# Patient Record
Sex: Male | Born: 1955 | Race: White | Hispanic: No | Marital: Single | Smoking: Current some day smoker
Health system: Southern US, Community
[De-identification: ages and names within clinical notes are randomized; demographics above are authoritative.]

## PROBLEM LIST (undated history)

## (undated) DIAGNOSIS — M1990 Unspecified osteoarthritis, unspecified site: Secondary | ICD-10-CM

## (undated) DIAGNOSIS — M6530 Trigger finger, unspecified finger: Secondary | ICD-10-CM

## (undated) DIAGNOSIS — K7460 Unspecified cirrhosis of liver: Secondary | ICD-10-CM

## (undated) DIAGNOSIS — B20 Human immunodeficiency virus [HIV] disease: Secondary | ICD-10-CM

## (undated) DIAGNOSIS — D649 Anemia, unspecified: Secondary | ICD-10-CM

## (undated) DIAGNOSIS — N400 Enlarged prostate without lower urinary tract symptoms: Secondary | ICD-10-CM

## (undated) DIAGNOSIS — N35919 Unspecified urethral stricture, male, unspecified site: Secondary | ICD-10-CM

## (undated) DIAGNOSIS — D61818 Other pancytopenia: Secondary | ICD-10-CM

## (undated) DIAGNOSIS — I10 Essential (primary) hypertension: Secondary | ICD-10-CM

## (undated) DIAGNOSIS — I272 Pulmonary hypertension, unspecified: Secondary | ICD-10-CM

## (undated) DIAGNOSIS — M792 Neuralgia and neuritis, unspecified: Secondary | ICD-10-CM

## (undated) DIAGNOSIS — Z21 Asymptomatic human immunodeficiency virus [HIV] infection status: Secondary | ICD-10-CM

## (undated) DIAGNOSIS — R0902 Hypoxemia: Secondary | ICD-10-CM

## (undated) DIAGNOSIS — B182 Chronic viral hepatitis C: Secondary | ICD-10-CM

## (undated) HISTORY — DX: Benign prostatic hyperplasia without lower urinary tract symptoms: N40.0

## (undated) HISTORY — DX: Anemia, unspecified: D64.9

## (undated) HISTORY — DX: Trigger finger, unspecified finger: M65.30

## (undated) HISTORY — DX: Unspecified cirrhosis of liver: K74.60

## (undated) HISTORY — DX: Essential (primary) hypertension: I10

## (undated) HISTORY — DX: Hypoxemia: R09.02

## (undated) HISTORY — DX: Neuralgia and neuritis, unspecified: M79.2

## (undated) HISTORY — DX: Unspecified urethral stricture, male, unspecified site: N35.919

## (undated) HISTORY — DX: Pulmonary hypertension, unspecified: I27.20

## (undated) HISTORY — DX: Unspecified osteoarthritis, unspecified site: M19.90

## (undated) HISTORY — PX: FRACTURE SURGERY: SHX138

## (undated) HISTORY — DX: Other pancytopenia: D61.818

---

## 2000-11-09 ENCOUNTER — Ambulatory Visit (HOSPITAL_COMMUNITY): Admission: RE | Admit: 2000-11-09 | Discharge: 2000-11-09 | Payer: Self-pay | Admitting: Internal Medicine

## 2000-11-09 ENCOUNTER — Encounter: Admission: RE | Admit: 2000-11-09 | Discharge: 2000-11-09 | Payer: Self-pay | Admitting: Internal Medicine

## 2000-11-20 ENCOUNTER — Encounter: Admission: RE | Admit: 2000-11-20 | Discharge: 2000-11-20 | Payer: Self-pay | Admitting: Infectious Diseases

## 2000-12-04 ENCOUNTER — Encounter: Admission: RE | Admit: 2000-12-04 | Discharge: 2000-12-04 | Payer: Self-pay | Admitting: Infectious Diseases

## 2000-12-04 ENCOUNTER — Ambulatory Visit (HOSPITAL_COMMUNITY): Admission: RE | Admit: 2000-12-04 | Discharge: 2000-12-04 | Payer: Self-pay | Admitting: Internal Medicine

## 2000-12-11 ENCOUNTER — Encounter: Admission: RE | Admit: 2000-12-11 | Discharge: 2000-12-11 | Payer: Self-pay | Admitting: Infectious Diseases

## 2000-12-11 ENCOUNTER — Encounter: Payer: Self-pay | Admitting: Infectious Diseases

## 2000-12-11 ENCOUNTER — Ambulatory Visit (HOSPITAL_COMMUNITY): Admission: RE | Admit: 2000-12-11 | Discharge: 2000-12-11 | Payer: Self-pay | Admitting: Infectious Diseases

## 2000-12-12 ENCOUNTER — Ambulatory Visit (HOSPITAL_COMMUNITY): Admission: RE | Admit: 2000-12-12 | Discharge: 2000-12-12 | Payer: Self-pay | Admitting: Infectious Diseases

## 2000-12-12 ENCOUNTER — Encounter: Payer: Self-pay | Admitting: Infectious Diseases

## 2001-01-02 ENCOUNTER — Ambulatory Visit (HOSPITAL_COMMUNITY): Admission: RE | Admit: 2001-01-02 | Discharge: 2001-01-02 | Payer: Self-pay | Admitting: Infectious Diseases

## 2001-01-02 ENCOUNTER — Encounter: Admission: RE | Admit: 2001-01-02 | Discharge: 2001-01-02 | Payer: Self-pay | Admitting: Infectious Diseases

## 2001-01-30 ENCOUNTER — Encounter: Admission: RE | Admit: 2001-01-30 | Discharge: 2001-01-30 | Payer: Self-pay | Admitting: Infectious Diseases

## 2001-02-08 ENCOUNTER — Ambulatory Visit (HOSPITAL_COMMUNITY): Admission: RE | Admit: 2001-02-08 | Discharge: 2001-02-08 | Payer: Self-pay | Admitting: Infectious Diseases

## 2001-04-10 ENCOUNTER — Encounter: Admission: RE | Admit: 2001-04-10 | Discharge: 2001-04-10 | Payer: Self-pay | Admitting: Infectious Diseases

## 2001-05-02 ENCOUNTER — Encounter: Admission: RE | Admit: 2001-05-02 | Discharge: 2001-05-02 | Payer: Self-pay | Admitting: Infectious Diseases

## 2001-05-02 ENCOUNTER — Ambulatory Visit (HOSPITAL_COMMUNITY): Admission: RE | Admit: 2001-05-02 | Discharge: 2001-05-02 | Payer: Self-pay | Admitting: Infectious Diseases

## 2001-05-20 ENCOUNTER — Encounter: Payer: Self-pay | Admitting: Emergency Medicine

## 2001-05-20 ENCOUNTER — Inpatient Hospital Stay (HOSPITAL_COMMUNITY): Admission: EM | Admit: 2001-05-20 | Discharge: 2001-05-20 | Payer: Self-pay | Admitting: Emergency Medicine

## 2001-07-02 ENCOUNTER — Encounter: Admission: RE | Admit: 2001-07-02 | Discharge: 2001-07-02 | Payer: Self-pay | Admitting: Infectious Diseases

## 2001-07-02 ENCOUNTER — Ambulatory Visit (HOSPITAL_COMMUNITY): Admission: RE | Admit: 2001-07-02 | Discharge: 2001-07-02 | Payer: Self-pay | Admitting: Infectious Diseases

## 2001-07-12 ENCOUNTER — Encounter: Admission: RE | Admit: 2001-07-12 | Discharge: 2001-07-12 | Payer: Self-pay | Admitting: Internal Medicine

## 2001-09-04 ENCOUNTER — Ambulatory Visit (HOSPITAL_COMMUNITY): Admission: RE | Admit: 2001-09-04 | Discharge: 2001-09-04 | Payer: Self-pay | Admitting: Internal Medicine

## 2001-09-04 ENCOUNTER — Encounter: Admission: RE | Admit: 2001-09-04 | Discharge: 2001-09-04 | Payer: Self-pay | Admitting: Infectious Diseases

## 2001-09-09 ENCOUNTER — Encounter: Payer: Self-pay | Admitting: Infectious Diseases

## 2001-09-09 ENCOUNTER — Ambulatory Visit (HOSPITAL_COMMUNITY): Admission: RE | Admit: 2001-09-09 | Discharge: 2001-09-09 | Payer: Self-pay | Admitting: Infectious Diseases

## 2001-10-18 ENCOUNTER — Encounter: Admission: RE | Admit: 2001-10-18 | Discharge: 2001-10-18 | Payer: Self-pay | Admitting: Infectious Diseases

## 2001-11-12 ENCOUNTER — Encounter: Admission: RE | Admit: 2001-11-12 | Discharge: 2001-11-12 | Payer: Self-pay | Admitting: Infectious Diseases

## 2001-11-12 ENCOUNTER — Ambulatory Visit (HOSPITAL_COMMUNITY): Admission: RE | Admit: 2001-11-12 | Discharge: 2001-11-12 | Payer: Self-pay | Admitting: Infectious Diseases

## 2002-01-15 ENCOUNTER — Encounter: Admission: RE | Admit: 2002-01-15 | Discharge: 2002-01-15 | Payer: Self-pay | Admitting: Infectious Diseases

## 2002-01-15 ENCOUNTER — Ambulatory Visit (HOSPITAL_COMMUNITY): Admission: RE | Admit: 2002-01-15 | Discharge: 2002-01-15 | Payer: Self-pay | Admitting: Infectious Diseases

## 2002-03-19 ENCOUNTER — Ambulatory Visit (HOSPITAL_COMMUNITY): Admission: RE | Admit: 2002-03-19 | Discharge: 2002-03-19 | Payer: Self-pay | Admitting: Infectious Diseases

## 2002-03-19 ENCOUNTER — Encounter: Admission: RE | Admit: 2002-03-19 | Discharge: 2002-03-19 | Payer: Self-pay | Admitting: Infectious Diseases

## 2002-05-19 ENCOUNTER — Encounter: Admission: RE | Admit: 2002-05-19 | Discharge: 2002-05-19 | Payer: Self-pay | Admitting: Infectious Diseases

## 2002-06-30 ENCOUNTER — Encounter: Admission: RE | Admit: 2002-06-30 | Discharge: 2002-06-30 | Payer: Self-pay | Admitting: Infectious Diseases

## 2002-06-30 ENCOUNTER — Ambulatory Visit (HOSPITAL_COMMUNITY): Admission: RE | Admit: 2002-06-30 | Discharge: 2002-06-30 | Payer: Self-pay | Admitting: Infectious Diseases

## 2002-08-23 ENCOUNTER — Emergency Department (HOSPITAL_COMMUNITY): Admission: EM | Admit: 2002-08-23 | Discharge: 2002-08-23 | Payer: Self-pay | Admitting: Emergency Medicine

## 2002-09-10 ENCOUNTER — Encounter: Admission: RE | Admit: 2002-09-10 | Discharge: 2002-09-10 | Payer: Self-pay | Admitting: Infectious Diseases

## 2002-11-10 ENCOUNTER — Encounter: Admission: RE | Admit: 2002-11-10 | Discharge: 2002-11-10 | Payer: Self-pay | Admitting: Infectious Diseases

## 2002-11-26 ENCOUNTER — Encounter: Admission: RE | Admit: 2002-11-26 | Discharge: 2002-11-26 | Payer: Self-pay | Admitting: Infectious Diseases

## 2003-02-04 ENCOUNTER — Ambulatory Visit (HOSPITAL_COMMUNITY): Admission: RE | Admit: 2003-02-04 | Discharge: 2003-02-04 | Payer: Self-pay | Admitting: Infectious Diseases

## 2003-02-04 ENCOUNTER — Encounter: Admission: RE | Admit: 2003-02-04 | Discharge: 2003-02-04 | Payer: Self-pay | Admitting: Infectious Diseases

## 2003-05-06 ENCOUNTER — Encounter: Admission: RE | Admit: 2003-05-06 | Discharge: 2003-05-06 | Payer: Self-pay | Admitting: Infectious Diseases

## 2015-06-24 ENCOUNTER — Telehealth: Payer: Self-pay | Admitting: Family Medicine

## 2015-06-24 NOTE — Telephone Encounter (Signed)
The patient will have to be seen by a provider before any prescriptions can be given or refilled. Please get the patient in with a provider and then appropriate prescriptions can be written. He needs to be seen by someone who can follow him regularly

## 2015-06-24 NOTE — Telephone Encounter (Signed)
Apt made
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
## 2015-06-25 ENCOUNTER — Encounter: Payer: Self-pay | Admitting: Pediatrics

## 2015-06-25 ENCOUNTER — Ambulatory Visit (INDEPENDENT_AMBULATORY_CARE_PROVIDER_SITE_OTHER): Payer: Self-pay | Admitting: Pediatrics

## 2015-06-25 VITALS — BP 118/80 | HR 82 | Temp 98.0°F | Ht 66.0 in | Wt 160.0 lb

## 2015-06-25 DIAGNOSIS — K7460 Unspecified cirrhosis of liver: Secondary | ICD-10-CM

## 2015-06-25 DIAGNOSIS — B20 Human immunodeficiency virus [HIV] disease: Secondary | ICD-10-CM

## 2015-06-25 DIAGNOSIS — N400 Enlarged prostate without lower urinary tract symptoms: Secondary | ICD-10-CM

## 2015-06-25 DIAGNOSIS — I272 Other secondary pulmonary hypertension: Secondary | ICD-10-CM

## 2015-06-25 DIAGNOSIS — G8929 Other chronic pain: Secondary | ICD-10-CM

## 2015-06-25 DIAGNOSIS — B1920 Unspecified viral hepatitis C without hepatic coma: Secondary | ICD-10-CM

## 2015-06-25 DIAGNOSIS — F1129 Opioid dependence with unspecified opioid-induced disorder: Secondary | ICD-10-CM

## 2015-06-25 MED ORDER — GABAPENTIN 300 MG PO CAPS: 600.0000 mg | ORAL_CAPSULE | Freq: Three times a day (TID) | ORAL | Status: DC

## 2015-06-25 MED ORDER — SILDENAFIL CITRATE 20 MG PO TABS: 40.0000 mg | ORAL_TABLET | Freq: Three times a day (TID) | ORAL | Status: DC

## 2015-06-25 MED ORDER — TAMSULOSIN HCL 0.4 MG PO CAPS: 0.4000 mg | ORAL_CAPSULE | Freq: Every day | ORAL | Status: DC

## 2015-06-25 MED ORDER — ABACAVIR-DOLUTEGRAVIR-LAMIVUD 600-50-300 MG PO TABS: 1.0000 | ORAL_TABLET | Freq: Every day | ORAL | Status: DC

## 2015-06-25 MED ORDER — OXYCODONE HCL 10 MG PO TABS: 10.0000 mg | ORAL_TABLET | Freq: Three times a day (TID) | ORAL | Status: DC | PRN

## 2015-06-25 NOTE — Patient Instructions (Signed)
Go to the health department for HIV meds
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
## 2015-06-26 DIAGNOSIS — B20 Human immunodeficiency virus [HIV] disease: Secondary | ICD-10-CM | POA: Insufficient documentation

## 2015-06-26 DIAGNOSIS — N400 Enlarged prostate without lower urinary tract symptoms: Secondary | ICD-10-CM | POA: Insufficient documentation

## 2015-06-26 DIAGNOSIS — B199 Unspecified viral hepatitis without hepatic coma: Secondary | ICD-10-CM | POA: Insufficient documentation

## 2015-06-26 DIAGNOSIS — F1129 Opioid dependence with unspecified opioid-induced disorder: Secondary | ICD-10-CM | POA: Insufficient documentation

## 2015-06-26 DIAGNOSIS — K7460 Unspecified cirrhosis of liver: Secondary | ICD-10-CM | POA: Insufficient documentation

## 2015-06-26 DIAGNOSIS — I272 Pulmonary hypertension, unspecified: Secondary | ICD-10-CM | POA: Insufficient documentation

## 2015-06-26 DIAGNOSIS — G8929 Other chronic pain: Secondary | ICD-10-CM | POA: Insufficient documentation

## 2015-07-22 ENCOUNTER — Ambulatory Visit (INDEPENDENT_AMBULATORY_CARE_PROVIDER_SITE_OTHER): Payer: Self-pay | Admitting: Pediatrics

## 2015-07-22 ENCOUNTER — Encounter: Payer: Self-pay | Admitting: Pediatrics

## 2015-07-22 VITALS — BP 119/77 | HR 85 | Temp 97.1°F | Ht 66.0 in | Wt 160.2 lb

## 2015-07-22 DIAGNOSIS — I272 Other secondary pulmonary hypertension: Secondary | ICD-10-CM

## 2015-07-22 DIAGNOSIS — F1129 Opioid dependence with unspecified opioid-induced disorder: Secondary | ICD-10-CM

## 2015-07-22 MED ORDER — SILDENAFIL CITRATE 20 MG PO TABS: 20.0000 mg | ORAL_TABLET | Freq: Three times a day (TID) | ORAL | Status: DC

## 2015-07-22 MED ORDER — OXYCODONE HCL 10 MG PO TABS: 10.0000 mg | ORAL_TABLET | Freq: Three times a day (TID) | ORAL | Status: DC | PRN
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
## 2015-07-23 ENCOUNTER — Telehealth: Payer: Self-pay | Admitting: Pediatrics

## 2015-07-23 ENCOUNTER — Institutional Professional Consult (permissible substitution): Payer: Self-pay | Admitting: Internal Medicine

## 2015-07-24 NOTE — Telephone Encounter (Signed)
Tried to get through to person, phone kept ringing, no one picked up.

## 2015-08-19 ENCOUNTER — Ambulatory Visit (INDEPENDENT_AMBULATORY_CARE_PROVIDER_SITE_OTHER): Payer: Self-pay | Admitting: Pediatrics

## 2015-08-19 ENCOUNTER — Encounter: Payer: Self-pay | Admitting: Pediatrics

## 2015-08-19 VITALS — BP 128/81 | HR 86 | Temp 97.2°F | Ht 66.0 in | Wt 155.0 lb

## 2015-08-19 DIAGNOSIS — I272 Other secondary pulmonary hypertension: Secondary | ICD-10-CM

## 2015-08-19 DIAGNOSIS — F1129 Opioid dependence with unspecified opioid-induced disorder: Secondary | ICD-10-CM

## 2015-08-19 DIAGNOSIS — B20 Human immunodeficiency virus [HIV] disease: Secondary | ICD-10-CM

## 2015-08-19 MED ORDER — OXYCODONE HCL 10 MG PO TABS: 10.0000 mg | ORAL_TABLET | Freq: Three times a day (TID) | ORAL | Status: DC | PRN

## 2015-08-19 MED ORDER — SILDENAFIL CITRATE 20 MG PO TABS: 20.0000 mg | ORAL_TABLET | Freq: Three times a day (TID) | ORAL | Status: DC
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
## 2015-08-20 ENCOUNTER — Other Ambulatory Visit: Payer: Self-pay | Admitting: Pediatrics

## 2015-08-20 NOTE — Telephone Encounter (Signed)
Coupon card left up front in brown bag where samples are

## 2015-08-25 ENCOUNTER — Telehealth: Payer: Self-pay | Admitting: *Deleted

## 2015-08-25 NOTE — Telephone Encounter (Signed)
Called the patient to try and schedule a transfere appt for labs and to see the doctor had to leave a message as he was unavailable. Will schedule when he calls back.

## 2015-09-02 ENCOUNTER — Telehealth: Payer: Self-pay | Admitting: Pediatrics

## 2015-09-02 NOTE — Telephone Encounter (Signed)
Spoke with patient assistance program for meds of Casey Pennington, gave them a medicine list to see what assistance is available to pt. Saw phone message from ID clinic, called pt, gave him ID clinic phone number, says he never got a message from them. Best contact [phone].

## 2015-09-03 NOTE — Telephone Encounter (Signed)
Several attempts have been made to contact patient.

## 2015-09-13 ENCOUNTER — Encounter: Payer: Self-pay | Admitting: Pediatrics

## 2015-09-13 ENCOUNTER — Ambulatory Visit (INDEPENDENT_AMBULATORY_CARE_PROVIDER_SITE_OTHER): Payer: Medicaid Other | Admitting: Pediatrics

## 2015-09-13 VITALS — BP 128/88 | HR 92 | Temp 97.3°F | Ht 66.0 in | Wt 150.4 lb

## 2015-09-13 DIAGNOSIS — F1129 Opioid dependence with unspecified opioid-induced disorder: Secondary | ICD-10-CM | POA: Diagnosis not present

## 2015-09-13 DIAGNOSIS — I272 Other secondary pulmonary hypertension: Secondary | ICD-10-CM

## 2015-09-13 DIAGNOSIS — G894 Chronic pain syndrome: Secondary | ICD-10-CM | POA: Diagnosis not present

## 2015-09-13 DIAGNOSIS — N400 Enlarged prostate without lower urinary tract symptoms: Secondary | ICD-10-CM

## 2015-09-13 DIAGNOSIS — B20 Human immunodeficiency virus [HIV] disease: Secondary | ICD-10-CM

## 2015-09-13 LAB — BAYER DCA HB A1C WAIVED: HB A1C (BAYER DCA - WAIVED): 5.4 % (ref <7.0)

## 2015-09-13 MED ORDER — GABAPENTIN 600 MG PO TABS: 600.0000 mg | ORAL_TABLET | Freq: Three times a day (TID) | ORAL | Status: DC

## 2015-09-13 MED ORDER — OXYCODONE HCL 10 MG PO TABS: 10.0000 mg | ORAL_TABLET | Freq: Three times a day (TID) | ORAL | Status: DC | PRN

## 2015-09-13 MED ORDER — BUPROPION HCL ER (XL) 150 MG PO TB24: 150.0000 mg | ORAL_TABLET | Freq: Every day | ORAL | Status: AC

## 2015-09-13 MED ORDER — SILDENAFIL CITRATE 20 MG PO TABS: 20.0000 mg | ORAL_TABLET | Freq: Three times a day (TID) | ORAL | Status: DC

## 2015-09-13 MED ORDER — ABACAVIR-DOLUTEGRAVIR-LAMIVUD 600-50-300 MG PO TABS: 1.0000 | ORAL_TABLET | Freq: Every day | ORAL | Status: AC

## 2015-09-13 MED ORDER — TAMSULOSIN HCL 0.4 MG PO CAPS: 0.4000 mg | ORAL_CAPSULE | Freq: Every day | ORAL | Status: AC
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
## 2015-09-14 ENCOUNTER — Telehealth: Payer: Self-pay

## 2015-09-14 DIAGNOSIS — G8929 Other chronic pain: Secondary | ICD-10-CM

## 2015-09-14 NOTE — Telephone Encounter (Signed)
Medicaid non preferred Gabapentin   Preferred are Clonazepam , Diastar, Gabapentin capsule Gabitril, lamotrigine chewable levetiracetam tablet, Topiragen tablet topiramate asule ot tablet and zonisamide capsule

## 2015-09-15 ENCOUNTER — Telehealth: Payer: Self-pay | Admitting: Pediatrics

## 2015-09-15 DIAGNOSIS — I272 Pulmonary hypertension, unspecified: Secondary | ICD-10-CM

## 2015-09-15 DIAGNOSIS — B20 Human immunodeficiency virus [HIV] disease: Secondary | ICD-10-CM

## 2015-09-15 DIAGNOSIS — K7460 Unspecified cirrhosis of liver: Secondary | ICD-10-CM

## 2015-09-15 LAB — CMP14+EGFR
ALT: 11 IU/L (ref 0–44)
AST: 23 IU/L (ref 0–40)
Albumin/Globulin Ratio: 0.9 — ABNORMAL LOW (ref 1.2–2.2)
Albumin: 4.7 g/dL (ref 3.5–5.5)
Alkaline Phosphatase: 102 IU/L (ref 39–117)
BILIRUBIN TOTAL: 0.6 mg/dL (ref 0.0–1.2)
BUN/Creatinine Ratio: 15 (ref 9–20)
BUN: 16 mg/dL (ref 6–24)
CALCIUM: 9.3 mg/dL (ref 8.7–10.2)
CO2: 16 mmol/L — ABNORMAL LOW (ref 18–29)
Chloride: 107 mmol/L — ABNORMAL HIGH (ref 96–106)
Creatinine, Ser: 1.09 mg/dL (ref 0.76–1.27)
GFR, EST AFRICAN AMERICAN: 86 mL/min/{1.73_m2} (ref >59)
GFR, EST NON AFRICAN AMERICAN: 74 mL/min/{1.73_m2} (ref >59)
GLUCOSE: 100 mg/dL — AB (ref 65–99)
Globulin, Total: 5.3 g/dL — ABNORMAL HIGH (ref 1.5–4.5)
POTASSIUM: 4.9 mmol/L (ref 3.5–5.2)
Sodium: 144 mmol/L (ref 134–144)
TOTAL PROTEIN: 10 g/dL — AB (ref 6.0–8.5)

## 2015-09-15 LAB — CBC
HEMATOCRIT: 42.9 % (ref 37.5–51.0)
HEMOGLOBIN: 14.5 g/dL (ref 12.6–17.7)
MCH: 30.1 pg (ref 26.6–33.0)
MCHC: 33.8 g/dL (ref 31.5–35.7)
MCV: 89 fL (ref 79–97)
PLATELETS: 90 10*3/uL — AB (ref 150–379)
RBC: 4.82 x10E6/uL (ref 4.14–5.80)
RDW: 16.1 % — ABNORMAL HIGH (ref 12.3–15.4)
WBC: 4.4 10*3/uL (ref 3.4–10.8)

## 2015-09-15 LAB — RNA, REAL TIME PCR (GRAPH)
HIV 1 RNA VIRAL LOAD LOG: 5.283 {Log_copies}/mL
HIV-1 RNA Viral Load: 192000 copies/mL

## 2015-09-15 LAB — TSH: TSH: 1.41 u[IU]/mL (ref 0.450–4.500)

## 2015-09-15 MED ORDER — GABAPENTIN 300 MG PO CAPS: 600.0000 mg | ORAL_CAPSULE | Freq: Three times a day (TID) | ORAL | Status: DC

## 2015-09-15 NOTE — Telephone Encounter (Signed)
Pt coming tomorrow for repeat attempt at CD4 count, cancelled at last visit. Records without recent ECHO results for pulm HTN, ordered. Records discuss ESLD 2/2 hep C, h/o varices. Will need repeat walking O2 tomorrow while in clinic.

## 2015-09-15 NOTE — Telephone Encounter (Signed)
D/c'ed tablet gabapentin, put in capsule gabapentin per medicaid preferences.

## 2015-09-16 ENCOUNTER — Ambulatory Visit: Payer: Self-pay | Admitting: Pediatrics

## 2015-09-16 ENCOUNTER — Other Ambulatory Visit: Payer: Self-pay | Admitting: *Deleted

## 2015-09-16 ENCOUNTER — Other Ambulatory Visit: Payer: Self-pay | Admitting: Pediatrics

## 2015-09-16 ENCOUNTER — Other Ambulatory Visit: Payer: Medicaid Other

## 2015-09-16 DIAGNOSIS — B20 Human immunodeficiency virus [HIV] disease: Secondary | ICD-10-CM

## 2015-09-16 DIAGNOSIS — K7460 Unspecified cirrhosis of liver: Secondary | ICD-10-CM

## 2015-09-16 NOTE — Progress Notes (Unsigned)
Walking O2 sats stayed 95-97%. Now on sildenafil 98m TID.

## 2015-09-17 LAB — T-HELPER CELLS (CD4) COUNT (NOT AT ARMC)
% CD 4 POS. LYMPH.: 10.8 % — AB (ref 30.8–58.5)
ABSOLUTE CD 4 HELPER: 248 /uL — AB (ref 359–1519)
BASOS ABS: 0 10*3/uL (ref 0.0–0.2)
BASOS: 0 %
EOS (ABSOLUTE): 0.1 10*3/uL (ref 0.0–0.4)
Eos: 1 %
Hematocrit: 42.6 % (ref 37.5–51.0)
Hemoglobin: 14.4 g/dL (ref 12.6–17.7)
IMMATURE GRANS (ABS): 0 10*3/uL (ref 0.0–0.1)
IMMATURE GRANULOCYTES: 0 %
LYMPHS: 38 %
Lymphocytes Absolute: 2.3 10*3/uL (ref 0.7–3.1)
MCH: 30.6 pg (ref 26.6–33.0)
MCHC: 33.8 g/dL (ref 31.5–35.7)
MCV: 90 fL (ref 79–97)
MONOS ABS: 0.5 10*3/uL (ref 0.1–0.9)
Monocytes: 8 %
NEUTROS PCT: 53 %
Neutrophils Absolute: 3.2 10*3/uL (ref 1.4–7.0)
PLATELETS: 87 10*3/uL — AB (ref 150–379)
RBC: 4.71 x10E6/uL (ref 4.14–5.80)
RDW: 16.3 % — AB (ref 12.3–15.4)
WBC: 6 10*3/uL (ref 3.4–10.8)

## 2015-09-17 LAB — PROTIME-INR
INR: 1.2 (ref 0.8–1.2)
PROTHROMBIN TIME: 12.3 s — AB (ref 9.1–12.0)

## 2015-10-14 ENCOUNTER — Encounter: Payer: Self-pay | Admitting: Family Medicine

## 2015-10-14 ENCOUNTER — Ambulatory Visit: Payer: Medicaid Other | Admitting: Family Medicine

## 2015-10-14 ENCOUNTER — Ambulatory Visit (INDEPENDENT_AMBULATORY_CARE_PROVIDER_SITE_OTHER): Payer: Medicaid Other | Admitting: Family Medicine

## 2015-10-14 ENCOUNTER — Encounter: Payer: Self-pay | Admitting: Pediatrics

## 2015-10-14 VITALS — BP 132/79 | HR 93 | Temp 97.7°F | Ht 67.5 in | Wt 152.2 lb

## 2015-10-14 DIAGNOSIS — I272 Other secondary pulmonary hypertension: Secondary | ICD-10-CM | POA: Diagnosis not present

## 2015-10-14 DIAGNOSIS — B20 Human immunodeficiency virus [HIV] disease: Secondary | ICD-10-CM | POA: Diagnosis not present

## 2015-10-14 DIAGNOSIS — G8929 Other chronic pain: Secondary | ICD-10-CM | POA: Diagnosis not present

## 2015-10-14 DIAGNOSIS — F1129 Opioid dependence with unspecified opioid-induced disorder: Secondary | ICD-10-CM | POA: Diagnosis not present

## 2015-10-14 MED ORDER — OXYCODONE HCL 10 MG PO TABS: 10.0000 mg | ORAL_TABLET | Freq: Three times a day (TID) | ORAL | Status: AC | PRN

## 2015-10-14 MED ORDER — SILDENAFIL CITRATE 20 MG PO TABS: 40.0000 mg | ORAL_TABLET | Freq: Three times a day (TID) | ORAL | Status: AC
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
## 2015-10-14 NOTE — Patient Instructions (Signed)
Great to meet you!  We will cal with labs in less than 1 week  We will work on the pulmonology arrangements in [place] back in 1 month

## 2015-10-15 LAB — CMP14+EGFR
ALK PHOS: 122 IU/L — AB (ref 39–117)
ALT: 19 IU/L (ref 0–44)
AST: 33 IU/L (ref 0–40)
Albumin/Globulin Ratio: 0.8 — ABNORMAL LOW (ref 1.2–2.2)
Albumin: 4.1 g/dL (ref 3.5–5.5)
BILIRUBIN TOTAL: 0.7 mg/dL (ref 0.0–1.2)
BUN / CREAT RATIO: 9 (ref 9–20)
BUN: 10 mg/dL (ref 6–24)
CHLORIDE: 104 mmol/L (ref 96–106)
CO2: 16 mmol/L — AB (ref 18–29)
CREATININE: 1.08 mg/dL (ref 0.76–1.27)
Calcium: 9 mg/dL (ref 8.7–10.2)
GFR calc Af Amer: 87 mL/min/{1.73_m2} (ref >59)
GFR calc non Af Amer: 75 mL/min/{1.73_m2} (ref >59)
GLOBULIN, TOTAL: 4.9 g/dL — AB (ref 1.5–4.5)
GLUCOSE: 111 mg/dL — AB (ref 65–99)
Potassium: 4.2 mmol/L (ref 3.5–5.2)
SODIUM: 141 mmol/L (ref 134–144)
Total Protein: 9 g/dL — ABNORMAL HIGH (ref 6.0–8.5)

## 2015-10-15 LAB — CBC WITH DIFFERENTIAL/PLATELET
BASOS ABS: 0 10*3/uL (ref 0.0–0.2)
Basos: 0 %
EOS (ABSOLUTE): 0.1 10*3/uL (ref 0.0–0.4)
Eos: 2 %
HEMATOCRIT: 38.5 % (ref 37.5–51.0)
Hemoglobin: 13.1 g/dL (ref 12.6–17.7)
Immature Grans (Abs): 0 10*3/uL (ref 0.0–0.1)
Immature Granulocytes: 0 %
LYMPHS ABS: 1.1 10*3/uL (ref 0.7–3.1)
LYMPHS: 24 %
MCH: 31 pg (ref 26.6–33.0)
MCHC: 34 g/dL (ref 31.5–35.7)
MCV: 91 fL (ref 79–97)
MONOS ABS: 0.4 10*3/uL (ref 0.1–0.9)
Monocytes: 8 %
NEUTROS ABS: 2.9 10*3/uL (ref 1.4–7.0)
Neutrophils: 66 %
PLATELETS: 84 10*3/uL — AB (ref 150–379)
RBC: 4.23 x10E6/uL (ref 4.14–5.80)
RDW: 17.8 % — AB (ref 12.3–15.4)
WBC: 4.5 10*3/uL (ref 3.4–10.8)

## 2015-10-21 LAB — TOXASSURE SELECT 13 (MW), URINE: PDF: 0

## 2015-10-26 ENCOUNTER — Telehealth: Payer: Self-pay | Admitting: Family Medicine

## 2015-10-26 NOTE — Telephone Encounter (Signed)
Called and discussed with patient.  I explained that because he has THC, Xanax, and no oxycodone in his urine that we will not be what to prescribe opiates to him any longer.  He was just called by pain management today and offered an appointment 3 days from now. He was not going to go because it's in [place], I have encouraged him to try to find a ride because there are no additional pain clinics in the surrounding towns, he has to go to [place], [place], or [place].  We did previously have one in [place] which is closing.  He understands.  He states he used one of his mother's Xanax is whenever his father had a psychotic episode and he was trying to deal with the stress. He explains that he was exposed to marijuana, does not admit to using, at his nephew's birthday party  Laroy Apple, MD [facility] 10/26/2015, 4:01 PM

## 2015-10-26 NOTE — Telephone Encounter (Signed)
Called to discuss drug screen, however his father explains that he is not out of bed yet.  He recommends calling back after 2:00.  I explained his father is his turn to call his son, did not explain the topic or wire was calling.  Will attempt again.  Laroy Apple, MD [facility] 10/26/2015, 12:07 PM

## 2015-11-01 ENCOUNTER — Ambulatory Visit: Payer: Self-pay | Admitting: Infectious Disease

## 2015-11-08 ENCOUNTER — Other Ambulatory Visit: Payer: Self-pay | Admitting: Pediatrics

## 2015-11-09 ENCOUNTER — Ambulatory Visit: Payer: Medicaid Other | Admitting: Family Medicine

## 2015-11-10 ENCOUNTER — Emergency Department (HOSPITAL_COMMUNITY): Payer: Medicaid Other

## 2015-11-10 ENCOUNTER — Encounter (HOSPITAL_COMMUNITY): Payer: Self-pay | Admitting: Emergency Medicine

## 2015-11-10 ENCOUNTER — Inpatient Hospital Stay (HOSPITAL_COMMUNITY)
Admission: EM | Admit: 2015-11-10 | Discharge: 2015-11-25 | DRG: 969 | Disposition: E | Payer: Medicaid Other | Attending: Internal Medicine | Admitting: Internal Medicine

## 2015-11-10 ENCOUNTER — Encounter: Payer: Self-pay | Admitting: Pediatrics

## 2015-11-10 DIAGNOSIS — Z79899 Other long term (current) drug therapy: Secondary | ICD-10-CM

## 2015-11-10 DIAGNOSIS — B9620 Unspecified Escherichia coli [E. coli] as the cause of diseases classified elsewhere: Secondary | ICD-10-CM | POA: Diagnosis present

## 2015-11-10 DIAGNOSIS — Z8619 Personal history of other infectious and parasitic diseases: Secondary | ICD-10-CM | POA: Diagnosis not present

## 2015-11-10 DIAGNOSIS — R162 Hepatomegaly with splenomegaly, not elsewhere classified: Secondary | ICD-10-CM | POA: Diagnosis present

## 2015-11-10 DIAGNOSIS — D696 Thrombocytopenia, unspecified: Secondary | ICD-10-CM | POA: Diagnosis present

## 2015-11-10 DIAGNOSIS — R64 Cachexia: Secondary | ICD-10-CM | POA: Diagnosis present

## 2015-11-10 DIAGNOSIS — Z21 Asymptomatic human immunodeficiency virus [HIV] infection status: Secondary | ICD-10-CM | POA: Diagnosis not present

## 2015-11-10 DIAGNOSIS — B182 Chronic viral hepatitis C: Secondary | ICD-10-CM | POA: Diagnosis present

## 2015-11-10 DIAGNOSIS — Z23 Encounter for immunization: Secondary | ICD-10-CM | POA: Diagnosis not present

## 2015-11-10 DIAGNOSIS — R6521 Severe sepsis with septic shock: Secondary | ICD-10-CM | POA: Diagnosis present

## 2015-11-10 DIAGNOSIS — K6389 Other specified diseases of intestine: Secondary | ICD-10-CM | POA: Diagnosis present

## 2015-11-10 DIAGNOSIS — F1110 Opioid abuse, uncomplicated: Secondary | ICD-10-CM | POA: Diagnosis present

## 2015-11-10 DIAGNOSIS — R338 Other retention of urine: Secondary | ICD-10-CM | POA: Diagnosis present

## 2015-11-10 DIAGNOSIS — G92 Toxic encephalopathy: Secondary | ICD-10-CM | POA: Diagnosis present

## 2015-11-10 DIAGNOSIS — A4102 Sepsis due to Methicillin resistant Staphylococcus aureus: Secondary | ICD-10-CM | POA: Diagnosis not present

## 2015-11-10 DIAGNOSIS — R739 Hyperglycemia, unspecified: Secondary | ICD-10-CM | POA: Diagnosis present

## 2015-11-10 DIAGNOSIS — N179 Acute kidney failure, unspecified: Secondary | ICD-10-CM | POA: Insufficient documentation

## 2015-11-10 DIAGNOSIS — Z452 Encounter for adjustment and management of vascular access device: Secondary | ICD-10-CM

## 2015-11-10 DIAGNOSIS — R509 Fever, unspecified: Secondary | ICD-10-CM

## 2015-11-10 DIAGNOSIS — K5700 Diverticulitis of small intestine with perforation and abscess without bleeding: Secondary | ICD-10-CM | POA: Diagnosis present

## 2015-11-10 DIAGNOSIS — K7460 Unspecified cirrhosis of liver: Secondary | ICD-10-CM | POA: Diagnosis present

## 2015-11-10 DIAGNOSIS — R740 Nonspecific elevation of levels of transaminase and lactic acid dehydrogenase [LDH]: Secondary | ICD-10-CM | POA: Diagnosis present

## 2015-11-10 DIAGNOSIS — I214 Non-ST elevation (NSTEMI) myocardial infarction: Secondary | ICD-10-CM | POA: Diagnosis not present

## 2015-11-10 DIAGNOSIS — D689 Coagulation defect, unspecified: Secondary | ICD-10-CM | POA: Diagnosis present

## 2015-11-10 DIAGNOSIS — J9690 Respiratory failure, unspecified, unspecified whether with hypoxia or hypercapnia: Secondary | ICD-10-CM

## 2015-11-10 DIAGNOSIS — K559 Vascular disorder of intestine, unspecified: Secondary | ICD-10-CM | POA: Diagnosis present

## 2015-11-10 DIAGNOSIS — N400 Enlarged prostate without lower urinary tract symptoms: Secondary | ICD-10-CM | POA: Diagnosis present

## 2015-11-10 DIAGNOSIS — Z8679 Personal history of other diseases of the circulatory system: Secondary | ICD-10-CM | POA: Insufficient documentation

## 2015-11-10 DIAGNOSIS — R7981 Abnormal blood-gas level: Secondary | ICD-10-CM

## 2015-11-10 DIAGNOSIS — G9340 Encephalopathy, unspecified: Secondary | ICD-10-CM | POA: Insufficient documentation

## 2015-11-10 DIAGNOSIS — Z66 Do not resuscitate: Secondary | ICD-10-CM | POA: Diagnosis present

## 2015-11-10 DIAGNOSIS — R4182 Altered mental status, unspecified: Secondary | ICD-10-CM | POA: Diagnosis present

## 2015-11-10 DIAGNOSIS — F14129 Cocaine abuse with intoxication, unspecified: Secondary | ICD-10-CM | POA: Diagnosis present

## 2015-11-10 DIAGNOSIS — M6282 Rhabdomyolysis: Secondary | ICD-10-CM | POA: Diagnosis present

## 2015-11-10 DIAGNOSIS — R0600 Dyspnea, unspecified: Secondary | ICD-10-CM | POA: Diagnosis not present

## 2015-11-10 DIAGNOSIS — D751 Secondary polycythemia: Secondary | ICD-10-CM | POA: Diagnosis present

## 2015-11-10 DIAGNOSIS — N401 Enlarged prostate with lower urinary tract symptoms: Secondary | ICD-10-CM | POA: Diagnosis present

## 2015-11-10 DIAGNOSIS — F1910 Other psychoactive substance abuse, uncomplicated: Secondary | ICD-10-CM

## 2015-11-10 DIAGNOSIS — I272 Other secondary pulmonary hypertension: Secondary | ICD-10-CM | POA: Diagnosis present

## 2015-11-10 DIAGNOSIS — G8929 Other chronic pain: Secondary | ICD-10-CM | POA: Diagnosis present

## 2015-11-10 DIAGNOSIS — D649 Anemia, unspecified: Secondary | ICD-10-CM | POA: Diagnosis present

## 2015-11-10 DIAGNOSIS — E872 Acidosis: Secondary | ICD-10-CM | POA: Diagnosis present

## 2015-11-10 DIAGNOSIS — K55029 Acute infarction of small intestine, extent unspecified: Secondary | ICD-10-CM

## 2015-11-10 DIAGNOSIS — N390 Urinary tract infection, site not specified: Secondary | ICD-10-CM | POA: Diagnosis present

## 2015-11-10 DIAGNOSIS — Z515 Encounter for palliative care: Secondary | ICD-10-CM | POA: Diagnosis present

## 2015-11-10 DIAGNOSIS — F15129 Other stimulant abuse with intoxication, unspecified: Secondary | ICD-10-CM | POA: Diagnosis present

## 2015-11-10 DIAGNOSIS — R1110 Vomiting, unspecified: Secondary | ICD-10-CM

## 2015-11-10 DIAGNOSIS — E46 Unspecified protein-calorie malnutrition: Secondary | ICD-10-CM | POA: Diagnosis present

## 2015-11-10 DIAGNOSIS — T50905A Adverse effect of unspecified drugs, medicaments and biological substances, initial encounter: Secondary | ICD-10-CM | POA: Diagnosis present

## 2015-11-10 DIAGNOSIS — R451 Restlessness and agitation: Secondary | ICD-10-CM | POA: Diagnosis present

## 2015-11-10 DIAGNOSIS — T796XXD Traumatic ischemia of muscle, subsequent encounter: Secondary | ICD-10-CM | POA: Diagnosis not present

## 2015-11-10 DIAGNOSIS — J9601 Acute respiratory failure with hypoxia: Secondary | ICD-10-CM | POA: Diagnosis not present

## 2015-11-10 DIAGNOSIS — I10 Essential (primary) hypertension: Secondary | ICD-10-CM | POA: Diagnosis present

## 2015-11-10 DIAGNOSIS — Z6820 Body mass index (BMI) 20.0-20.9, adult: Secondary | ICD-10-CM

## 2015-11-10 DIAGNOSIS — K55019 Acute (reversible) ischemia of small intestine, extent unspecified: Secondary | ICD-10-CM | POA: Diagnosis present

## 2015-11-10 DIAGNOSIS — K625 Hemorrhage of anus and rectum: Secondary | ICD-10-CM | POA: Diagnosis not present

## 2015-11-10 DIAGNOSIS — F17210 Nicotine dependence, cigarettes, uncomplicated: Secondary | ICD-10-CM | POA: Diagnosis present

## 2015-11-10 DIAGNOSIS — R34 Anuria and oliguria: Secondary | ICD-10-CM | POA: Diagnosis not present

## 2015-11-10 DIAGNOSIS — I452 Bifascicular block: Secondary | ICD-10-CM | POA: Diagnosis present

## 2015-11-10 DIAGNOSIS — F19929 Other psychoactive substance use, unspecified with intoxication, unspecified: Secondary | ICD-10-CM | POA: Diagnosis present

## 2015-11-10 DIAGNOSIS — J9600 Acute respiratory failure, unspecified whether with hypoxia or hypercapnia: Secondary | ICD-10-CM

## 2015-11-10 DIAGNOSIS — K567 Ileus, unspecified: Secondary | ICD-10-CM | POA: Diagnosis present

## 2015-11-10 DIAGNOSIS — B20 Human immunodeficiency virus [HIV] disease: Secondary | ICD-10-CM | POA: Diagnosis present

## 2015-11-10 DIAGNOSIS — F19922 Other psychoactive substance use, unspecified with intoxication with perceptual disturbance: Secondary | ICD-10-CM

## 2015-11-10 DIAGNOSIS — K7030 Alcoholic cirrhosis of liver without ascites: Secondary | ICD-10-CM | POA: Diagnosis not present

## 2015-11-10 DIAGNOSIS — A419 Sepsis, unspecified organism: Secondary | ICD-10-CM | POA: Diagnosis present

## 2015-11-10 HISTORY — DX: Chronic viral hepatitis C: B18.2

## 2015-11-10 HISTORY — DX: Asymptomatic human immunodeficiency virus (hiv) infection status: Z21

## 2015-11-10 HISTORY — DX: Human immunodeficiency virus (HIV) disease: B20

## 2015-11-10 LAB — CBC
HEMATOCRIT: 54.1 % — AB (ref 39.0–52.0)
HEMOGLOBIN: 18.3 g/dL — AB (ref 13.0–17.0)
MCH: 33.4 pg (ref 26.0–34.0)
MCHC: 33.8 g/dL (ref 30.0–36.0)
MCV: 98.7 fL (ref 78.0–100.0)
Platelets: 122 10*3/uL — ABNORMAL LOW (ref 150–400)
RBC: 5.48 MIL/uL (ref 4.22–5.81)
RDW: 17.2 % — ABNORMAL HIGH (ref 11.5–15.5)
WBC: 19.9 10*3/uL — AB (ref 4.0–10.5)

## 2015-11-10 LAB — HEPATIC FUNCTION PANEL
ALT: 359 U/L — AB (ref 17–63)
AST: 1554 U/L — ABNORMAL HIGH (ref 15–41)
Albumin: 4.1 g/dL (ref 3.5–5.0)
Alkaline Phosphatase: 116 U/L (ref 38–126)
BILIRUBIN INDIRECT: 1.3 mg/dL — AB (ref 0.3–0.9)
Bilirubin, Direct: 0.4 mg/dL (ref 0.1–0.5)
TOTAL PROTEIN: 9 g/dL — AB (ref 6.5–8.1)
Total Bilirubin: 1.7 mg/dL — ABNORMAL HIGH (ref 0.3–1.2)

## 2015-11-10 LAB — RAPID URINE DRUG SCREEN, HOSP PERFORMED
Amphetamines: POSITIVE — AB
Barbiturates: NOT DETECTED
Benzodiazepines: POSITIVE — AB
COCAINE: NOT DETECTED
OPIATES: NOT DETECTED
TETRAHYDROCANNABINOL: POSITIVE — AB

## 2015-11-10 LAB — ETHANOL

## 2015-11-10 LAB — URINALYSIS, ROUTINE W REFLEX MICROSCOPIC
Glucose, UA: NEGATIVE mg/dL
Ketones, ur: 15 mg/dL — AB
NITRITE: POSITIVE — AB
PH: 6.5 (ref 5.0–8.0)
Protein, ur: >300 mg/dL — AB

## 2015-11-10 LAB — SALICYLATE LEVEL

## 2015-11-10 LAB — BASIC METABOLIC PANEL
ANION GAP: 16 — AB (ref 5–15)
BUN: 58 mg/dL — ABNORMAL HIGH (ref 6–20)
CALCIUM: 8.5 mg/dL — AB (ref 8.9–10.3)
CHLORIDE: 114 mmol/L — AB (ref 101–111)
CO2: 12 mmol/L — AB (ref 22–32)
Creatinine, Ser: 3.47 mg/dL — ABNORMAL HIGH (ref 0.61–1.24)
GFR calc non Af Amer: 18 mL/min — ABNORMAL LOW (ref >60)
GFR, EST AFRICAN AMERICAN: 21 mL/min — AB (ref >60)
Glucose, Bld: 100 mg/dL — ABNORMAL HIGH (ref 65–99)
Potassium: 4.7 mmol/L (ref 3.5–5.1)
SODIUM: 142 mmol/L (ref 135–145)

## 2015-11-10 LAB — URINE MICROSCOPIC-ADD ON

## 2015-11-10 LAB — CBG MONITORING, ED: GLUCOSE-CAPILLARY: 98 mg/dL (ref 65–99)

## 2015-11-10 LAB — ACETAMINOPHEN LEVEL

## 2015-11-10 LAB — PROCALCITONIN: Procalcitonin: 1.69 ng/mL

## 2015-11-10 LAB — CK: Total CK: >50000 U/L — ABNORMAL HIGH (ref 49–397)

## 2015-11-10 LAB — LACTIC ACID, PLASMA: LACTIC ACID, VENOUS: 2.2 mmol/L — AB (ref 0.5–2.0)

## 2015-11-10 LAB — TROPONIN I: Troponin I: 8.53 ng/mL (ref <0.031)

## 2015-11-10 MED ORDER — PIPERACILLIN-TAZOBACTAM 3.375 G IVPB 30 MIN
3.3750 g | Freq: Once | INTRAVENOUS | Status: AC
  Administered 2015-11-10: 3.375 g via INTRAVENOUS

## 2015-11-10 MED ORDER — SODIUM CHLORIDE 0.9 % IV BOLUS (SEPSIS)
1000.0000 mL | Freq: Once | INTRAVENOUS | Status: AC
  Administered 2015-11-11: 1000 mL via INTRAVENOUS

## 2015-11-10 MED ORDER — VANCOMYCIN HCL IN DEXTROSE 1-5 GM/200ML-% IV SOLN
1000.0000 mg | Freq: Once | INTRAVENOUS | Status: DC
  Filled 2015-11-10: qty 200

## 2015-11-10 MED ORDER — SODIUM CHLORIDE 0.9% FLUSH
3.0000 mL | Freq: Two times a day (BID) | INTRAVENOUS | Status: DC
  Administered 2015-11-11: 30 mL via INTRAVENOUS
  Administered 2015-11-11: 3 mL via INTRAVENOUS
  Administered 2015-11-12: 10 mL via INTRAVENOUS
  Administered 2015-11-12: 3 mL via INTRAVENOUS
  Administered 2015-11-13: 40 mL via INTRAVENOUS
  Administered 2015-11-13: 3 mL via INTRAVENOUS

## 2015-11-10 MED ORDER — ASPIRIN 300 MG RE SUPP
RECTAL | Status: AC
  Filled 2015-11-10: qty 1

## 2015-11-10 MED ORDER — LORAZEPAM 2 MG/ML IJ SOLN
INTRAMUSCULAR | Status: AC
  Administered 2015-11-10: 2 mg via INTRAVENOUS
  Filled 2015-11-10: qty 1

## 2015-11-10 MED ORDER — M.V.I. ADULT IV INJ
INJECTION | INTRAVENOUS | Status: AC
Start: 2015-11-10 — End: 2015-11-10
  Filled 2015-11-10: qty 10

## 2015-11-10 MED ORDER — LORAZEPAM 2 MG/ML IJ SOLN
1.0000 mg | Freq: Once | INTRAMUSCULAR | Status: AC
  Administered 2015-11-10: 1 mg via INTRAVENOUS
  Filled 2015-11-10: qty 1

## 2015-11-10 MED ORDER — ONDANSETRON HCL 4 MG/2ML IJ SOLN: 4.0000 mg | Freq: Four times a day (QID) | INTRAMUSCULAR | Status: DC | PRN

## 2015-11-10 MED ORDER — VANCOMYCIN HCL IN DEXTROSE 1-5 GM/200ML-% IV SOLN
1000.0000 mg | Freq: Once | INTRAVENOUS | Status: AC
  Administered 2015-11-10: 1000 mg via INTRAVENOUS

## 2015-11-10 MED ORDER — LORAZEPAM 2 MG/ML IJ SOLN
1.0000 mg | Freq: Once | INTRAMUSCULAR | Status: AC
  Administered 2015-11-10: 1 mg via INTRAVENOUS

## 2015-11-10 MED ORDER — SODIUM CHLORIDE 0.9 % IV BOLUS (SEPSIS)
1000.0000 mL | Freq: Once | INTRAVENOUS | Status: AC
  Administered 2015-11-10: 1000 mL via INTRAVENOUS

## 2015-11-10 MED ORDER — LORAZEPAM 2 MG/ML IJ SOLN
2.0000 mg | INTRAMUSCULAR | Status: DC | PRN
  Administered 2015-11-10: 2 mg via INTRAVENOUS

## 2015-11-10 MED ORDER — FOLIC ACID 5 MG/ML IJ SOLN
INTRAMUSCULAR | Status: AC
  Filled 2015-11-10: qty 0.2

## 2015-11-10 MED ORDER — PIPERACILLIN-TAZOBACTAM 3.375 G IVPB 30 MIN
3.3750 g | Freq: Once | INTRAVENOUS | Status: DC
  Filled 2015-11-10: qty 50

## 2015-11-10 MED ORDER — ACETAMINOPHEN 650 MG RE SUPP
650.0000 mg | Freq: Once | RECTAL | Status: AC
  Administered 2015-11-10: 650 mg via RECTAL

## 2015-11-10 MED ORDER — HEPARIN SODIUM (PORCINE) 5000 UNIT/ML IJ SOLN: 5000.0000 [IU] | Freq: Three times a day (TID) | INTRAMUSCULAR | Status: DC

## 2015-11-10 MED ORDER — TAMSULOSIN HCL 0.4 MG PO CAPS
0.4000 mg | ORAL_CAPSULE | Freq: Every day | ORAL | Status: DC
  Filled 2015-11-10: qty 1

## 2015-11-10 MED ORDER — THIAMINE HCL 100 MG/ML IJ SOLN
INTRAMUSCULAR | Status: AC
  Filled 2015-11-10: qty 2

## 2015-11-10 MED ORDER — PIPERACILLIN-TAZOBACTAM 3.375 G IVPB
3.3750 g | Freq: Three times a day (TID) | INTRAVENOUS | Status: DC
  Administered 2015-11-11: 3.375 g via INTRAVENOUS
  Filled 2015-11-10 (×2): qty 50

## 2015-11-10 MED ORDER — SODIUM CHLORIDE 0.9 % IV SOLN
INTRAVENOUS | Status: DC
Start: 2015-11-10 — End: 2015-11-10
  Administered 2015-11-10: 18:00:00 via INTRAVENOUS

## 2015-11-10 MED ORDER — ACETAMINOPHEN 650 MG RE SUPP
RECTAL | Status: AC
  Administered 2015-11-10: 650 mg via RECTAL
  Filled 2015-11-10: qty 1

## 2015-11-10 MED ORDER — THIAMINE HCL 100 MG/ML IJ SOLN
Freq: Once | INTRAVENOUS | Status: AC
  Administered 2015-11-11: via INTRAVENOUS
  Filled 2015-11-10: qty 1000

## 2015-11-10 MED ORDER — LORAZEPAM 2 MG/ML IJ SOLN
INTRAMUSCULAR | Status: AC
  Administered 2015-11-10: 1 mg via INTRAVENOUS
  Filled 2015-11-10: qty 1

## 2015-11-10 MED ORDER — ASPIRIN 300 MG RE SUPP
300.0000 mg | Freq: Once | RECTAL | Status: AC
Start: 2015-11-10 — End: 2015-11-10
  Administered 2015-11-10: 300 mg via RECTAL

## 2015-11-10 MED ORDER — ONDANSETRON HCL 4 MG PO TABS
4.0000 mg | ORAL_TABLET | Freq: Four times a day (QID) | ORAL | Status: DC | PRN
Start: 2015-11-10 — End: 2015-11-11

## 2015-11-10 MED ORDER — ABACAVIR-DOLUTEGRAVIR-LAMIVUD 600-50-300 MG PO TABS: 1.0000 | ORAL_TABLET | Freq: Every day | ORAL | Status: DC

## 2015-11-10 MED ORDER — VANCOMYCIN HCL IN DEXTROSE 750-5 MG/150ML-% IV SOLN: 750.0000 mg | INTRAVENOUS | Status: DC

## 2015-11-10 NOTE — ED Notes (Signed)
Pt returned from xray, agitation continues, iv at left shoulder infiltrated, iv removed, are swollen, bruised, parents at bedside,

## 2015-11-10 NOTE — ED Notes (Signed)
Pt taken to XR.

## 2015-11-10 NOTE — ED Notes (Addendum)
Pt transported to ct, pt continues to be agitated, Dr Myna Hidalgo at bedside,

## 2015-11-10 NOTE — ED Notes (Signed)
Family at bedside. 

## 2015-11-10 NOTE — ED Notes (Signed)
MD at bedside.
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
## 2015-11-10 NOTE — ED Notes (Signed)
Unable to get a BP reading due to pt thrashing around in bed.

## 2015-11-10 NOTE — ED Notes (Addendum)
Dr Myna Hidalgo at bedside,

## 2015-11-10 NOTE — ED Notes (Signed)
Pt returned from xray,
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
## 2015-11-10 NOTE — ED Notes (Addendum)
Per EMS: Pt was at a friends house since [place] and has been shooting up LSD and meth all day. Pt also had crushed up oxycodone and snorted it today.  Pts friend called pt's mother and told her that he was not breathing well.   Pt denies si/hi.  Pt combative on arrival.  Pt has moderate bruising to right hand.  Pt also took 300 mg viagra and 4, 40 mg tablets of gabapentin. Pt also stuck fingers up rectum PTA to pull out fecal matter. Pt reports he has a hard time having bowel movements due to oxycodone use.
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
## 2015-11-10 NOTE — ED Notes (Signed)
Pt has redness noted to back of right hand, unsure of any injury, mom states that pt's hand was not like that when he left her house on Monday,

## 2015-11-10 NOTE — ED Notes (Signed)
[  pt continues to be agitated, Dr Ashok Cordia at bedside,

## 2015-11-10 NOTE — Progress Notes (Addendum)
Pharmacy Note:  Initial antibiotics for Vancomycin and Zosyn ordered by EDP for Sepsis.  Estimated Creatinine Clearance: 22.1 mL/min (by C-G formula based on Cr of 3.47).   No Known Allergies  Filed Vitals:   10/29/2015 1932 10/29/2015 1956  BP:  126/88  Pulse:  119  Temp: 101.1 F (38.4 C)   Resp:  19    Anti-infectives    Start     Dose/Rate Route Frequency Ordered Stop   11/09/2015 2000  vancomycin (VANCOCIN) IVPB 750 mg/150 ml premix     750 mg 150 mL/hr over 60 Minutes Intravenous Every 24 hours 11/24/2015 2128     11/01/2015 1000  abacavir-dolutegravir-lamiVUDine (TRIUMEQ) 178-37-542 MG per tablet 1 tablet     1 tablet Oral Daily 11/04/2015 2112     11/16/2015 0600  piperacillin-tazobactam (ZOSYN) IVPB 3.375 g     3.375 g 12.5 mL/hr over 240 Minutes Intravenous Every 8 hours 11/01/2015 2128     11/19/2015 2130  vancomycin (VANCOCIN) IVPB 1000 mg/200 mL premix     1,000 mg 200 mL/hr over 60 Minutes Intravenous  Once 11/09/2015 2112     11/12/2015 2115  piperacillin-tazobactam (ZOSYN) IVPB 3.375 g     3.375 g 100 mL/hr over 30 Minutes Intravenous  Once 11/18/2015 2112     11/16/2015 2000  piperacillin-tazobactam (ZOSYN) IVPB 3.375 g  Status:  Discontinued    Comments:  Give as soon as cultures drawn   3.375 g 100 mL/hr over 30 Minutes Intravenous  Once 11/19/2015 1953 11/21/2015 2123   11/12/2015 2000  vancomycin (VANCOCIN) IVPB 1000 mg/200 mL premix  Status:  Discontinued    Comments:  Give as soon as cultures drawn   1,000 mg 200 mL/hr over 60 Minutes Intravenous  Once 10/27/2015 1953 10/29/2015 2123      Plan: Initial doses of Vancomycin and Zosyn  X 1 ordered. F/U admission orders for further dosing if therapy continued.  Pricilla Larsson, [facility] 10/31/2015 9:29 PM   Vancomycin and Zosyn to be continued on admission. Zosyn 3.375gm IV every 8 hours. Follow-up micro data, labs, vitals.  Vancomycin 746m IV every 24 hours. Monitor labs, micro and vitals.   HPricilla Larsson [facility] 10/31/2015 9:29  PM

## 2015-11-10 NOTE — ED Notes (Signed)
CRITICAL VALUE ALERT  Critical value received:  Lactic acid 2.2, trop 8.53  Date of notification: 11/08/2015  Time of notification:  22:09  Critical value read back: yes  Nurse who received alert:  Rip Harbour RN   MD notified (1st page):  Dr Myna Hidalgo  Time of first page:  22:08  MD notified (2nd page):  Time of second page:  Responding MD:  Dr Myna Hidalgo  Time MD responded: 22:09

## 2015-11-10 NOTE — ED Notes (Signed)
Pt wanting to stick fingers up rectum to pull out feces.  Pt reports that he isn't able to use the bathroom due to oxycodone use 4 times per day. Pt told that he is not to stick fingers up rectum.  Pt explained that if he continues to do this he will lose sphincter tone.

## 2015-11-10 NOTE — ED Notes (Signed)
Dr Ashok Cordia at bedside,

## 2015-11-10 NOTE — ED Notes (Signed)
Dr Myna Hidalgo  Notified of pt's continued agitation, additional orders given

## 2015-11-10 NOTE — ED Notes (Signed)
mulitples attempt made to start iv, Dr Ashok Cordia aware,

## 2015-11-10 NOTE — ED Notes (Signed)
Received report on pt who remains in xray

## 2015-11-10 NOTE — ED Notes (Signed)
Dr Rosana Hoes at bedside,

## 2015-11-11 ENCOUNTER — Inpatient Hospital Stay (HOSPITAL_COMMUNITY): Payer: Medicaid Other

## 2015-11-11 ENCOUNTER — Inpatient Hospital Stay (HOSPITAL_COMMUNITY): Payer: Medicaid Other | Admitting: Anesthesiology

## 2015-11-11 ENCOUNTER — Encounter (HOSPITAL_COMMUNITY): Payer: Self-pay | Admitting: Certified Registered Nurse Anesthetist

## 2015-11-11 ENCOUNTER — Encounter (HOSPITAL_COMMUNITY): Admission: EM | Disposition: E | Payer: Self-pay | Source: Home / Self Care | Attending: Internal Medicine

## 2015-11-11 DIAGNOSIS — N179 Acute kidney failure, unspecified: Secondary | ICD-10-CM

## 2015-11-11 DIAGNOSIS — R6521 Severe sepsis with septic shock: Secondary | ICD-10-CM

## 2015-11-11 DIAGNOSIS — B182 Chronic viral hepatitis C: Secondary | ICD-10-CM

## 2015-11-11 DIAGNOSIS — R0600 Dyspnea, unspecified: Secondary | ICD-10-CM

## 2015-11-11 DIAGNOSIS — N400 Enlarged prostate without lower urinary tract symptoms: Secondary | ICD-10-CM

## 2015-11-11 DIAGNOSIS — D219 Benign neoplasm of connective and other soft tissue, unspecified: Secondary | ICD-10-CM

## 2015-11-11 DIAGNOSIS — A419 Sepsis, unspecified organism: Secondary | ICD-10-CM | POA: Diagnosis present

## 2015-11-11 DIAGNOSIS — M6282 Rhabdomyolysis: Secondary | ICD-10-CM

## 2015-11-11 DIAGNOSIS — R509 Fever, unspecified: Secondary | ICD-10-CM

## 2015-11-11 DIAGNOSIS — K6389 Other specified diseases of intestine: Secondary | ICD-10-CM | POA: Diagnosis present

## 2015-11-11 HISTORY — PX: LAPAROTOMY: SHX154

## 2015-11-11 HISTORY — PX: APPLICATION OF WOUND VAC: SHX5189

## 2015-11-11 HISTORY — PX: BOWEL RESECTION: SHX1257

## 2015-11-11 LAB — POCT I-STAT 3, ART BLOOD GAS (G3+)
ACID-BASE DEFICIT: 6 mmol/L — AB (ref 0.0–2.0)
Acid-base deficit: 2 mmol/L (ref 0.0–2.0)
Bicarbonate: 20.3 mEq/L (ref 20.0–24.0)
Bicarbonate: 24.8 mEq/L — ABNORMAL HIGH (ref 20.0–24.0)
O2 Saturation: 99 %
O2 Saturation: 96 %
PCO2 ART: 49.7 mmHg — AB (ref 35.0–45.0)
PH ART: 7.305 — AB (ref 7.350–7.450)
PH ART: 7.31 — AB (ref 7.350–7.450)
PO2 ART: 94 mmHg (ref 80.0–100.0)
Patient temperature: 100
TCO2: 22 mmol/L (ref 0–100)
TCO2: 26 mmol/L (ref 0–100)
pCO2 arterial: 40.1 mmHg (ref 35.0–45.0)
pO2, Arterial: 144 mmHg — ABNORMAL HIGH (ref 80.0–100.0)

## 2015-11-11 LAB — CBC WITH DIFFERENTIAL/PLATELET
Basophils Absolute: 0 10*3/uL (ref 0.0–0.1)
Basophils Relative: 0 %
EOS PCT: 0 %
Eosinophils Absolute: 0 10*3/uL (ref 0.0–0.7)
HCT: 47.1 % (ref 39.0–52.0)
HEMOGLOBIN: 15.5 g/dL (ref 13.0–17.0)
LYMPHS ABS: 0.7 10*3/uL (ref 0.7–4.0)
LYMPHS PCT: 6 %
MCH: 33.4 pg (ref 26.0–34.0)
MCHC: 32.9 g/dL (ref 30.0–36.0)
MCV: 101.5 fL — AB (ref 78.0–100.0)
MONOS PCT: 5 %
Monocytes Absolute: 0.6 10*3/uL (ref 0.1–1.0)
NEUTROS ABS: 10.9 10*3/uL — AB (ref 1.7–7.7)
Neutrophils Relative %: 89 %
PLATELETS: 88 10*3/uL — AB (ref 150–400)
RBC: 4.64 MIL/uL (ref 4.22–5.81)
RDW: 17.1 % — ABNORMAL HIGH (ref 11.5–15.5)
WBC MORPHOLOGY: INCREASED
WBC: 12.2 10*3/uL — ABNORMAL HIGH (ref 4.0–10.5)

## 2015-11-11 LAB — BLOOD CULTURE ID PANEL (REFLEXED)
ACINETOBACTER BAUMANNII: NOT DETECTED
CANDIDA TROPICALIS: NOT DETECTED
CARBAPENEM RESISTANCE: NOT DETECTED
Candida albicans: NOT DETECTED
Candida glabrata: NOT DETECTED
Candida krusei: NOT DETECTED
Candida parapsilosis: NOT DETECTED
ENTEROCOCCUS SPECIES: NOT DETECTED
Enterobacter cloacae complex: NOT DETECTED
Enterobacteriaceae species: NOT DETECTED
Escherichia coli: NOT DETECTED
HAEMOPHILUS INFLUENZAE: NOT DETECTED
Klebsiella oxytoca: NOT DETECTED
Klebsiella pneumoniae: NOT DETECTED
LISTERIA MONOCYTOGENES: NOT DETECTED
METHICILLIN RESISTANCE: DETECTED — AB
NEISSERIA MENINGITIDIS: NOT DETECTED
PROTEUS SPECIES: NOT DETECTED
Pseudomonas aeruginosa: NOT DETECTED
SERRATIA MARCESCENS: NOT DETECTED
STAPHYLOCOCCUS SPECIES: NOT DETECTED
STREPTOCOCCUS PYOGENES: NOT DETECTED
STREPTOCOCCUS SPECIES: NOT DETECTED
Staphylococcus aureus (BCID): DETECTED — AB
Streptococcus agalactiae: NOT DETECTED
Streptococcus pneumoniae: NOT DETECTED
Vancomycin resistance: NOT DETECTED

## 2015-11-11 LAB — BLOOD GAS, ARTERIAL
ACID-BASE DEFICIT: 12.3 mmol/L — AB (ref 0.0–2.0)
Acid-Base Excess: 14.4 mmol/L — ABNORMAL HIGH (ref 0.0–2.0)
Bicarbonate: 12.6 mEq/L — ABNORMAL LOW (ref 20.0–24.0)
Bicarbonate: 13.9 mEq/L — ABNORMAL LOW (ref 20.0–24.0)
DRAWN BY: 22223
DRAWN BY: 345601
FIO2: 100
FIO2: 1
LHR: 14 {breaths}/min
MECHVT: 500 mL
MECHVT: 500 mL
O2 SAT: 99 %
O2 Saturation: 94 %
PEEP/CPAP: 7 cmH2O
PEEP/CPAP: 5 cmH2O
PH ART: 7.222 — AB (ref 7.350–7.450)
PO2 ART: 106 mmHg — AB (ref 80.0–100.0)
Patient temperature: 38.4
Patient temperature: 98.6
RATE: 14 resp/min
TCO2: 21.3 mmol/L (ref 0–100)
TCO2: 15 mmol/L (ref 0–100)
pCO2 arterial: 43.5 mmHg (ref 35.0–45.0)
pCO2 arterial: 35.1 mmHg (ref 35.0–45.0)
pH, Arterial: 7.11 — CL (ref 7.350–7.450)
pO2, Arterial: 245 mmHg — ABNORMAL HIGH (ref 80.0–100.0)

## 2015-11-11 LAB — COMPREHENSIVE METABOLIC PANEL
ALBUMIN: 2.7 g/dL — AB (ref 3.5–5.0)
ALK PHOS: 97 U/L (ref 38–126)
ALT: 372 U/L — ABNORMAL HIGH (ref 17–63)
ANION GAP: 16 — AB (ref 5–15)
AST: 1191 U/L — ABNORMAL HIGH (ref 15–41)
BUN: 70 mg/dL — ABNORMAL HIGH (ref 6–20)
CHLORIDE: 112 mmol/L — AB (ref 101–111)
CO2: 16 mmol/L — AB (ref 22–32)
Calcium: 6.9 mg/dL — ABNORMAL LOW (ref 8.9–10.3)
Creatinine, Ser: 3.86 mg/dL — ABNORMAL HIGH (ref 0.61–1.24)
GFR calc Af Amer: 18 mL/min — ABNORMAL LOW (ref >60)
GFR calc non Af Amer: 16 mL/min — ABNORMAL LOW (ref >60)
GLUCOSE: 94 mg/dL (ref 65–99)
POTASSIUM: 5.3 mmol/L — AB (ref 3.5–5.1)
SODIUM: 144 mmol/L (ref 135–145)
Total Bilirubin: 5.7 mg/dL — ABNORMAL HIGH (ref 0.3–1.2)
Total Protein: 7.2 g/dL (ref 6.5–8.1)

## 2015-11-11 LAB — TYPE AND SCREEN
ABO/RH(D): O POS
Antibody Screen: NEGATIVE

## 2015-11-11 LAB — CBC
HCT: 38.7 % — ABNORMAL LOW (ref 39.0–52.0)
Hemoglobin: 12.1 g/dL — ABNORMAL LOW (ref 13.0–17.0)
MCH: 31.6 pg (ref 26.0–34.0)
MCHC: 31.3 g/dL (ref 30.0–36.0)
MCV: 101 fL — ABNORMAL HIGH (ref 78.0–100.0)
PLATELETS: 38 10*3/uL — AB (ref 150–400)
RBC: 3.83 MIL/uL — AB (ref 4.22–5.81)
RDW: 16.9 % — ABNORMAL HIGH (ref 11.5–15.5)
WBC: 6.5 10*3/uL (ref 4.0–10.5)

## 2015-11-11 LAB — PROTIME-INR
INR: 2.22 — ABNORMAL HIGH (ref 0.00–1.49)
PROTHROMBIN TIME: 24.4 s — AB (ref 11.6–15.2)

## 2015-11-11 LAB — CBG MONITORING, ED: GLUCOSE-CAPILLARY: 96 mg/dL (ref 65–99)

## 2015-11-11 LAB — BASIC METABOLIC PANEL
Anion gap: 16 — ABNORMAL HIGH (ref 5–15)
BUN: 83 mg/dL — ABNORMAL HIGH (ref 6–20)
CALCIUM: 6.9 mg/dL — AB (ref 8.9–10.3)
CHLORIDE: 108 mmol/L (ref 101–111)
CO2: 20 mmol/L — ABNORMAL LOW (ref 22–32)
CREATININE: 4.17 mg/dL — AB (ref 0.61–1.24)
GFR, EST AFRICAN AMERICAN: 17 mL/min — AB (ref >60)
GFR, EST NON AFRICAN AMERICAN: 14 mL/min — AB (ref >60)
Glucose, Bld: 124 mg/dL — ABNORMAL HIGH (ref 65–99)
Potassium: 4.5 mmol/L (ref 3.5–5.1)
SODIUM: 144 mmol/L (ref 135–145)

## 2015-11-11 LAB — LACTIC ACID, PLASMA: LACTIC ACID, VENOUS: 2.6 mmol/L — AB (ref 0.5–2.0)

## 2015-11-11 LAB — ABO/RH: ABO/RH(D): O POS

## 2015-11-11 LAB — TROPONIN I
TROPONIN I: 27.18 ng/mL — AB (ref <0.031)
Troponin I: 25.27 ng/mL (ref <0.031)

## 2015-11-11 LAB — PHOSPHORUS: Phosphorus: 7.7 mg/dL — ABNORMAL HIGH (ref 2.5–4.6)

## 2015-11-11 LAB — CARBOXYHEMOGLOBIN
CARBOXYHEMOGLOBIN: 1.1 % (ref 0.5–1.5)
METHEMOGLOBIN: 0.9 % (ref 0.0–1.5)
O2 SAT: 80.3 %
TOTAL HEMOGLOBIN: 15.8 g/dL (ref 13.5–18.0)

## 2015-11-11 LAB — AMYLASE: Amylase: 122 U/L — ABNORMAL HIGH (ref 28–100)

## 2015-11-11 LAB — GLUCOSE, CAPILLARY
GLUCOSE-CAPILLARY: 90 mg/dL (ref 65–99)
Glucose-Capillary: 98 mg/dL (ref 65–99)

## 2015-11-11 LAB — LIPASE, BLOOD: Lipase: 21 U/L (ref 11–51)

## 2015-11-11 LAB — CORTISOL: CORTISOL PLASMA: 58.2 ug/dL

## 2015-11-11 LAB — MAGNESIUM: Magnesium: 2.3 mg/dL (ref 1.7–2.4)

## 2015-11-11 LAB — MRSA PCR SCREENING: MRSA by PCR: POSITIVE — AB

## 2015-11-11 SURGERY — LAPAROTOMY, EXPLORATORY
Anesthesia: General | Site: Abdomen

## 2015-11-11 SURGERY — Surgical Case
Anesthesia: *Unknown

## 2015-11-11 MED ORDER — ROCURONIUM BROMIDE 50 MG/5ML IV SOLN
INTRAVENOUS | Status: AC | PRN
  Administered 2015-11-11: 100 mg via INTRAVENOUS

## 2015-11-11 MED ORDER — ROCURONIUM BROMIDE 100 MG/10ML IV SOLN
INTRAVENOUS | Status: DC | PRN
  Administered 2015-11-11: 50 mg via INTRAVENOUS

## 2015-11-11 MED ORDER — HYDROCORTISONE NA SUCCINATE PF 100 MG IJ SOLR
50.0000 mg | Freq: Four times a day (QID) | INTRAMUSCULAR | Status: DC
  Administered 2015-11-11 – 2015-11-16 (×21): 50 mg via INTRAVENOUS
  Filled 2015-11-11 (×8): qty 1
  Filled 2015-11-11 (×2): qty 2
  Filled 2015-11-11 (×9): qty 1
  Filled 2015-11-11: qty 2
  Filled 2015-11-11 (×2): qty 1

## 2015-11-11 MED ORDER — PROPOFOL 1000 MG/100ML IV EMUL
INTRAVENOUS | Status: AC
  Administered 2015-11-11: 10 ug/kg/min via INTRAVENOUS
  Filled 2015-11-11: qty 100

## 2015-11-11 MED ORDER — PROPOFOL 1000 MG/100ML IV EMUL
0.0000 ug/kg/min | INTRAVENOUS | Status: DC
  Administered 2015-11-11: 10 ug/kg/min via INTRAVENOUS

## 2015-11-11 MED ORDER — FENTANYL CITRATE (PF) 100 MCG/2ML IJ SOLN: 100.0000 ug | INTRAMUSCULAR | Status: DC | PRN

## 2015-11-11 MED ORDER — HEPARIN (PORCINE) IN NACL 100-0.45 UNIT/ML-% IJ SOLN
1000.0000 [IU]/h | INTRAMUSCULAR | Status: DC
  Administered 2015-11-11: 850 [IU]/h via INTRAVENOUS
  Filled 2015-11-11 (×3): qty 250

## 2015-11-11 MED ORDER — PANTOPRAZOLE SODIUM 40 MG IV SOLR
40.0000 mg | Freq: Every day | INTRAVENOUS | Status: DC
  Administered 2015-11-11 – 2015-11-17 (×7): 40 mg via INTRAVENOUS
  Filled 2015-11-11 (×7): qty 40

## 2015-11-11 MED ORDER — MIDAZOLAM HCL 2 MG/2ML IJ SOLN
INTRAMUSCULAR | Status: AC
  Filled 2015-11-11: qty 2

## 2015-11-11 MED ORDER — SODIUM CHLORIDE 0.9 % IV BOLUS (SEPSIS): 500.0000 mL | INTRAVENOUS | Status: DC | PRN

## 2015-11-11 MED ORDER — HEPARIN BOLUS VIA INFUSION
4000.0000 [IU] | Freq: Once | INTRAVENOUS | Status: DC
  Filled 2015-11-11: qty 4000

## 2015-11-11 MED ORDER — SODIUM CHLORIDE 0.9 % IV SOLN
10.0000 ug/h | INTRAVENOUS | Status: DC
  Filled 2015-11-11: qty 50

## 2015-11-11 MED ORDER — PERFLUTREN LIPID MICROSPHERE
1.0000 mL | INTRAVENOUS | Status: AC | PRN
  Administered 2015-11-11: 3 mL via INTRAVENOUS
  Filled 2015-11-11: qty 10

## 2015-11-11 MED ORDER — ACETAMINOPHEN 650 MG RE SUPP
650.0000 mg | Freq: Once | RECTAL | Status: AC
  Administered 2015-11-11: 650 mg via RECTAL
  Filled 2015-11-11: qty 1

## 2015-11-11 MED ORDER — SODIUM CHLORIDE 0.9 % IV SOLN
INTRAVENOUS | Status: DC | PRN
  Administered 2015-11-11: 10:00:00 via INTRAVENOUS

## 2015-11-11 MED ORDER — SODIUM BICARBONATE 8.4 % IV SOLN
INTRAVENOUS | Status: AC
  Filled 2015-11-11: qty 150

## 2015-11-11 MED ORDER — HEPARIN (PORCINE) IN NACL 100-0.45 UNIT/ML-% IJ SOLN: 800.0000 [IU]/h | INTRAMUSCULAR | Status: DC

## 2015-11-11 MED ORDER — SODIUM CHLORIDE 0.9 % IV SOLN
Freq: Once | INTRAVENOUS | Status: AC
  Administered 2015-11-11: 10 mL/h via INTRAVENOUS

## 2015-11-11 MED ORDER — MIDAZOLAM HCL 2 MG/2ML IJ SOLN
2.0000 mg | INTRAMUSCULAR | Status: DC | PRN
  Administered 2015-11-12: 2 mg via INTRAVENOUS
  Administered 2015-11-12: 1 mg via INTRAVENOUS
  Administered 2015-11-13 – 2015-11-16 (×24): 2 mg via INTRAVENOUS
  Administered 2015-11-17: 1 mg via INTRAVENOUS
  Administered 2015-11-18 (×2): 2 mg via INTRAVENOUS
  Filled 2015-11-11 (×26): qty 2

## 2015-11-11 MED ORDER — ETOMIDATE 2 MG/ML IV SOLN
INTRAVENOUS | Status: AC | PRN
  Administered 2015-11-11: 20 mg via INTRAVENOUS

## 2015-11-11 MED ORDER — 0.9 % SODIUM CHLORIDE (POUR BTL) OPTIME
TOPICAL | Status: DC | PRN
  Administered 2015-11-11 (×4): 1000 mL

## 2015-11-11 MED ORDER — FENTANYL CITRATE (PF) 100 MCG/2ML IJ SOLN: 50.0000 ug | Freq: Once | INTRAMUSCULAR | Status: DC

## 2015-11-11 MED ORDER — DEXTROSE 5 % IV SOLN
5.0000 ug/min | INTRAVENOUS | Status: DC
  Administered 2015-11-11: 15 ug/min via INTRAVENOUS
  Administered 2015-11-12: 20 ug/min via INTRAVENOUS
  Administered 2015-11-12: 10 ug/min via INTRAVENOUS
  Administered 2015-11-14: 2 ug/min via INTRAVENOUS
  Administered 2015-11-14: 1.5 ug/min via INTRAVENOUS
  Administered 2015-11-15: 2 ug/min via INTRAVENOUS
  Filled 2015-11-11 (×5): qty 16

## 2015-11-11 MED ORDER — VANCOMYCIN HCL IN DEXTROSE 1-5 GM/200ML-% IV SOLN
1000.0000 mg | INTRAVENOUS | Status: DC
  Filled 2015-11-11: qty 200

## 2015-11-11 MED ORDER — PIPERACILLIN-TAZOBACTAM IN DEX 2-0.25 GM/50ML IV SOLN
2.2500 g | Freq: Three times a day (TID) | INTRAVENOUS | Status: DC
  Administered 2015-11-11 – 2015-11-12 (×3): 2.25 g via INTRAVENOUS
  Filled 2015-11-11 (×5): qty 50

## 2015-11-11 MED ORDER — CHLORHEXIDINE GLUCONATE CLOTH 2 % EX PADS
6.0000 | MEDICATED_PAD | Freq: Every day | CUTANEOUS | Status: AC
  Administered 2015-11-12 – 2015-11-15 (×4): 6 via TOPICAL

## 2015-11-11 MED ORDER — ANTISEPTIC ORAL RINSE SOLUTION (CORINZ)
7.0000 mL | Freq: Four times a day (QID) | OROMUCOSAL | Status: DC
  Administered 2015-11-11 – 2015-11-22 (×44): 7 mL via OROMUCOSAL

## 2015-11-11 MED ORDER — CHLORHEXIDINE GLUCONATE 0.12% ORAL RINSE (MEDLINE KIT)
15.0000 mL | Freq: Two times a day (BID) | OROMUCOSAL | Status: DC
  Administered 2015-11-11 – 2015-11-22 (×23): 15 mL via OROMUCOSAL

## 2015-11-11 MED ORDER — VITAMIN K1 10 MG/ML IJ SOLN
10.0000 mg | Freq: Once | INTRAMUSCULAR | Status: AC
  Administered 2015-11-11: 10 mg via INTRAVENOUS
  Filled 2015-11-11: qty 1

## 2015-11-11 MED ORDER — FLUCONAZOLE IN SODIUM CHLORIDE 200-0.9 MG/100ML-% IV SOLN
200.0000 mg | Freq: Every day | INTRAVENOUS | Status: DC
  Administered 2015-11-11 – 2015-11-14 (×4): 200 mg via INTRAVENOUS
  Filled 2015-11-11 (×6): qty 100

## 2015-11-11 MED ORDER — SODIUM CHLORIDE 0.9 % IV SOLN
250.0000 mL | INTRAVENOUS | Status: DC | PRN
  Administered 2015-11-11 – 2015-11-22 (×3): via INTRAVENOUS

## 2015-11-11 MED ORDER — PHENYLEPHRINE HCL 10 MG/ML IJ SOLN
10.0000 mg | INTRAVENOUS | Status: DC | PRN
  Administered 2015-11-11: 40 ug/min via INTRAVENOUS

## 2015-11-11 MED ORDER — SODIUM CHLORIDE 0.9 % IV SOLN
25.0000 ug/h | INTRAVENOUS | Status: DC
  Administered 2015-11-11: 250 ug/h via INTRAVENOUS
  Administered 2015-11-12: 200 ug/h via INTRAVENOUS
  Administered 2015-11-12: 300 ug/h via INTRAVENOUS
  Administered 2015-11-13: 200 ug/h via INTRAVENOUS
  Administered 2015-11-13 – 2015-11-14 (×3): 250 ug/h via INTRAVENOUS
  Administered 2015-11-15 – 2015-11-16 (×5): 300 ug/h via INTRAVENOUS
  Administered 2015-11-16: 400 ug/h via INTRAVENOUS
  Administered 2015-11-17: 200 ug/h via INTRAVENOUS
  Administered 2015-11-17: 250 ug/h via INTRAVENOUS
  Administered 2015-11-18: 300 ug/h via INTRAVENOUS
  Administered 2015-11-19: 400 ug/h via INTRAVENOUS
  Administered 2015-11-19 – 2015-11-20 (×2): 200 ug/h via INTRAVENOUS
  Administered 2015-11-20 – 2015-11-21 (×2): 150 ug/h via INTRAVENOUS
  Administered 2015-11-21: 200 ug/h via INTRAVENOUS
  Administered 2015-11-22: 400 ug/h via INTRAVENOUS
  Filled 2015-11-11 (×27): qty 50

## 2015-11-11 MED ORDER — MUPIROCIN 2 % EX OINT
1.0000 "application " | TOPICAL_OINTMENT | Freq: Two times a day (BID) | CUTANEOUS | Status: AC
  Administered 2015-11-11 – 2015-11-15 (×10): 1 via NASAL
  Filled 2015-11-11 (×2): qty 22

## 2015-11-11 MED ORDER — FENTANYL BOLUS VIA INFUSION
50.0000 ug | INTRAVENOUS | Status: DC | PRN
  Administered 2015-11-11 – 2015-11-19 (×14): 50 ug via INTRAVENOUS
  Filled 2015-11-11: qty 50

## 2015-11-11 MED ORDER — STERILE WATER FOR INJECTION IV SOLN
INTRAVENOUS | Status: DC
  Administered 2015-11-11 – 2015-11-12 (×2): via INTRAVENOUS
  Filled 2015-11-11 (×10): qty 850

## 2015-11-11 MED ORDER — MIDAZOLAM HCL 2 MG/2ML IJ SOLN
2.0000 mg | INTRAMUSCULAR | Status: DC | PRN
  Administered 2015-11-11: 2 mg via INTRAVENOUS

## 2015-11-11 MED ORDER — FENTANYL CITRATE (PF) 2500 MCG/50ML IJ SOLN
INTRAMUSCULAR | Status: AC
  Filled 2015-11-11: qty 50

## 2015-11-11 MED ORDER — CALCIUM CHLORIDE 10 % IV SOLN
INTRAVENOUS | Status: DC | PRN
  Administered 2015-11-11 (×2): 500 mg via INTRAVENOUS

## 2015-11-11 MED ORDER — FENTANYL CITRATE (PF) 100 MCG/2ML IJ SOLN
100.0000 ug | Freq: Once | INTRAMUSCULAR | Status: AC
  Administered 2015-11-11: 100 ug via INTRAVENOUS

## 2015-11-11 MED ORDER — HEPARIN SODIUM (PORCINE) 5000 UNIT/ML IJ SOLN: 5000.0000 [IU] | Freq: Three times a day (TID) | INTRAMUSCULAR | Status: DC

## 2015-11-11 MED FILL — Norepinephrine Bitartrate IV Soln 1 MG/ML (Base Equivalent): INTRAVENOUS | Qty: 4 | Status: AC

## 2015-11-11 SURGICAL SUPPLY — 52 items
APL SKNCLS STERI-STRIP NONHPOA (GAUZE/BANDAGES/DRESSINGS) ×2
BENZOIN TINCTURE PRP APPL 2/3 (GAUZE/BANDAGES/DRESSINGS) ×4 IMPLANT
BLADE SURG ROTATE 9660 (MISCELLANEOUS) IMPLANT
CANISTER SUCTION 2500CC (MISCELLANEOUS) ×3 IMPLANT
CANISTER WOUND CARE 500ML ATS (WOUND CARE) ×2 IMPLANT
CHLORAPREP W/TINT 26ML (MISCELLANEOUS) ×3 IMPLANT
COVER SURGICAL LIGHT HANDLE (MISCELLANEOUS) ×3 IMPLANT
DRAPE LAPAROSCOPIC ABDOMINAL (DRAPES) ×3 IMPLANT
DRAPE WARM FLUID 44X44 (DRAPE) ×3 IMPLANT
DRSG OPSITE POSTOP 4X10 (GAUZE/BANDAGES/DRESSINGS) IMPLANT
DRSG OPSITE POSTOP 4X8 (GAUZE/BANDAGES/DRESSINGS) IMPLANT
ELECT BLADE 6.5 EXT (BLADE) IMPLANT
ELECT CAUTERY BLADE 6.4 (BLADE) ×3 IMPLANT
ELECT REM PT RETURN 9FT ADLT (ELECTROSURGICAL) ×3
ELECTRODE REM PT RTRN 9FT ADLT (ELECTROSURGICAL) ×1 IMPLANT
GLOVE BIO SURGEON STRL SZ7.5 (GLOVE) ×2 IMPLANT
GLOVE BIO SURGEON STRL SZ8 (GLOVE) ×5 IMPLANT
GLOVE BIOGEL PI IND STRL 6.5 (GLOVE) IMPLANT
GLOVE BIOGEL PI IND STRL 7.0 (GLOVE) IMPLANT
GLOVE BIOGEL PI IND STRL 7.5 (GLOVE) IMPLANT
GLOVE BIOGEL PI IND STRL 8 (GLOVE) ×1 IMPLANT
GLOVE BIOGEL PI INDICATOR 6.5 (GLOVE) ×2
GLOVE BIOGEL PI INDICATOR 7.0 (GLOVE) ×2
GLOVE BIOGEL PI INDICATOR 7.5 (GLOVE) ×2
GLOVE BIOGEL PI INDICATOR 8 (GLOVE) ×2
GLOVE ECLIPSE 6.0 STRL STRAW (GLOVE) ×2 IMPLANT
GOWN STRL REUS W/ TWL LRG LVL3 (GOWN DISPOSABLE) ×1 IMPLANT
GOWN STRL REUS W/ TWL XL LVL3 (GOWN DISPOSABLE) ×1 IMPLANT
GOWN STRL REUS W/TWL LRG LVL3 (GOWN DISPOSABLE) ×3
GOWN STRL REUS W/TWL XL LVL3 (GOWN DISPOSABLE) ×3
KIT BASIN OR (CUSTOM PROCEDURE TRAY) ×3 IMPLANT
KIT ROOM TURNOVER OR (KITS) ×3 IMPLANT
LIGASURE IMPACT 36 18CM CVD LR (INSTRUMENTS) ×4 IMPLANT
NS IRRIG 1000ML POUR BTL (IV SOLUTION) ×6 IMPLANT
PACK GENERAL/GYN (CUSTOM PROCEDURE TRAY) ×3 IMPLANT
PAD ARMBOARD 7.5X6 YLW CONV (MISCELLANEOUS) ×3 IMPLANT
RELOAD PROXIMATE 75MM BLUE (ENDOMECHANICALS) ×3 IMPLANT
RELOAD STAPLE 75 3.8 BLU REG (ENDOMECHANICALS) IMPLANT
SPECIMEN JAR LARGE (MISCELLANEOUS) IMPLANT
SPONGE ABDOMINAL VAC ABTHERA (MISCELLANEOUS) ×2 IMPLANT
SPONGE LAP 18X18 X RAY DECT (DISPOSABLE) IMPLANT
STAPLER PROXIMATE 75MM BLUE (STAPLE) ×2 IMPLANT
STAPLER VISISTAT 35W (STAPLE) ×3 IMPLANT
SUCTION POOLE TIP (SUCTIONS) ×3 IMPLANT
SUT PDS AB 1 TP1 96 (SUTURE) ×6 IMPLANT
SUT SILK 2 0 SH CR/8 (SUTURE) ×3 IMPLANT
SUT SILK 2 0 TIES 10X30 (SUTURE) ×3 IMPLANT
SUT SILK 3 0 SH CR/8 (SUTURE) ×3 IMPLANT
SUT SILK 3 0 TIES 10X30 (SUTURE) ×3 IMPLANT
TOWEL OR 17X26 10 PK STRL BLUE (TOWEL DISPOSABLE) ×3 IMPLANT
TRAY FOLEY CATH 16FRSI W/METER (SET/KITS/TRAYS/PACK) IMPLANT
YANKAUER SUCT BULB TIP NO VENT (SUCTIONS) IMPLANT

## 2015-11-11 NOTE — Progress Notes (Signed)
CRITICAL VALUE ALERT  Critical value received:  Gram Positive Cocci in Clusters in all 4 bottles [employer])  Date of notification:  10/28/2015  Time of notification:  1300  Critical value read back:Yes.    Nurse who received alert:  Rosine Door   MD notified (1st page):  Dr. Halford Chessman  Time of first page:  1303  MD notified (2nd page):  Time of second page:  Responding MD:  Dr. Halford Chessman  Time MD responded:  [phone]

## 2015-11-11 NOTE — ED Notes (Signed)
Pt has multiple areas of redness and bruising noted to entire body,

## 2015-11-11 NOTE — ED Notes (Signed)
[facility] remains at bedside, loading pt to stretcher,

## 2015-11-11 NOTE — Progress Notes (Signed)
PHARMACY - PHYSICIAN COMMUNICATION CRITICAL VALUE ALERT - BLOOD CULTURE IDENTIFICATION (BCID)  Results for orders placed or performed during the hospital encounter of 11/21/2015  Blood Culture ID Panel (Reflexed) (Collected: 11/03/2015  9:15 PM)  Result Value Ref Range   Enterococcus species NOT DETECTED NOT DETECTED   Vancomycin resistance NOT DETECTED NOT DETECTED   Listeria monocytogenes NOT DETECTED NOT DETECTED   Staphylococcus species NOT DETECTED NOT DETECTED   Staphylococcus aureus DETECTED (A) NOT DETECTED   Methicillin resistance DETECTED (A) NOT DETECTED   Streptococcus species NOT DETECTED NOT DETECTED   Streptococcus agalactiae NOT DETECTED NOT DETECTED   Streptococcus pneumoniae NOT DETECTED NOT DETECTED   Streptococcus pyogenes NOT DETECTED NOT DETECTED   Acinetobacter baumannii NOT DETECTED NOT DETECTED   Enterobacteriaceae species NOT DETECTED NOT DETECTED   Enterobacter cloacae complex NOT DETECTED NOT DETECTED   Escherichia coli NOT DETECTED NOT DETECTED   Klebsiella oxytoca NOT DETECTED NOT DETECTED   Klebsiella pneumoniae NOT DETECTED NOT DETECTED   Proteus species NOT DETECTED NOT DETECTED   Serratia marcescens NOT DETECTED NOT DETECTED   Carbapenem resistance NOT DETECTED NOT DETECTED   Haemophilus influenzae NOT DETECTED NOT DETECTED   Neisseria meningitidis NOT DETECTED NOT DETECTED   Pseudomonas aeruginosa NOT DETECTED NOT DETECTED   Candida albicans NOT DETECTED NOT DETECTED   Candida glabrata NOT DETECTED NOT DETECTED   Candida krusei NOT DETECTED NOT DETECTED   Candida parapsilosis NOT DETECTED NOT DETECTED   Candida tropicalis NOT DETECTED NOT DETECTED    Name of physician (or Provider) Contacted: None - pt already on appropriate therapy  Changes to prescribed antibiotics required: None - initiated on vancomycin yesterday  Sharol Croghan, Rande Lawman 11/07/2015  6:04 PM

## 2015-11-11 NOTE — ED Notes (Signed)
[facility] here to transport pt.
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
## 2015-11-11 NOTE — Progress Notes (Addendum)
Addendum (~0630): Heparin never actually started as now pt needs emergent exp lap and probable bowel resection. Will d/c heparin levels and orders for now and f/u if plan to restart after surgery.  ANTICOAGULATION CONSULT NOTE - Initial Consult  Pharmacy Consult for Heparin Indication: chest pain/ACS  No Known Allergies  Patient Measurements: Weight: 143 lb 11.8 oz (65.2 kg) Heparin Dosing Weight: 65 kg  Vital Signs: Temp: 101.1 F (38.4 C) (05/18 0245) Temp Source: Core (Comment) (05/17 2323) BP: 90/73 mmHg (05/18 0245) Pulse Rate: 68 (05/18 0407)  Labs:  Recent Labs  11/01/2015 1709 11/04/2015 2045  HGB 18.3*  --   HCT 54.1*  --   PLT 122*  --   CREATININE 3.47*  --   CKTOTAL >50000*  --   TROPONINI  --  8.53*    Estimated Creatinine Clearance: 21.4 mL/min (by C-G formula based on Cr of 3.47).   Medical History: Past Medical History  Diagnosis Date  . Anemia   . Arthritis   . Oxygen deficiency   . Hypertension   . Cirrhosis of liver ([place])   . Urethral stricture   . BPH (benign prostatic hyperplasia)   . Trigger finger   . Nerve pain   . Pancytopenia ([place])   . Pulmonary hypertension ([place])   . HIV (human immunodeficiency virus infection) ([place])   . Hep C w/o coma, chronic (HCC)     Medications:  Prescriptions prior to admission  Medication Sig Dispense Refill Last Dose  . abacavir-dolutegravir-lamiVUDine (TRIUMEQ) 600-50-300 MG tablet Take 1 tablet by mouth daily. 30 tablet 2 Past Week at Unknown time  . buPROPion (WELLBUTRIN XL) 150 MG 24 hr tablet Take 1 tablet (150 mg total) by mouth daily. Take 1 pill a day for 3 days, then twice a day 60 tablet 2 Past Week at Unknown time  . gabapentin (NEURONTIN) 300 MG capsule TAKE 2 CAPSULES (600 MG TOTAL) BY MOUTH 3 (THREE) TIMES DAILY. 180 capsule 2 Past Week at Unknown time  . Oxycodone HCl 10 MG TABS Take 1 tablet (10 mg total) by mouth 3 (three) times daily as needed. (Patient taking differently: Take 10 mg by  mouth 3 (three) times daily as needed (pain). ) 90 tablet 0 Past Week at Unknown time  . sildenafil (REVATIO) 20 MG tablet Take 2 tablets (40 mg total) by mouth 3 (three) times daily. 180 tablet 2 Past Week at Unknown time  . tamsulosin (FLOMAX) 0.4 MG CAPS capsule Take 1 capsule (0.4 mg total) by mouth daily. 30 capsule 3 Past Week at Unknown time    Assessment: 60 y.o. M presented to [facility] with AMS. Transferred to [facility] ICU. To begin heparin for NSTEMI. Trop up to 8.53. Hgb elevated and plt slightly low. Noted pt with h/o pancytopenia (platelets 80-90s over past 4 mos). Pt with liver dysfunction and acute renal failure (SCr up to 3.47 - was ~1 mos ago).   Goal of Therapy:  Heparin level 0.3-0.7 units/ml Monitor platelets by anticoagulation protocol: Yes   Plan:  Heparin IV bolus 4000 units Heparin gtt at 800 units/hr Will f/u heparin level in 8 hours Daily heparin level and CBC  Sherlon Handing, PharmD, BCPS Clinical pharmacist, pager [phone] 10/27/2015,5:20 AM

## 2015-11-11 NOTE — Anesthesia Preprocedure Evaluation (Addendum)
Anesthesia Evaluation  Patient identified by MRN, date of birth, ID band Patient unresponsive    Reviewed: Allergy & Precautions, NPO status , Patient's Chart, lab work & pertinent test results, Unable to perform ROS - Chart review only  Airway Mallampati: Intubated       Dental   Pulmonary Current Smoker,     + decreased breath sounds      Cardiovascular hypertension,  Rhythm:Regular Rate:Tachycardia     Neuro/Psych    GI/Hepatic   Endo/Other    Renal/GU      Musculoskeletal   Abdominal   Peds  Hematology   Anesthesia Other Findings   Reproductive/Obstetrics                            Anesthesia Physical Anesthesia Plan  ASA: IV  Anesthesia Plan: General   Post-op Pain Management:    Induction: Intravenous  Airway Management Planned: Oral ETT  Additional Equipment: Arterial line and CVP  Intra-op Plan:   Post-operative Plan: Post-operative intubation/ventilation  Informed Consent: I have reviewed the patients History and Physical, chart, labs and discussed the procedure including the risks, benefits and alternatives for the proposed anesthesia with the patient or authorized representative who has indicated his/her understanding and acceptance.     Plan Discussed with: CRNA and Anesthesiologist  Anesthesia Plan Comments:        Anesthesia Quick Evaluation

## 2015-11-11 NOTE — Progress Notes (Signed)
ANTICOAGULATION CONSULT NOTE - Initial Consult  Pharmacy Consult for Heparin Indication: chest pain/ACS  No Known Allergies  Patient Measurements: Height: _0  (175.3 cm) Weight: 143 lb 11.8 oz (65.2 kg) IBW/kg (Calculated) : 70.7 Heparin Dosing Weight: 65 kg  Vital Signs: Temp: 96.4 F (35.8 C) (05/18 1300) BP: 99/71 mmHg (05/18 1300) Pulse Rate: 111 (05/18 1300)  Labs:  Recent Labs  10/31/2015 1709 11/01/2015 2045 11/12/2015 0515 11/07/2015 0915  HGB 18.3*  --  15.5  --   HCT 54.1*  --  47.1  --   PLT 122*  --  88*  --   LABPROT  --   --  24.4*  --   INR  --   --  2.22*  --   CREATININE 3.47*  --  3.86*  --   CKTOTAL >50000*  --   --   --   TROPONINI  --  8.53*  --  25.27*    Estimated Creatinine Clearance: 19.2 mL/min (by C-G formula based on Cr of 3.86).   Medical History: Past Medical History  Diagnosis Date  . Anemia   . Arthritis   . Oxygen deficiency   . Hypertension   . Cirrhosis of liver ([place])   . Urethral stricture   . BPH (benign prostatic hyperplasia)   . Trigger finger   . Nerve pain   . Pancytopenia ([place])   . Pulmonary hypertension ([place])   . HIV (human immunodeficiency virus infection) ([place])   . Hep C w/o coma, chronic (HCC)     Medications:  Prescriptions prior to admission  Medication Sig Dispense Refill Last Dose  . abacavir-dolutegravir-lamiVUDine (TRIUMEQ) 600-50-300 MG tablet Take 1 tablet by mouth daily. 30 tablet 2 Past Week at Unknown time  . buPROPion (WELLBUTRIN XL) 150 MG 24 hr tablet Take 1 tablet (150 mg total) by mouth daily. Take 1 pill a day for 3 days, then twice a day 60 tablet 2 Past Week at Unknown time  . gabapentin (NEURONTIN) 300 MG capsule TAKE 2 CAPSULES (600 MG TOTAL) BY MOUTH 3 (THREE) TIMES DAILY. 180 capsule 2 Past Week at Unknown time  . Oxycodone HCl 10 MG TABS Take 1 tablet (10 mg total) by mouth 3 (three) times daily as needed. (Patient taking differently: Take 10 mg by mouth 3 (three) times daily as needed  (pain). ) 90 tablet 0 Past Week at Unknown time  . sildenafil (REVATIO) 20 MG tablet Take 2 tablets (40 mg total) by mouth 3 (three) times daily. 180 tablet 2 Past Week at Unknown time  . tamsulosin (FLOMAX) 0.4 MG CAPS capsule Take 1 capsule (0.4 mg total) by mouth daily. 30 capsule 3 Past Week at Unknown time    Assessment: 60 yo M presented to [facility] with AMS and NSTEMI. Troponin peaked at 25, CK > 50K. Pt was taken to the OR this morning for emergent ex lap and bowel resection. Heparin will begin for NSTEMI at 2200. Hgb stable at 15.5, plts down to 88.    Goal of Therapy:  Heparin level 0.3-0.7 units/ml Monitor platelets by anticoagulation protocol: Yes    Plan:  Hold heparin bolus given surgery this AM Begin heparin gtt at 850 units/hr at 2200 First level with AM labs Daily heparin level and CBC    Hughes Better, PharmD, BCPS Clinical Pharmacist 10/26/2015 2:09 PM

## 2015-11-11 NOTE — Progress Notes (Signed)
Pharmacy Antibiotic Note  Casey Pennington is a 60 y.o. male admitted on to [facility] on 10/26/2015 with sepsis.  Pharmacy was consulted for Vancomycin and Zosyn dosing. Now to add diflucan in immuncompromised patient.   Plan: Change Vancomycin to 1gm IV q48h Change Zosyn to 2.25gm IV q8h Diflucan 252m IV q24h Will f/u micro data, renal function, and pt's clinical condition Vanc trough prn   Height: _0  (175.3 cm) Weight: 143 lb 11.8 oz (65.2 kg) IBW/kg (Calculated) : 70.7  Temp (24hrs), Avg:100.3 F (37.9 C), Min:98 F (36.7 C), Max:101.3 F (38.5 C)   Recent Labs Lab 11/02/2015 1709 11/23/2015 2115 11/20/2015 0515 11/17/2015 0535  WBC 19.9*  --  12.2*  --   CREATININE 3.47*  --  3.86*  --   LATICACIDVEN  --  2.2*  --  2.6*    Estimated Creatinine Clearance: 19.2 mL/min (by C-G formula based on Cr of 3.86).    No Known Allergies  Antimicrobials this admission: 5/17 Vanc>> 5/17 Zosyn>> 5/18 Diflucan>>  Dose adjustments this admission: n/a  Microbiology results: 5/17 BCx x 2:  5/17 UCx:  5/18 MRSA PCR:   Thank you for allowing pharmacy to be a part of this patient's care.  CSherlon Handing PharmD, BCPS Clinical pharmacist, pager [phone]/18/2017 6:18 AM

## 2015-11-11 NOTE — H&P (Signed)
PULMONARY / CRITICAL CARE MEDICINE   Name: Casey Pennington MRN: [id number] DOB: [birth year]    ADMISSION DATE:  11/24/2015 CONSULTATION DATE:  5/18  REFERRING MD:  [facility]  CHIEF COMPLAINT:  AMS  HISTORY OF PRESENT ILLNESS:  60 year old male with past medical history as below, which includes hypertension, hepatitis C cirrhosis, pulmonary hypertension, HIV, and polysubstance abuse including injected medications. He was last seen normal by his mother about 2 days ago at which point he went to a friend's house and had not been seen since. 5/17 the friend contacted the mother describing the patient to be acting strange/having difficulty breathing and she contacted EMS. The patient had apparently been on a 2-3 day binge of injecting and snorting various amphetamines, opiates, gabapentin, and sildenafil. In the emergency department the patient was found to be tachycardic despite 3 L of IV fluid resuscitation. He was also febrile and agitated and unable to provide any full history. Due to SOB and abdominal pain with constipation he had CT of chest and abdomen, which demonstrated pneumatosis of distal small bowel loops.   PAST MEDICAL HISTORY :  He  has a past medical history of Anemia; Arthritis; Oxygen deficiency; Hypertension; Cirrhosis of liver ([place]); Urethral stricture; BPH (benign prostatic hyperplasia); Trigger finger; Nerve pain; Pancytopenia ([place]); Pulmonary hypertension (HCC); HIV (human immunodeficiency virus infection) ([place]); and Hep C w/o coma, chronic ([place]).  PAST SURGICAL HISTORY: He  has past surgical history that includes Fracture surgery.  No Known Allergies  No current facility-administered medications on file prior to encounter.   Current Outpatient Prescriptions on File Prior to Encounter  Medication Sig  . abacavir-dolutegravir-lamiVUDine (TRIUMEQ) 600-50-300 MG tablet Take 1 tablet by mouth daily.  Marland Kitchen buPROPion (WELLBUTRIN XL) 150 MG 24 hr tablet Take 1 tablet (150 mg  total) by mouth daily. Take 1 pill a day for 3 days, then twice a day  . gabapentin (NEURONTIN) 300 MG capsule TAKE 2 CAPSULES (600 MG TOTAL) BY MOUTH 3 (THREE) TIMES DAILY.  Marland Kitchen Oxycodone HCl 10 MG TABS Take 1 tablet (10 mg total) by mouth 3 (three) times daily as needed. (Patient taking differently: Take 10 mg by mouth 3 (three) times daily as needed (pain). )  . sildenafil (REVATIO) 20 MG tablet Take 2 tablets (40 mg total) by mouth 3 (three) times daily.  . tamsulosin (FLOMAX) 0.4 MG CAPS capsule Take 1 capsule (0.4 mg total) by mouth daily.    FAMILY HISTORY:  His indicated that his mother is alive. He indicated that his father is alive.   SOCIAL HISTORY: He  reports that he has been smoking Cigarettes.  He has been smoking about 0.25 packs per day. He does not have any smokeless tobacco history on file. 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 He reports that he does not drink alcohol.  REVIEW OF SYSTEMS:   unable  SUBJECTIVE:    VITAL SIGNS: BP 127/81 mmHg  Pulse 126  Temp(Src) 100.4 F (38 C) (Core (Comment))  Resp 40  Wt 68.04 kg (150 lb)  SpO2 100%  HEMODYNAMICS:    VENTILATOR SETTINGS:    INTAKE / OUTPUT:    PHYSICAL EXAMINATION: General:  Malnourished male in NAD on vent Neuro:  Comatose with sedation turned off HEENT:  [place]/AT, PERRL, no JVD Cardiovascular:  Tachy, regular rhythm. No obvious murmur Lungs:  Clear bilateral breath sounds Abdomen:  Soft, non-distended Musculoskeletal:  No acute deformity Skin:  Track marks BUE  LABS:  BMET  Recent Labs Lab 11/01/2015 1709  NA 142  K 4.7  CL 114*  CO2 12*  BUN 58*  CREATININE 3.47*  GLUCOSE 100*    Electrolytes  Recent Labs Lab 11/16/2015 1709  CALCIUM 8.5*    CBC  Recent Labs Lab 10/30/2015 1709  WBC 19.9*  HGB 18.3*  HCT 54.1*  PLT 122*    Coag's No results for input(s): APTT, INR in the last 168 hours.  Sepsis Markers  Recent Labs Lab 11/12/2015 2045  11/23/2015 2115  LATICACIDVEN  --  2.2*  PROCALCITON 1.69  --     ABG No results for input(s): PHART, PCO2ART, PO2ART in the last 168 hours.  Liver Enzymes  Recent Labs Lab 11/07/2015 2045  AST 1554*  ALT 359*  ALKPHOS 116  BILITOT 1.7*  ALBUMIN 4.1    Cardiac Enzymes  Recent Labs Lab 10/25/2015 2045  TROPONINI 8.53*    Glucose  Recent Labs Lab 10/27/2015 1655  GLUCAP 98    Imaging Ct Abdomen Pelvis Wo Contrast  11/09/2015  CLINICAL DATA:  60 year old male with 11 mm dated 07/02/2010. EXAM: CT ABDOMEN AND PELVIS WITHOUT CONTRAST TECHNIQUE: Multidetector CT imaging of the abdomen and pelvis was performed following the standard protocol without IV contrast. COMPARISON:  None. FINDINGS: Evaluation of this exam is limited in the absence of intravenous contrast. Evaluation is also limited due to respiratory motion artifact. The visualized lung bases are clear. There is no intra-abdominal free air. No free fluid identified. Large amount of linear and branching air noted in the left lobe of the liver compatible with portal venous gas. The liver is otherwise unremarkable. The gallbladder is mildly distended. No calcified stone or pericholecystic fluid identified. The pancreas, spleen, adrenal glands, kidneys, visualized ureters appear unremarkable. The urinary bladder is collapsed. The prostate and seminal vesicles are grossly unremarkable. Multiple top-normal caliber loops of small bowel noted throughout the abdomen with air-fluid levels, likely a degree of ileus. There is air within the wall of the distal small bowel loop within the pelvis compatible with pneumatosis. The appendix is unremarkable. There is moderate aortoiliac atherosclerotic disease. There is gas within the central SMV extending into the main portal vein. There is no adenopathy. The abdominal wall soft tissues appear unremarkable. There is osteopenia with degenerative changes of the spine. Right femoral fixation hardware as  well as evidence of prior left femoral intra medullary rod and screw noted. No acute fracture. IMPRESSION: Pneumatosis of distal small bowel loops with portal venous gas is highly concerning for ischemic bowel. Clinical correlation and surgical consult is advised. No pneumoperitoneum identified at this time. These results were called by telephone at the time of interpretation on 11/23/2015 at 9:48 pm to Dr. Lajean Saver , who verbally acknowledged these results. Electronically Signed   By: Anner Crete M.D.   On: 11/18/2015 21:52   Ct Head Wo Contrast  11/14/2015  CLINICAL DATA:  Altered mental status; drug overdose EXAM: CT HEAD WITHOUT CONTRAST TECHNIQUE: Contiguous axial images were obtained from the base of the skull through the vertex without intravenous contrast. COMPARISON:  None. FINDINGS: The ventricles are normal in size and configuration. There is no intracranial mass, hemorrhage, extra-axial fluid collection, or midline shift. Gray-white compartments appear within normal limits. No acute infarct evident bony calvarium appears intact. The visualized mastoid air cells are clear. There is debris in the right external auditory canal. Visualized orbits appear symmetric bilaterally. IMPRESSION: Probable cerumen in the right external auditory canal. No intracranial mass, hemorrhage, or  focal gray -white compartment lesions/acute appearing infarct. Electronically Signed   By: Lowella Grip III M.D.   On: 10/25/2015 19:04   Dg Chest Port 1 View  11/06/2015  CLINICAL DATA:  60 year old male with altered mental status EXAM: PORTABLE CHEST 1 VIEW COMPARISON:  None. FINDINGS: Single portable of the chest does not demonstrate focal consolidation. There is no pleural effusion. A linear lucency along the periphery of the right upper lobe at the mid clavicular region may be skin fold artifact. A small pneumothorax is not excluded. Repeat radiograph may provide better evaluation. Top-normal cardiac size.  There is mild prominence of the hilar vasculature without evidence of pulmonary edema or congestion. Old healed left clavicular fracture. No acute osseous pathology identified. IMPRESSION: Skin fold artifact versus a small right upper lobe pneumothorax. Repeat radiograph recommended for further evaluation. Electronically Signed   By: Anner Crete M.D.   On: 10/30/2015 18:17   Dg Abd Acute W/chest  10/29/2015  CLINICAL DATA:  Shortness of Breath. Abdominal pain with constipation EXAM: DG ABDOMEN ACUTE W/ 1V CHEST COMPARISON:  Chest radiograph obtained earlier in the day FINDINGS: PA chest: There is no edema or consolidation. There is borderline cardiomegaly. There is pulmonary arterial hypertension with prominence of the central pulmonary arteries and rapid peripheral tapering bilaterally. No adenopathy evident. Supine and upright abdomen: There is moderate colonic dilatation. There is no appreciable small bowel dilatation. No air-fluid levels. No free air evident. Liver and spleen appear enlarged. There are calcifications in the mid abdomen. IMPRESSION: Suspect a degree of colonic ileus. Findings do not appear consistent with obstruction. No free air. Evidence of hepatic megaly and splenomegaly. Calcifications in the mid abdomen probably represent calcified mesenteric lymph nodes. Evidence of pulmonary arterial hypertension. Borderline cardiomegaly. No parenchymal lung edema or consolidation. Electronically Signed   By: Lowella Grip III M.D.   On: 11/06/2015 19:28     STUDIES:  CT abd acute w chest 5/17 > Suspect a degree of colonic ileus. Findings do not appear consistent with obstruction. No free air. Evidence of hepatic megaly and splenomegaly. Calcifications in the mid abdomen probably represent calcified mesenteric lymph nodes. Evidence of pulmonary arterial hypertension. Borderline cardiomegaly. No parenchymal lung edema or consolidation. CT abdomen 5/17 > Pneumatosis of distal small bowel  loops with portal venous gas is highly concerning for ischemic bowel. Clinical correlation and surgical consult is advised. No pneumoperitoneum identified at this time.  CULTURES: Blood 5/17 > Urine 5/17 >  ANTIBIOTICS: Zosyn 5/17 > Vancomycin 5/17 >  SIGNIFICANT EVENTS:   LINES/TUBES: ETT 5/18>  DISCUSSION:   ASSESSMENT / PLAN:  PULMONARY A: Inability to protect airway due to polysubstance intoxication Pulmonary Hypertension on sildenafil and tyvaso  P:   Intubated in ED prior to transport Full vent support ABG Vent bundle  CARDIOVASCULAR A:  Shock, septic vs PED5 overdose induced NSTEMI QTc Prolongation H/o Hypertension  P:  Telemetry monitoring Echo MAP goal > 65 Levophed for MAP goal Place CVL and check CVP 30cc/kg + given PTA Co-ox Heparin per pharmacy  RENAL A:   AKI secondary to rhabdomyolysis  NAG acidosis  P:   Serial BMP HCO3 infusion  GASTROINTESTINAL A:   Pneumatosis and portal venous gas concerning for ischemic bowel Constipation 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 Transaminitis  P:   NPO for tonight Ammonia Protonix NGT to LIS Surgery aware  HEMATOLOGIC A:   Hemoconcentration Thrombocytopenia  P:  Follow CBC Sq heparin SCDs  INFECTIOUS A:   SIRS/Sepsis source,  potentially intraabdominal source, UTI 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 HIV > has not been on HAART therapy since 05/2015   P:   Broad ABX as above Cultures as above PCT Follow WBC and fever curve Consult ID in AM Triumeq on hold with hepatic dysfunction (apparently he wasn't taking this anyway)  ENDOCRINE A:   No acute issues   P:   Follow glucose on chemistry  NEUROLOGIC A:   Acute metabolic encephalopathy P:   RASS goal: -1 Propofol   FAMILY  - Updates:   - Inter-disciplinary family meet or Palliative Care meeting due by:  5/24   Georgann Housekeeper, AGACNP-BC [place]  Pulmonology/Critical Care Pager [phone] or [phone]  11/04/2015 4:13 AM    STAFF NOTE: Linwood Dibbles, MD FACP have personally reviewed patient's available data, including medical history, events of note, physical examination and test results as part of my evaluation. I have discussed with resident/NP and other care providers such as pharmacist, RN and RRT. In addition, I personally evaluated patient and elicited key findings of: sedated, lung bases clear, tender abdomen, clinically this is drug abuse (cocaine neg, repeat it?) r/o endocarditis resulting in ischemic bowel and MODS now, ABG noted, increase MV , can  Tolerate 8 cc/kg, no ards at this time, repeat abg, requires a line, appreciate Gen surgery assistance, likely to OR now, add empiric diflucan to Vosyn for  now as unable to r/o perf , no contrast on CT, non AG acidosis, avoid saline, continued bicarb, get cvp, volume resus, INR just back get FFP stat and vit K, ppi add, SSI, cortisol now and then empiric steroids and use cut off 25 for cortisol with HIV, get echo for veg?, will assess surgeons impression of distribution fo dead bowel in OR, NO vasopressin with mesenteric ischemia risk, may need ddavp if bleeding in OR would use 20 micrograms over 30 min , get frequent chem, likely atn The patient is critically ill with multiple organ systems failure and requires high complexity decision making for assessment and support, frequent evaluation and titration of therapies, application of advanced monitoring technologies and extensive interpretation of multiple databases.   Critical Care Time devoted to patient care services described in this note is35  Minutes. This time reflects time of care of this signee: Merrie Roof, MD FACP. This critical care time does not reflect procedure time, or teaching time or supervisory time of PA/NP/Med student/Med Resident etc but could involve care discussion time. Rest per NP/medical resident  whose note is outlined above and that I agree with   Lavon Paganini. Titus Mould, MD, [place] Pgr: [place] [facility]monary & Critical Care 11/06/2015 6:06 AM

## 2015-11-11 NOTE — ED Provider Notes (Signed)
Patient signed out by Dr. Ashok Cordia. Currently being evaluated by Dr. Myna Hidalgo and Dr. Rosana Hoes.  Decision made to transfer to [facility] for critical care evaluation. Discussion [facility].  ICU accepting Dr. Titus Mould.  They are requesting intubation. On my evaluation, patient is delirious and altered. Edentulous. Patient was intubated without difficulty. Chest x-ray shows appropriate placement of ET tube. Awaiting transfer. Patient was placed on a propofol drip for sedation.   INTUBATION Performed by: Merryl Hacker  Required items: required blood products, implants, devices, and special equipment available Patient identity confirmed: provided demographic data and hospital-assigned identification number Time out: Immediately prior to procedure a "time out" was called to verify the correct patient, procedure, equipment, support staff and site/side marked as required.  Indications: AMS, transfer  Intubation method: Direct Laryngoscopy   Preoxygenation: [place]  Sedatives: Etomidate Paralytic: Rocuronium  Tube Size: 7.5 cuffed  Post-procedure assessment: chest rise and ETCO2 monitor Breath sounds: equal and absent over the epigastrium Tube secured with: ETT holder Chest x-ray interpreted by radiologist and me.  Chest x-ray findings: endotracheal tube in appropriate position  Patient tolerated the procedure well with no immediate complications.     Merryl Hacker, MD 11/23/2015 [phone]

## 2015-11-11 NOTE — Progress Notes (Signed)
Patient ID: Casey Pennington, male   DOB: 02/07/1956, 60 y.o.   MRN: [id number] S/P extensive polysubstance abuse with shock and evidence of ischemic bowel n CT. Will take for emergent exploratory laparotomy, bowel resection, placement of open abdomen negative pressure wound device. He is rapidly deteriorating with hepatic and renal failure. I spoke with his mother by phone. I explained the procedure, risks, and benefits as well as Casey Pennington's very high mortality risk. She agrees and consent documented. Additionally, she reports Casey Pennington has speant most of his life in "federal medical facilities", most recently [facility] for 2 years up until Christmas when he came home to live with her. Georganna Skeans, MD, MPH, FACS Trauma: [phone] General Surgery: [phone]

## 2015-11-11 NOTE — ED Notes (Signed)
Cms remains intact all extremities,

## 2015-11-11 NOTE — Progress Notes (Signed)
RT attempted aline x 2 without success. Spoke with Dr. Halford Chessman who said he could have one placed in the OR. RT will monitor as needed

## 2015-11-11 NOTE — Procedures (Signed)
Central Venous Catheter Insertion Procedure Note Casey Pennington [id number] July 24, 1955  Procedure: Insertion of Central Venous Catheter Indications: Assessment of intravascular volume, Drug and/or fluid administration and Frequent blood sampling  Procedure Details Consent: Unable to obtain consent because of emergent medical necessity. Time Out: Verified patient identification, verified procedure, site/side was marked, verified correct patient position, special equipment/implants available, medications/allergies/relevent history reviewed, required imaging and test results available.  Performed  Maximum sterile technique was used including antiseptics, cap, gloves, gown, hand hygiene, mask and sheet. Skin prep: Chlorhexidine; local anesthetic administered A antimicrobial bonded/coated triple lumen catheter was placed in the left internal jugular vein using the Seldinger technique. Ultrasound guidance used.Yes.   Catheter placed to 20 cm. Blood aspirated via all 3 ports and then flushed x 3. Line sutured x 2 and dressing applied.  Evaluation Blood flow good Complications: No apparent complications Patient did tolerate procedure well. Chest X-ray ordered to verify placement.  CXR: pending.  Casey Pennington, AGACNP-BC [facility] Pulmonology/Critical Care Pager [phone] or [phone]  11/19/2015 5:00 AM  Tolerated well Shock Emergency need [place]  Casey Borg J. Titus Mould, MD, [place] Pgr: [place] [facility] 10/30/2015 6:05 AM

## 2015-11-11 NOTE — ED Notes (Signed)
CRITICAL VALUE ALERT  Critical value received:  Ph 7.11  Date of notification:  11/23/2015  Time of notification:  01:20  Critical value read back: yes  Nurse who received alert:  Rip Harbour RN   MD notified (1st page):  Dr Dina Rich  Time of first page:  01:25  MD notified (2nd page):  Time of second page:  Responding MD:  Dr. Dina Rich Time MD responded: 01:25

## 2015-11-11 NOTE — Transfer of Care (Signed)
Immediate Anesthesia Transfer of Care Note  Patient: Casey Pennington  Procedure(s) Performed: Procedure(s): EXPLORATORY LAPAROTOMY (N/A) SMALL BOWEL RESECTION (N/A) APPLICATION OF ABDOMENAL WOUND VAC (N/A)  Patient Location: ICU  Anesthesia Type:General  Level of Consciousness: sedated  Airway & Oxygen Therapy: Patient remains intubated per anesthesia plan and Patient placed on Ventilator (see vital sign flow sheet for setting)  Post-op Assessment: Report given to RN and Post -op Vital signs reviewed and stable  Post vital signs: Reviewed and stable  Last Vitals:  Filed Vitals:   11/07/2015 0900 10/31/2015 1130  BP: 93/73   Pulse: 126 103  Temp: 37.6 C   Resp: 20 20    Last Pain: There were no vitals filed for this visit.       Complications: No apparent anesthesia complications

## 2015-11-11 NOTE — ED Notes (Signed)
Mother notified of pt being moved to [facility],

## 2015-11-11 NOTE — Progress Notes (Signed)
Pt in OR during 1000 restraint assessment. Was not completed.

## 2015-11-11 NOTE — ED Notes (Signed)
Soft wrist and leg restraints applied due to agitation, cms intact all extremities post application,

## 2015-11-11 NOTE — Progress Notes (Signed)
0700 ABG also not obtained due to no aline. Aline to be placed on OR and ABG obtained then

## 2015-11-11 NOTE — Progress Notes (Signed)
  Echocardiogram 2D Echocardiogram has been performed.  Casey Pennington 10/28/2015, 2:08 PM

## 2015-11-11 NOTE — Progress Notes (Signed)
[facility] Progress Note Patient Name: EDVARDO HONSE DOB: 07-01-1955 MRN: [id number]   Date of Service  11/07/2015  HPI/Events of Note  60 yo HIV pos drug user with septic shock possibel ischemic gut, with rhabdo, renal failure acidosis Cont vent support, vasopressors, abx, may need CRRT, follow up gen surgery recs  eICU Interventions  Vent support, CVL, vasopressors, bicarb infusion, follow up all labs Follow up gen surg recs, may need CRRT     Intervention Category Major Interventions: Respiratory failure - evaluation and management  Merrisa Skorupski 11/21/2015, 5:01 AM

## 2015-11-11 NOTE — Care Management Note (Signed)
Case Management Note  Patient Details  Name: Casey Pennington MRN: [id number] Date of Birth: 11-07-1955  Subjective/Objective:    Pt admitted with rhabdomyolysis post drug overdose                Action/Plan:  PTA pt from home with parents - with hx of substance abuse.  Pt found to be in respiratory distress post 2 day drug binge at friends home.  Pt is currently intubated on continuous sedation and pressors.  CSW informed of events leading to admit - tentative referral post pts extubation.  CM /CSW will continue to follow for discharge needs   Expected Discharge Date:                  Expected Discharge Plan:     In-House Referral:  Clinical Social Work  Discharge planning Services  CM Consult  Post Acute Care Choice:    Choice offered to:     DME Arranged:    DME Agency:     HH Arranged:    Kevin Agency:     Status of Service:  In process, will continue to follow  Medicare Important Message Given:    Date Medicare IM Given:    Medicare IM give by:    Date Additional Medicare IM Given:    Additional Medicare Important Message give by:     If discussed at [place] of Stay Meetings, dates discussed:    Additional Comments:  Maryclare Labrador, RN 11/04/2015, 8:08 AM

## 2015-11-11 NOTE — Anesthesia Postprocedure Evaluation (Signed)
Anesthesia Post Note  Patient: Casey Pennington  Procedure(s) Performed: Procedure(s) (LRB): EXPLORATORY LAPAROTOMY (N/A) SMALL BOWEL RESECTION (N/A) APPLICATION OF ABDOMENAL WOUND VAC (N/A)  Patient location during evaluation: ICU Anesthesia Type: General Level of consciousness: patient remains intubated per anesthesia plan Pain management: pain level controlled Vital Signs Assessment: vitals unstable Respiratory status: patient on ventilator - see flowsheet for VS and patient remains intubated per anesthesia plan Cardiovascular status: tachycardic and unstable Anesthetic complications: no    Last Vitals:  Filed Vitals:   11/12/2015 1200 11/24/2015 1300  BP: 142/104 99/71  Pulse: 104 111  Temp:  35.8 C  Resp: 20 20    Last Pain: There were no vitals filed for this visit.               Chaston Bradburn COKER

## 2015-11-11 NOTE — Op Note (Signed)
11/04/2015 - 11/14/2015  10:54 AM  PATIENT:  Casey Pennington  60 y.o. male  PRE-OPERATIVE DIAGNOSIS:  Ischemic bowel, cirrhosis, acute kidney injury, sepsis  POST-OPERATIVE DIAGNOSIS: 1. Same 2. Ischemic distal ileum with patchy necrosis 3. Perforated Meckel's diverticulum  PROCEDURE:  Procedure(s): EXPLORATORY LAPAROTOMY SMALL BOWEL RESECTION APPLICATION ABTHERA OPEN ABDOMEN VAC  SURGEON:  Surgeon(s): Georganna Skeans, MD  ASSISTANTS: Forest Gleason, M.D.   ANESTHESIA:   general  EBL:  Total I/O In: 1932.3 [I.V.:567.3; Blood:1265; IV Piggyback:100] Out: -   BLOOD ADMINISTERED:4u FFP  DRAINS: Open abdomen VAC   SPECIMEN:  Excision  DISPOSITION OF SPECIMEN:  PATHOLOGY  COUNTS:  YES  DICTATION: .Dragon Dictation Findings: Distal ileum had frank necrosis with patchy severe ischemia for about the distal 3-4 feet. Additionally he had a Meckel's diverticulum which was perforated, likely due to local ischemia. I doubt this was the primary process. Remainder of small bowel had some mild patchy ischemia but no other frank necrosis. Stomach and colon were viable. Liver severely micronodular.  Procedure in detail: Casey Pennington is brought for emergent exploratory laparotomy. Informed consent was obtained from his mother. He was brought directly from the intensive care unit to the operating room. He is on IV antibiotics. He received fresh frozen plasma for coagulopathy correction. Additional lines were placed by anesthesia. His abdomen was prepped and draped in sterile fashion. Timeout procedure was performed. Midline incision was made using cautery in light of his HIV and hepatitis C status. Soft tissues tissues were dissected out revealing the anterior fascia. This was divided along the midline and the peritoneal cavity was opened to the likely incision. Exploration revealed ischemic distal ileum as demonstrated on his CAT scan. The small bowel was ischemic right up to the terminal ileum at the  ileocecal valve. The small bowel was run back to the ligament of Treitz. After about 3 or 4 feet of ischemia and necrosis the remainder of the bowel, while had some patchy ischemia, did not have any frank necrosis. Right colon, transverse colon, left colon, rectosigmoid: Viable. Stomach was viable. Liver was very firm and micronodular. Small bowel was divided at the point of acceptable viability on the ileum with a GIA-75 stapler. Mesentery was taken down using the LigaSure down to the terminal ileum. It was divided at the ileocecal valve with a GIA-75 stapler. Specimen was labeled for pathology and sent, including a perforated Meckel's diverticulum. The remainder of the small bowel was reinspected and had not changed in its appearance. He received ongoing resuscitation by anesthesia and we irrigated his abdomen with multiple liters of warm saline. Decision was made for damage control as planned and return to the OR in 48 hours for second look and possible further small bowel resection versus anastomosis and possible closure. Remainder of irrigation was removed. Very meticulous hemostasis was ensured due to his coagulopathic status. The skin was prepped and the Abthera open abdomen VAC device was applied and standard fashion. The inner drape was trimmed slightly and tucked around all of the bowel. 2 blue sponges were fashioned on top of that followed by VAC draping. It was hooked up to the pump an excellent seal was obtained. He tolerated the procedure without apparent complication. He remains in critical condition and was taken directly back to intensive care unit. PATIENT DISPOSITION:  ICU - intubated and critically ill.   Delay start of Pharmacological VTE agent (>24hrs) due to surgical blood loss or risk of bleeding:  yes  Georganna Skeans, MD, MPH, FACS  Pager: [phone]  5/18/201710:54 AM

## 2015-11-11 NOTE — ED Notes (Signed)
Mother's contact information [place]

## 2015-11-11 NOTE — ED Notes (Signed)
Restraints removed in preparation of intubation

## 2015-11-12 ENCOUNTER — Inpatient Hospital Stay (HOSPITAL_COMMUNITY): Payer: Medicaid Other

## 2015-11-12 ENCOUNTER — Encounter (HOSPITAL_COMMUNITY): Payer: Self-pay | Admitting: General Surgery

## 2015-11-12 DIAGNOSIS — J9600 Acute respiratory failure, unspecified whether with hypoxia or hypercapnia: Secondary | ICD-10-CM | POA: Insufficient documentation

## 2015-11-12 DIAGNOSIS — J9601 Acute respiratory failure with hypoxia: Secondary | ICD-10-CM

## 2015-11-12 LAB — CBC
HEMATOCRIT: 37.2 % — AB (ref 39.0–52.0)
HEMOGLOBIN: 12.3 g/dL — AB (ref 13.0–17.0)
MCH: 32.6 pg (ref 26.0–34.0)
MCHC: 33.1 g/dL (ref 30.0–36.0)
MCV: 98.7 fL (ref 78.0–100.0)
Platelets: 38 10*3/uL — ABNORMAL LOW (ref 150–400)
RBC: 3.77 MIL/uL — ABNORMAL LOW (ref 4.22–5.81)
RDW: 16.8 % — ABNORMAL HIGH (ref 11.5–15.5)
WBC: 13.9 10*3/uL — ABNORMAL HIGH (ref 4.0–10.5)

## 2015-11-12 LAB — RENAL FUNCTION PANEL
Albumin: 2.1 g/dL — ABNORMAL LOW (ref 3.5–5.0)
Anion gap: 19 — ABNORMAL HIGH (ref 5–15)
BUN: 120 mg/dL — ABNORMAL HIGH (ref 6–20)
CHLORIDE: 102 mmol/L (ref 101–111)
CO2: 25 mmol/L (ref 22–32)
CREATININE: 6 mg/dL — AB (ref 0.61–1.24)
Calcium: 6 mg/dL — CL (ref 8.9–10.3)
GFR calc non Af Amer: 9 mL/min — ABNORMAL LOW (ref >60)
GFR, EST AFRICAN AMERICAN: 11 mL/min — AB (ref >60)
Glucose, Bld: 127 mg/dL — ABNORMAL HIGH (ref 65–99)
POTASSIUM: 4.7 mmol/L (ref 3.5–5.1)
Phosphorus: 6.5 mg/dL — ABNORMAL HIGH (ref 2.5–4.6)
Sodium: 146 mmol/L — ABNORMAL HIGH (ref 135–145)

## 2015-11-12 LAB — BASIC METABOLIC PANEL
Anion gap: 20 — ABNORMAL HIGH (ref 5–15)
BUN: 105 mg/dL — AB (ref 6–20)
CHLORIDE: 101 mmol/L (ref 101–111)
CO2: 24 mmol/L (ref 22–32)
CREATININE: 5.48 mg/dL — AB (ref 0.61–1.24)
Calcium: 6.2 mg/dL — CL (ref 8.9–10.3)
GFR calc Af Amer: 12 mL/min — ABNORMAL LOW (ref >60)
GFR calc non Af Amer: 10 mL/min — ABNORMAL LOW (ref >60)
Glucose, Bld: 109 mg/dL — ABNORMAL HIGH (ref 65–99)
Potassium: 4.8 mmol/L (ref 3.5–5.1)
Sodium: 145 mmol/L (ref 135–145)

## 2015-11-12 LAB — POCT I-STAT 7, (LYTES, BLD GAS, ICA,H+H)
Acid-base deficit: 11 mmol/L — ABNORMAL HIGH (ref 0.0–2.0)
Bicarbonate: 17.3 mEq/L — ABNORMAL LOW (ref 20.0–24.0)
Calcium, Ion: 0.48 mmol/L — CL (ref 1.12–1.23)
HEMATOCRIT: 35 % — AB (ref 39.0–52.0)
HEMOGLOBIN: 11.9 g/dL — AB (ref 13.0–17.0)
O2 SAT: 95 %
POTASSIUM: 4.3 mmol/L (ref 3.5–5.1)
Patient temperature: 35.3
SODIUM: 147 mmol/L — AB (ref 135–145)
TCO2: 19 mmol/L (ref 0–100)
pCO2 arterial: 43.9 mmHg (ref 35.0–45.0)
pH, Arterial: 7.195 — CL (ref 7.350–7.450)
pO2, Arterial: 88 mmHg (ref 80.0–100.0)

## 2015-11-12 LAB — GLUCOSE, CAPILLARY
GLUCOSE-CAPILLARY: 115 mg/dL — AB (ref 65–99)
GLUCOSE-CAPILLARY: 116 mg/dL — AB (ref 65–99)
GLUCOSE-CAPILLARY: 120 mg/dL — AB (ref 65–99)
Glucose-Capillary: 116 mg/dL — ABNORMAL HIGH (ref 65–99)

## 2015-11-12 LAB — PREPARE FRESH FROZEN PLASMA
UNIT DIVISION: 0
Unit division: 0
Unit division: 0
Unit division: 0

## 2015-11-12 LAB — T-HELPER CELLS (CD4) COUNT (NOT AT ARMC)
CD4 T CELL HELPER: 16 % — AB (ref 33–55)
CD4 T Cell Abs: 120 /uL — ABNORMAL LOW (ref 400–2700)

## 2015-11-12 LAB — ECHOCARDIOGRAM COMPLETE
Height: 69 in
Weight: 2299.84 oz

## 2015-11-12 LAB — HEPATIC FUNCTION PANEL
ALBUMIN: 2.2 g/dL — AB (ref 3.5–5.0)
ALT: 418 U/L — AB (ref 17–63)
AST: 725 U/L — AB (ref 15–41)
Alkaline Phosphatase: 69 U/L (ref 38–126)
BILIRUBIN TOTAL: 16.2 mg/dL — AB (ref 0.3–1.2)
Bilirubin, Direct: 11.8 mg/dL — ABNORMAL HIGH (ref 0.1–0.5)
Indirect Bilirubin: 4.4 mg/dL — ABNORMAL HIGH (ref 0.3–0.9)
Total Protein: 6 g/dL — ABNORMAL LOW (ref 6.5–8.1)

## 2015-11-12 LAB — HEPARIN LEVEL (UNFRACTIONATED): Heparin Unfractionated: <0.1 IU/mL — ABNORMAL LOW (ref 0.30–0.70)

## 2015-11-12 LAB — CK
Total CK: 16970 U/L — ABNORMAL HIGH (ref 49–397)
Total CK: 13318 U/L — ABNORMAL HIGH (ref 49–397)
Total CK: 18102 U/L — ABNORMAL HIGH (ref 49–397)

## 2015-11-12 LAB — PROTIME-INR
INR: 2.65 — AB (ref 0.00–1.49)
Prothrombin Time: 27.9 seconds — ABNORMAL HIGH (ref 11.6–15.2)

## 2015-11-12 LAB — VANCOMYCIN, RANDOM: VANCOMYCIN RM: 12 ug/mL

## 2015-11-12 MED ORDER — PRISMASOL BGK 4/2.5 32-4-2.5 MEQ/L IV SOLN
INTRAVENOUS | Status: DC
  Administered 2015-11-12 – 2015-11-21 (×58): via INTRAVENOUS_CENTRAL
  Filled 2015-11-12 (×73): qty 5000

## 2015-11-12 MED ORDER — PRISMASOL BGK 4/2.5 32-4-2.5 MEQ/L IV SOLN
INTRAVENOUS | Status: DC
  Administered 2015-11-12 – 2015-11-21 (×40): via INTRAVENOUS_CENTRAL
  Filled 2015-11-12 (×47): qty 5000

## 2015-11-12 MED ORDER — HEPARIN SODIUM (PORCINE) 1000 UNIT/ML DIALYSIS
1000.0000 [IU] | INTRAMUSCULAR | Status: DC | PRN
  Administered 2015-11-13 – 2015-11-21 (×3): 2400 [IU] via INTRAVENOUS_CENTRAL
  Filled 2015-11-12 (×2): qty 3
  Filled 2015-11-12: qty 6
  Filled 2015-11-12: qty 3
  Filled 2015-11-12 (×3): qty 6

## 2015-11-12 MED ORDER — HEPARIN (PORCINE) IN NACL 100-0.45 UNIT/ML-% IJ SOLN
1000.0000 [IU]/h | INTRAMUSCULAR | Status: DC
  Filled 2015-11-12: qty 250

## 2015-11-12 MED ORDER — SODIUM CHLORIDE 0.9 % FOR CRRT
INTRAVENOUS_CENTRAL | Status: DC | PRN
  Filled 2015-11-12: qty 1000

## 2015-11-12 MED ORDER — PNEUMOCOCCAL VAC POLYVALENT 25 MCG/0.5ML IJ INJ
0.5000 mL | INJECTION | INTRAMUSCULAR | Status: AC
  Administered 2015-11-14: 0.5 mL via INTRAMUSCULAR
  Filled 2015-11-12: qty 0.5

## 2015-11-12 MED ORDER — HEPARIN 1000 UNIT/ML FOR PERITONEAL DIALYSIS
1.2000 mL | INTRAMUSCULAR | Status: DC | PRN
  Filled 2015-11-12: qty 1.2

## 2015-11-12 MED ORDER — PRISMASOL BGK 4/2.5 32-4-2.5 MEQ/L IV SOLN
INTRAVENOUS | Status: DC
  Administered 2015-11-12 – 2015-11-21 (×17): via INTRAVENOUS_CENTRAL
  Filled 2015-11-12 (×19): qty 5000

## 2015-11-12 MED ORDER — INSULIN ASPART 100 UNIT/ML ~~LOC~~ SOLN
0.0000 [IU] | SUBCUTANEOUS | Status: DC
Start: 2015-11-12 — End: 2015-11-22
  Administered 2015-11-13 – 2015-11-20 (×7): 1 [IU] via SUBCUTANEOUS

## 2015-11-12 MED ORDER — VANCOMYCIN HCL 10 G IV SOLR
1250.0000 mg | Freq: Once | INTRAVENOUS | Status: AC
  Administered 2015-11-12: 1250 mg via INTRAVENOUS
  Filled 2015-11-12 (×2): qty 1250

## 2015-11-12 MED ORDER — VANCOMYCIN HCL IN DEXTROSE 750-5 MG/150ML-% IV SOLN
750.0000 mg | INTRAVENOUS | Status: DC
  Administered 2015-11-13 – 2015-11-17 (×5): 750 mg via INTRAVENOUS
  Filled 2015-11-12 (×5): qty 150

## 2015-11-12 MED ORDER — PIPERACILLIN-TAZOBACTAM IN DEX 2-0.25 GM/50ML IV SOLN
2.2500 g | Freq: Four times a day (QID) | INTRAVENOUS | Status: DC
  Administered 2015-11-12 – 2015-11-18 (×23): 2.25 g via INTRAVENOUS
  Filled 2015-11-12 (×26): qty 50

## 2015-11-12 NOTE — Progress Notes (Signed)
Wasted 41m versed in sink with MYevonne AlineRN

## 2015-11-12 NOTE — Progress Notes (Signed)
Initial Nutrition Assessment  DOCUMENTATION CODES:   Not applicable  INTERVENTION:   Likely not a candidate for enteral feeds within the short term. May require TPN, hold off for the next 5-7 days.   NUTRITION DIAGNOSIS:   Inadequate oral intake related to inability to eat as evidenced by NPO status.  GOAL:   Patient will meet greater than or equal to 90% of their needs  MONITOR:   Vent status, Labs, Weight trends, Skin, I & O's  REASON FOR ASSESSMENT:   Ventilator    ASSESSMENT:   Pt with PMH of HTN, hepatitis C cirrhosis, pulmonary HTN, HIV, and polysubstance abuse including injected medications. Pt had been on a 2-3 day binge of injecting and snorting various amphetamines, opiates, gabapentin, and sildenafil. In the ED the patient was found to be tachycardic despite 3 L of IV fluid resuscitation. He was also febrile and agitated. Due to SOB and abdominal pain with constipation he had CT of chest and abdomen, which demonstrated pneumatosis of distal small bowel loops.   5/18 Small bowel resection, abdominal wound vac 5/19 Per surgery plan to take pt back to OR 5/20 for possible anastamosis distal ileum to R colon.  Pt was discussed in rounds.  If recovery occurs, pt likely will need TPN. D/t small bowel resection (5 feet), pt may not be a candidate for enteral feeds in the short term.   Pt remains on vent, Temp (24hrs), Avg:99.9 F (37.7 C), Min:97.2 F (36.2 C), Max:100.2 F (37.9 C) Ve: 9.9 ml Likely will need CRRT  NFPE: Mild muscle depletion, no fat depletion, mild edema.  Labs reviewed;BUN 105, creat 5.48, Ca 6.2, GFR 10. Meds reviewed.  Diet Order:  Diet NPO time specified  Skin:  Reviewed, no issues  Last BM:  unknown  Height:   Ht Readings from Last 1 Encounters:  11/03/2015 _0  (1.753 m)    Weight:   Wt Readings from Last 1 Encounters:  11/12/15 153 lb 14.1 oz (69.8 kg)    Ideal Body Weight:  72.7 kg  BMI:  Body mass index is 22.71  kg/(m^2).  Estimated Nutritional Needs:   Kcal:  1892  Protein:  109  Fluid:  Per MD   EDUCATION NEEDS:   No education needs identified at this time  Geoffery Lyons, [place] Dietetic Intern Pager [phone]   Chart reviewed. I agree with student dietitian note.  Molli Barrows, RD, LDN, [place] Pager# [phone] After Hours Pager# [phone]
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
## 2015-11-12 NOTE — Progress Notes (Signed)
1 Day Post-Op  Subjective: On vent  Objective: Vital signs in last 24 hours: Temp:  [96.4 F (35.8 C)-100.2 F (37.9 C)] 100 F (37.8 C) (05/19 0800) Pulse Rate:  [103-127] 116 (05/19 0800) Resp:  [20-23] 20 (05/19 0800) BP: (81-146)/(54-104) 114/74 mmHg (05/19 0800) SpO2:  [86 %-100 %] 100 % (05/19 0800) Arterial Line BP: (86-126)/(47-66) 107/59 mmHg (05/19 0800) FiO2 (%):  [60 %-80 %] 60 % (05/19 0742) Weight:  [69.8 kg (153 lb 14.1 oz)] 69.8 kg (153 lb 14.1 oz) (05/19 0215)    Intake/Output from previous day: 05/18 0701 - 05/19 0700 In: 6316.8 [I.V.:4641.8; Blood:1265; NG/GT:60; IV Piggyback:350] Out: 3419 [Urine:16; Emesis/NG output:800; Drains:1000; Blood:30] Intake/Output this shift:    General appearance: mild distress Resp: few rhonchi Cardio: regular rate and rhythm GI: open abdomen VAC  Lab Results:   Recent Labs  11/21/2015 1530 11/12/15 0548  WBC 6.5 13.9*  HGB 12.1* 12.3*  HCT 38.7* 37.2*  PLT 38* 38*   BMET  Recent Labs  11/19/2015 1530 11/12/15 0548  NA 144 145  K 4.5 4.8  CL 108 101  CO2 20* 24  GLUCOSE 124* 109*  BUN 83* 105*  CREATININE 4.17* 5.48*  CALCIUM 6.9* 6.2*   PT/INR  Recent Labs  10/31/2015 0515  LABPROT 24.4*  INR 2.22*   ABG  Recent Labs  11/21/2015 1220 11/02/2015 1953  PHART 7.305* 7.310*  HCO3 20.3 24.8*    Studies/Results: Ct Abdomen Pelvis Wo Contrast  11/17/2015  CLINICAL DATA:  60 year old male with 11 mm dated 07/02/2010. EXAM: CT ABDOMEN AND PELVIS WITHOUT CONTRAST TECHNIQUE: Multidetector CT imaging of the abdomen and pelvis was performed following the standard protocol without IV contrast. COMPARISON:  None. FINDINGS: Evaluation of this exam is limited in the absence of intravenous contrast. Evaluation is also limited due to respiratory motion artifact. The visualized lung bases are clear. There is no intra-abdominal free air. No free fluid identified. Large amount of linear and branching air noted in the left  lobe of the liver compatible with portal venous gas. The liver is otherwise unremarkable. The gallbladder is mildly distended. No calcified stone or pericholecystic fluid identified. The pancreas, spleen, adrenal glands, kidneys, visualized ureters appear unremarkable. The urinary bladder is collapsed. The prostate and seminal vesicles are grossly unremarkable. Multiple top-normal caliber loops of small bowel noted throughout the abdomen with air-fluid levels, likely a degree of ileus. There is air within the wall of the distal small bowel loop within the pelvis compatible with pneumatosis. The appendix is unremarkable. There is moderate aortoiliac atherosclerotic disease. There is gas within the central SMV extending into the main portal vein. There is no adenopathy. The abdominal wall soft tissues appear unremarkable. There is osteopenia with degenerative changes of the spine. Right femoral fixation hardware as well as evidence of prior left femoral intra medullary rod and screw noted. No acute fracture. IMPRESSION: Pneumatosis of distal small bowel loops with portal venous gas is highly concerning for ischemic bowel. Clinical correlation and surgical consult is advised. No pneumoperitoneum identified at this time. These results were called by telephone at the time of interpretation on 11/02/2015 at 9:48 pm to Dr. Lajean Saver , who verbally acknowledged these results. Electronically Signed   By: Anner Crete M.D.   On: 11/02/2015 21:52   Ct Head Wo Contrast  11/02/2015  CLINICAL DATA:  Altered mental status; drug overdose EXAM: CT HEAD WITHOUT CONTRAST TECHNIQUE: Contiguous axial images were obtained from the base of the skull through  the vertex without intravenous contrast. COMPARISON:  None. FINDINGS: The ventricles are normal in size and configuration. There is no intracranial mass, hemorrhage, extra-axial fluid collection, or midline shift. Gray-white compartments appear within normal limits. No acute  infarct evident bony calvarium appears intact. The visualized mastoid air cells are clear. There is debris in the right external auditory canal. Visualized orbits appear symmetric bilaterally. IMPRESSION: Probable cerumen in the right external auditory canal. No intracranial mass, hemorrhage, or focal gray -white compartment lesions/acute appearing infarct. Electronically Signed   By: Lowella Grip III M.D.   On: 10/29/2015 19:04   Dg Chest Port 1 View  11/21/2015  CLINICAL DATA:  Central line placement.  Postop chest. EXAM: PORTABLE CHEST 1 VIEW COMPARISON:  Chest x-ray from earlier same day. FINDINGS: Endotracheal tube is adequately positioned with tip approximately 3.5 cm above the carina. Left IJ central line is stable in position with tip overlying the mid SVC. New left subclavian central line now in place with tip overlying the m upper id SVC. Nasogastric tube passes below the diaphragm. Again noted are central pulmonary arteries bilaterally suggesting chronic pulmonary artery hypertension. Heart size is upper normal, stable. Overall cardiomediastinal silhouette is stable in size and configuration. Probable mild atelectasis at the right lung base. Lungs otherwise clear. No pleural effusion. No evidence of volume overload. No pneumothorax seen. IMPRESSION: 1. New left subclavian central line in place with tip adequately positioned at the level of the upper SVC. 2. Previous left IJ central line stable in position with tip at the level of the mid SVC. 3. Probable mild atelectasis at the right lung base. Lungs otherwise clear. No pneumothorax seen. Electronically Signed   By: Franki Cabot M.D.   On: 10/29/2015 13:57   Dg Chest Port 1 View  11/01/2015  CLINICAL DATA:  Central line placement EXAM: PORTABLE CHEST 1 VIEW COMPARISON:  11/16/2015 FINDINGS: Endotracheal tube was placed with tip measuring 5.6 cm above the carina. Enteric tube placed with tip off the field of view but below the left  hemidiaphragm. Left central venous catheter placed with tip over the low SVC region. No pneumothorax. Shallow inspiration. Normal heart size. Prominent central pulmonary arteries suggesting pulmonary arterial hypertension. No focal airspace disease or consolidation in the lungs. No blunting of costophrenic angles. IMPRESSION: Appliances appear in satisfactory position. Enlarged central pulmonary arteries suggesting pulmonary arterial hypertension. Electronically Signed   By: Lucienne Capers M.D.   On: 11/07/2015 05:16   Dg Chest Portable 1 View  11/12/2015  CLINICAL DATA:  Post intubation, drug overdose, hypertension, HIV, smoker, cirrhosis, hepatitis-C, pulmonary hypertension EXAM: PORTABLE CHEST 1 VIEW COMPARISON:  11/08/2015 FINDINGS: Tip of endotracheal tube projects 4.3 cm above carina. Normal heart size and mediastinal contours. Enlarged central pulmonary arteries consistent with pulmonary arterial hypertension. RIGHT basilar atelectasis. Lungs otherwise clear. No pleural effusion or pneumothorax. Bony excrescence at LEFT first costochondral junction unchanged. No acute osseous findings. IMPRESSION: Significantly enlarged central pulmonary arteries consistent with pulmonary arterial hypertension. Minimal RIGHT basilar atelectasis. Electronically Signed   By: Lavonia Dana M.D.   On: 10/27/2015 00:36   Dg Chest Port 1 View  11/02/2015  CLINICAL DATA:  60 year old male with altered mental status EXAM: PORTABLE CHEST 1 VIEW COMPARISON:  None. FINDINGS: Single portable of the chest does not demonstrate focal consolidation. There is no pleural effusion. A linear lucency along the periphery of the right upper lobe at the mid clavicular region may be skin fold artifact. A small pneumothorax is not excluded.  Repeat radiograph may provide better evaluation. Top-normal cardiac size. There is mild prominence of the hilar vasculature without evidence of pulmonary edema or congestion. Old healed left clavicular  fracture. No acute osseous pathology identified. IMPRESSION: Skin fold artifact versus a small right upper lobe pneumothorax. Repeat radiograph recommended for further evaluation. Electronically Signed   By: Anner Crete M.D.   On: 11/17/2015 18:17   Dg Abd Acute W/chest  11/14/2015  CLINICAL DATA:  Shortness of Breath. Abdominal pain with constipation EXAM: DG ABDOMEN ACUTE W/ 1V CHEST COMPARISON:  Chest radiograph obtained earlier in the day FINDINGS: PA chest: There is no edema or consolidation. There is borderline cardiomegaly. There is pulmonary arterial hypertension with prominence of the central pulmonary arteries and rapid peripheral tapering bilaterally. No adenopathy evident. Supine and upright abdomen: There is moderate colonic dilatation. There is no appreciable small bowel dilatation. No air-fluid levels. No free air evident. Liver and spleen appear enlarged. There are calcifications in the mid abdomen. IMPRESSION: Suspect a degree of colonic ileus. Findings do not appear consistent with obstruction. No free air. Evidence of hepatic megaly and splenomegaly. Calcifications in the mid abdomen probably represent calcified mesenteric lymph nodes. Evidence of pulmonary arterial hypertension. Borderline cardiomegaly. No parenchymal lung edema or consolidation. Electronically Signed   By: Lowella Grip III M.D.   On: 11/20/2015 19:28    Anti-infectives: Anti-infectives    Start     Dose/Rate Route Frequency Ordered Stop   11/12/15 2100  vancomycin (VANCOCIN) IVPB 1000 mg/200 mL premix     1,000 mg 200 mL/hr over 60 Minutes Intravenous Every 48 hours 10/27/2015 0622     11/05/2015 2000  vancomycin (VANCOCIN) IVPB 750 mg/150 ml premix  Status:  Discontinued     750 mg 150 mL/hr over 60 Minutes Intravenous Every 24 hours 11/05/2015 2128 11/23/2015 0622   10/26/2015 1400  piperacillin-tazobactam (ZOSYN) IVPB 2.25 g     2.25 g 100 mL/hr over 30 Minutes Intravenous Every 8 hours 11/16/2015 0622      10/29/2015 1000  abacavir-dolutegravir-lamiVUDine (TRIUMEQ) 600-50-300 MG per tablet 1 tablet  Status:  Discontinued     1 tablet Oral Daily 11/03/2015 2112 11/08/2015 0517   11/16/2015 0700  fluconazole (DIFLUCAN) IVPB 200 mg     200 mg 100 mL/hr over 60 Minutes Intravenous Daily 11/01/2015 0621     11/17/2015 0600  piperacillin-tazobactam (ZOSYN) IVPB 3.375 g  Status:  Discontinued     3.375 g 12.5 mL/hr over 240 Minutes Intravenous Every 8 hours 10/30/2015 2128 11/05/2015 0622   10/29/2015 2130  vancomycin (VANCOCIN) IVPB 1000 mg/200 mL premix     1,000 mg 200 mL/hr over 60 Minutes Intravenous  Once 11/06/2015 2112 11/19/2015 2302   11/24/2015 2115  piperacillin-tazobactam (ZOSYN) IVPB 3.375 g     3.375 g 100 mL/hr over 30 Minutes Intravenous  Once 10/25/2015 2112 11/16/2015 2216   10/27/2015 2000  piperacillin-tazobactam (ZOSYN) IVPB 3.375 g  Status:  Discontinued    Comments:  Give as soon as cultures drawn   3.375 g 100 mL/hr over 30 Minutes Intravenous  Once 11/01/2015 1953 11/07/2015 2123   11/19/2015 2000  vancomycin (VANCOCIN) IVPB 1000 mg/200 mL premix  Status:  Discontinued    Comments:  Give as soon as cultures drawn   1,000 mg 200 mL/hr over 60 Minutes Intravenous  Once 11/24/2015 1953 11/09/2015 2123      Assessment/Plan: s/p Procedure(s): EXPLORATORY LAPAROTOMY (N/A) SMALL BOWEL RESECTION (N/A) APPLICATION OF ABDOMENAL WOUND VAC (N/A) POD1 Bowel in  discontinuity - NGT Plan second look tomorrow for possible anastamosis distal ileum to R colon Hepatorenal failure - poor prognosis and I D/W his mother yesterday, recommend CRRT today  LOS: 2 days    Casey Pennington E 11/12/2015

## 2015-11-12 NOTE — Progress Notes (Signed)
ANTICOAGULATION CONSULT NOTE - Follow Up Consult  Pharmacy Consult for heparin Indication: NSTEMI  Labs:  Recent Labs  11/23/2015 1709 10/27/2015 2045 10/29/2015 0515 11/17/2015 0915 11/16/2015 1530 11/12/15 0500 11/12/15 0548  HGB 18.3*  --  15.5  --  12.1*  --  12.3*  HCT 54.1*  --  47.1  --  38.7*  --  37.2*  PLT 122*  --  88*  --  38*  --  38*  LABPROT  --   --  24.4*  --   --   --   --   INR  --   --  2.22*  --   --   --   --   HEPARINUNFRC  --   --   --   --   --  <0.10*  --   CREATININE 3.47*  --  3.86*  --  4.17*  --   --   CKTOTAL >50000*  --   --   --   --   --   --   TROPONINI  --  8.53*  --  25.27* 27.18*  --   --      Assessment: 60yo male undetectable on heparin with initial dosing for NSTEMI though may need more time to accumulate.  Goal of Therapy:  Heparin level 0.3-0.7 units/ml   Plan:  Will increase heparin gtt by 2-3 units/kg/hr to 1000 units/hr and check level in [place], PharmD, BCPS  11/12/2015,7:14 AM

## 2015-11-12 NOTE — Progress Notes (Signed)
Critical Calcium level of 6.0 called to [facility] RN by [employer] RN

## 2015-11-12 NOTE — Procedures (Signed)
Hemodialysis Catheter Insertion Procedure Note Casey Pennington [id number] [birth year]  Procedure: Insertion of Hemodialysis Catheter Indications: Hemodialysis  Procedure Details Consent: Risks of procedure as well as the alternatives and risks of each were explained to the (patient/caregiver).  Consent for procedure obtained.  Time Out: Verified patient identification, verified procedure, site/side was marked, verified correct patient position, special equipment/implants available, medications/allergies/relevent history reviewed, required imaging and test results available.  Performed  Maximum sterile technique was used including antiseptics, cap, gloves, gown, hand hygiene, mask and sheet.  Skin prep: Chlorhexidine; local anesthetic administered  A Trialysis HD catheter was placed in the right internal jugular vein using the Seldinger technique.  Sutured in place.  Evaluation Blood flow good Complications: No apparent complications Patient did tolerate procedure well. Chest X-ray ordered to verify placement.  CXR: pending.   Procedure performed under direct supervision of Dr. Halford Chessman and with ultrasound guidance for real time vessel cannulation.     Casey Gens, NP-C  Pulmonary & Critical Care Pgr: [phone] or [phone] 11/12/2015, 2:12 PM

## 2015-11-12 NOTE — Progress Notes (Addendum)
PULMONARY / CRITICAL CARE MEDICINE   Name: Casey Pennington MRN: [id number] DOB: [birth year]    ADMISSION DATE:  11/14/2015 CONSULTATION DATE:  5/18  REFERRING MD:  [facility]  CHIEF COMPLAINT:  AMS  HISTORY OF PRESENT ILLNESS:  60 year old male with past medical history as below, which includes hypertension, hepatitis C cirrhosis, pulmonary hypertension, HIV, and polysubstance abuse including injected medications. He was last seen normal by his mother about 2 days ago at which point he went to a friend's house and had not been seen since. 5/17 the friend contacted the mother describing the patient to be acting strange/having difficulty breathing and she contacted EMS. The patient had apparently been on a 2-3 day binge of injecting and snorting various amphetamines, opiates, gabapentin, and sildenafil. In the emergency department the patient was found to be tachycardic despite 3 L of IV fluid resuscitation. He was also febrile and agitated and unable to provide any full history. Due to SOB and abdominal pain with constipation he had CT of chest and abdomen, which demonstrated pneumatosis of distal small bowel loops.   PAST MEDICAL HISTORY :  He  has a past medical history of Anemia; Arthritis; Oxygen deficiency; Hypertension; Cirrhosis of liver ([place]); Urethral stricture; BPH (benign prostatic hyperplasia); Trigger finger; Nerve pain; Pancytopenia ([place]); Pulmonary hypertension ([facility]); HIV (human immunodeficiency virus infection) ([place]); and Hep C w/o coma, chronic ([place]).  PAST SURGICAL HISTORY: He  has past surgical history that includes Fracture surgery.  No Known Allergies  FAMILY HISTORY:  His indicated that his mother is alive. He indicated that his father is alive.   SOCIAL HISTORY: He  reports that he has been smoking Cigarettes.  He has been smoking about 0.25 packs per day. He does not have any smokeless tobacco history on file. He reports that he uses illicit drugs (IV and  Methamphetamines). He reports that he does not drink alcohol.  REVIEW OF SYSTEMS:   Unable pt intubated   SUBJECTIVE:  Pt sedated with fentanyl on the vent opens eyes to verbal stimulation does not follow commands. Spoke with RN pt having periods where heart rate increases to 170's however he recovers quickly without intervention.  He remains anuric  VITAL SIGNS: BP 110/77 mmHg  Pulse 114  Temp(Src) 100.2 F (37.9 C) (Core (Comment))  Resp 20  Ht _0  (1.753 m)  Wt 153 lb 14.1 oz (69.8 kg)  BMI 22.71 kg/m2  SpO2 100%  HEMODYNAMICS: CVP:  [5 mmHg-10 mmHg] 5 mmHg  VENTILATOR SETTINGS: Vent Mode:  [-] PRVC FiO2 (%):  [60 %-80 %] 60 % Set Rate:  [20 bmp] 20 bmp Vt Set:  [570 mL] 570 mL PEEP:  [5 cmH20] 5 cmH20 Plateau Pressure:  [16 cmH20-19 cmH20] 17 cmH20  INTAKE / OUTPUT: I/O last 3 completed shifts: In: 6934.7 [I.V.:5159.7; Blood:1265; NG/GT:60; IV Piggyback:450] Out: 2482 [Urine:16; Emesis/NG output:800; Drains:1000; Blood:30]  PHYSICAL EXAMINATION: General:  Malnourished male in NAD on vent Neuro:  Sedated and opens eyes spontaneously, does not follow commands HEENT:  [place]/AT, PERRL, sclera yellow, no JVD Cardiovascular:  Tachy, regular rhythm. No obvious murmur Lungs:  Clear bilateral breath sounds, Abdomen:  Open abdomen vac, tender, soft, non-distended, Musculoskeletal:  No acute deformity, 2+ generalized edema  Skin:  Track marks BUE  LABS:  BMET  Recent Labs Lab 11/12/2015 0515 10/25/2015 1530 11/12/15 0548  NA 144 144 145  K 5.3* 4.5 4.8  CL 112* 108 101  CO2 16* 20* 24  BUN 70* 83* 105*  CREATININE 3.86* 4.17* 5.48*  GLUCOSE 94 124* 109*    Electrolytes  Recent Labs Lab 11/06/2015 0515 10/30/2015 1530 11/12/15 0548  CALCIUM 6.9* 6.9* 6.2*  MG 2.3  --   --   PHOS 7.7*  --   --     CBC  Recent Labs Lab 11/04/2015 0515 11/05/2015 1530 11/12/15 0548  WBC 12.2* 6.5 13.9*  HGB 15.5 12.1* 12.3*  HCT 47.1 38.7* 37.2*  PLT 88* 38* 38*     Coag's  Recent Labs Lab 11/03/2015 0515  INR 2.22*    Sepsis Markers  Recent Labs Lab 11/02/2015 2045 11/17/2015 2115 11/24/2015 0535  LATICACIDVEN  --  2.2* 2.6*  PROCALCITON 1.69  --   --     ABG  Recent Labs Lab 10/26/2015 0525 11/16/2015 1220 10/27/2015 1953  PHART 7.222* 7.305* 7.310*  PCO2ART 35.1 40.1 49.7*  PO2ART 245* 144.0* 94.0    Liver Enzymes  Recent Labs Lab 11/04/2015 2045 11/12/2015 0515  AST 1554* 1191*  ALT 359* 372*  ALKPHOS 116 97  BILITOT 1.7* 5.7*  ALBUMIN 4.1 2.7*    Cardiac Enzymes  Recent Labs Lab 10/26/2015 2045 11/06/2015 0915 11/03/2015 1530  TROPONINI 8.53* 25.27* 27.18*    Glucose  Recent Labs Lab 11/14/2015 1655 11/05/2015 0235 11/16/2015 0831 11/20/2015 1953 11/12/15 0831  GLUCAP 98 96 90 98 115*    Imaging Dg Chest Port 1 View  11/02/2015  CLINICAL DATA:  Central line placement.  Postop chest. EXAM: PORTABLE CHEST 1 VIEW COMPARISON:  Chest x-ray from earlier same day. FINDINGS: Endotracheal tube is adequately positioned with tip approximately 3.5 cm above the carina. Left IJ central line is stable in position with tip overlying the mid SVC. New left subclavian central line now in place with tip overlying the m upper id SVC. Nasogastric tube passes below the diaphragm. Again noted are central pulmonary arteries bilaterally suggesting chronic pulmonary artery hypertension. Heart size is upper normal, stable. Overall cardiomediastinal silhouette is stable in size and configuration. Probable mild atelectasis at the right lung base. Lungs otherwise clear. No pleural effusion. No evidence of volume overload. No pneumothorax seen. IMPRESSION: 1. New left subclavian central line in place with tip adequately positioned at the level of the upper SVC. 2. Previous left IJ central line stable in position with tip at the level of the mid SVC. 3. Probable mild atelectasis at the right lung base. Lungs otherwise clear. No pneumothorax seen. Electronically  Signed   By: Franki Cabot M.D.   On: 11/05/2015 13:57     STUDIES:  CT abd acute w chest 5/17 > Suspect a degree of colonic ileus. Findings do not appear consistent with obstruction. No free air. Evidence of hepatic megaly and splenomegaly. Calcifications in the mid abdomen probably represent calcified mesenteric lymph nodes. Evidence of pulmonary arterial hypertension. Borderline cardiomegaly. No parenchymal lung edema or consolidation. CT abdomen 5/17 > Pneumatosis of distal small bowel loops with portal venous gas is highly concerning for ischemic bowel. Clinical correlation and surgical consult is advised. No pneumoperitoneum identified at this time. Echo 5/18> EF 55%-60%  CULTURES: Blood 5/17 >>MRSA 5/18 Urine 5/17 >> MRSA PCR>>positive 5/18  ANTIBIOTICS: Zosyn 5/17 >> Vancomycin 5/17 >>  SIGNIFICANT EVENTS: 5/18-Exploratory Laparotomy with small bowel resection and application of abdominal wound vac  LINES/TUBES: ETT 5/18>> Left subclavian 5/17>>  DISCUSSION: 60 year old male with past medical history as below, which includes hypertension, hepatitis C cirrhosis, pulmonary hypertension, HIV, and polysubstance abuse including injected medications  Due to SOB  and abdominal pain with constipation he had CT of chest and abdomen, which demonstrated pneumatosis of distal small bowel loops. Pt intubated 5/18 due to inability to protect airway.     ASSESSMENT / PLAN:  PULMONARY A: Inability to protect airway due to polysubstance intoxication Pulmonary Hypertension on sildenafil and tyvaso  P:   Intubated in ED prior to transport Full vent support ABG Vent bundle  CARDIOVASCULAR A:  Shock, septic vs PED5 overdose induced NSTEMI QTc Prolongation H/o Hypertension  P: Telemetry monitoring Echo MAP goal > 65 Levophed for MAP goal CVP's q4hrs Heparin per pharmacy Cardiology will need to be consulted once pt more stable  RENAL A:   AKI secondary to rhabdomyolysis   NAG acidosis  P:   Serial BMP HCO3 infusion Nephrology consult pt may need CRRT appreciate input  GASTROINTESTINAL A:   Pneumatosis and portal venous gas concerning for ischemic bowel Constipation 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 Transaminitis  P:   NPO defer to surgery Ammonia Protonix NGT to LIS Surgery consulted appreciate input Per surgery plan to take pt back to OR 5/20 for possible anastamosis distal ileum to R colon   HEMATOLOGIC A:   Hemoconcentration Thrombocytopenia  P:  Heparin gttc Follow CBC Monitor for s/sx of bleeding SCDs  INFECTIOUS A:   SIRS/Sepsis source, MRSA in blood, potentially intraabdominal source, UTI Concern for bacterial endocarditis with IVDA history and now ischemic bowel HIV >>has not been on HAART therapy since 05/2015   P:   Broad ABX as above Cultures as above Follow WBC, PCT, and fever curve Consult ID  [email or web address] on hold with hepatic dysfunction (apparently he wasn't taking this anyway)  ENDOCRINE A:   No acute issues   P:   CBG q4hrs ICU hyperglycemic protocol if indicated  NEUROLOGIC A:   Acute metabolic encephalopathy P:   RASS goal: -1 Propofol   FAMILY  - Updates: No family at bedside to update  - Inter-disciplinary family meet or Palliative Care meeting due by:  5/24   Marda Stalker, [place]

## 2015-11-12 NOTE — Progress Notes (Signed)
Pharmacy Antibiotic Note  Casey Pennington is a 60 y.o. male admitted on 11/21/2015 with bacteremia. Pt found down CK >50K, underwent emergent ex lap and bowel resection yesterday. Pt also has MRSA bacteremia and ecoli UTI in the setting of AKI, he will be starting CRRT this afternoon. A vancomycin random level was drawn this morning and was subtherapeutic.  Plan: -Zosyn 2.25 g IV q6h -Vancomycin 1250 mg IV x1 then 750 mg IV q24h -Monitor CRRT, cultures -May have to dose off of vancomycin random levels   Height: _0  (175.3 cm) Weight: 153 lb 14.1 oz (69.8 kg) IBW/kg (Calculated) : 70.7  Temp (24hrs), Avg:99.9 F (37.7 C), Min:97.9 F (36.6 C), Max:100.2 F (37.9 C)   Recent Labs Lab 11/12/2015 1709 11/09/2015 2115 11/19/2015 0515 11/24/2015 0535 11/06/2015 1530 11/12/15 0548 11/12/15 0838  WBC 19.9*  --  12.2*  --  6.5 13.9*  --   CREATININE 3.47*  --  3.86*  --  4.17* 5.48*  --   LATICACIDVEN  --  2.2*  --  2.6*  --   --   --   VANCORANDOM  --   --   --   --   --   --  12    Estimated Creatinine Clearance: 14.5 mL/min (by C-G formula based on Cr of 5.48).    No Known Allergies    Antimicrobials this admission: 5/17 Vanc >> 5/17 Zosyn >> 5/18 Diflucan >>  Dose adjustments this admission: 5/19 VR: 12  Microbiology results:  5/17 BCx: 2/2 mrsa 5/17 UCx: ecoli 5/18 MRSA PCR:      Harvel Quale 11/12/2015 2:20 PM

## 2015-11-12 NOTE — Progress Notes (Signed)
60 YO male with HIV-HCV co infection and hx of polysubstance abuse admitted to Medical ICU found to have AMS, abdominal perforation, and MRSA bacteremia after 2 day binge including opiates, amphetamines, gabapentin, and sildenafil. Patient is currently on full vent support. CSW will continue to follow for disposition and substance abuse consult.          Emiliano Dyer, LCSW [facility] ED/53M Clinical Social Worker [phone]

## 2015-11-12 NOTE — Progress Notes (Signed)
Verbal order by Dr. Halford Chessman to use HD catheter

## 2015-11-12 NOTE — Consult Note (Addendum)
[facility]ious [facility]  Total days of antibiotics 3        Day 3 vanco/piptazo/fluc               Reason for Consult: MRSA bactremia    Referring Physician: feinstein  Principal Problem:   Rhabdomyolysis Active Problems:   HIV disease ([place])   Pulmonary HTN ([place])   Chronic pain   BPH (benign prostatic hyperplasia)   Cirrhosis of liver without ascites (HCC)   Chronic hepatitis C ([place])   AKI (acute kidney injury) ([place])   Drug intoxication ([place])   Polysubstance abuse   Thrombocytopenia ([place])   Relative polycythemia   Pneumatosis intestinalis   Septic shock (HCC)    HPI: Casey Pennington is a 60 y.o. 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 He is on chronic pain meds from [facility]. hx of polysubstance abuse, admitted on 5/17 via EMS when family found him to be minimally responsive 2/2 drug intoxication after 2 day drug binge including opiates, amphetamines, gabapentin, and sildenafil. Upon arrival to the ED, patient is found to be febrile to 38.4 C, HR 120s, Chest x-ray findings are consistent with pulmonary hypertension but with no acute cardiopulmonary disease.CT found to have signs concerns of perforation. He emergently went to the OR for exlap which found ischemic distal ileum with patchy necrosis and perforated meckel's diverticulum. He had small bowel resection with open abdomen vac in place, with thought he may have to return to OR. He was started on abtx for abdominal perforation coverage to include vanco, piptazo plus fluconazole. WBC 14K, plt 38, cr 5.48 due to rhabdo with CK 16,970 down from >50K. Remains to have low grade fever. ID cx found to have MRSA bacteremia in 2/2 sets on admit.  History obtained from chart review   Past Medical History  Diagnosis Date  . Anemia   . Arthritis   . Oxygen deficiency   .  Hypertension   . Cirrhosis of liver ([place])   . Urethral stricture   . BPH (benign prostatic hyperplasia)   . Trigger finger   . Nerve pain   . Pancytopenia ([place])   . Pulmonary hypertension ([place])   . HIV (human immunodeficiency virus infection) ([place])   . Hep C w/o coma, chronic (HCC)     Allergies: No Known Allergies  MEDICATIONS: . antiseptic oral rinse  7 mL Mouth Rinse QID  . chlorhexidine gluconate (SAGE KIT)  15 mL Mouth Rinse BID  . Chlorhexidine Gluconate Cloth  6 each Topical Q0600  . fluconazole (DIFLUCAN) IV  200 mg Intravenous Q0600  . hydrocortisone sod succinate (SOLU-CORTEF) inj  50 mg Intravenous Q6H  . mupirocin ointment  1 application Nasal BID  . pantoprazole (PROTONIX) IV  40 mg Intravenous QHS  . piperacillin-tazobactam (ZOSYN)  IV  2.25 g Intravenous Q8H  . sodium chloride flush  3 mL Intravenous Q12H  . vancomycin  1,000 mg Intravenous Q48H    Social History  Substance Use Topics  . Smoking status: Current Some Day Smoker -- 0.25 packs/day    Types: Cigarettes  . Smokeless tobacco: None  . Alcohol Use: No  - hx of incarceration x 13 yr, released in Dec 2016.   Family History  Problem Relation Age of Onset  . Arthritis Mother   . Arthritis Father   . Cancer Father  Review of Systems - Unable to obtain since he remains intubated/sedated  OBJECTIVE: Temp:  [96.4 F (35.8 C)-100.2 F (37.9 C)] 100 F (37.8 C) (05/19 0800) Pulse Rate:  [103-127] 116 (05/19 0800) Resp:  [20-23] 20 (05/19 0800) BP: (81-146)/(54-104) 114/74 mmHg (05/19 0800) SpO2:  [86 %-100 %] 100 % (05/19 0800) Arterial Line BP: (86-126)/(47-66) 107/59 mmHg (05/19 0800) FiO2 (%):  [60 %-80 %] 60 % (05/19 0742) Weight:  [153 lb 14.1 oz (69.8 kg)] 153 lb 14.1 oz (69.8 kg) (05/19 0215) Physical Exam  Constitutional: intermittently opens eyes. He appears well-developed and well-nourished. No distress.  HENT: intubated,  Mouth/Throat: OETT in place Cardiovascular: Normal rate,  regular rhythm and normal heart sounds. Exam reveals no gallop and no friction rub.  No murmur heard.  Pulmonary/Chest: Effort normal and breath sounds normal. No respiratory distress. He has no wheezes.  Abdominal: Soft. Bowel sounds are normal. He exhibits no distension. Bandage in place Skin: Skin is warm and dry. No rash noted. No erythema.   LABS: Results for orders placed or performed during the hospital encounter of 11/17/2015 (from the past 48 hour(s))  CBG monitoring, ED     Status: None   Collection Time: 11/21/2015  4:55 PM  Result Value Ref Range   Glucose-Capillary 98 65 - 99 mg/dL  CBC     Status: Abnormal   Collection Time: 11/01/2015  5:09 PM  Result Value Ref Range   WBC 19.9 (H) 4.0 - 10.5 K/uL   RBC 5.48 4.22 - 5.81 MIL/uL   Hemoglobin 18.3 (H) 13.0 - 17.0 g/dL   HCT 54.1 (H) 39.0 - 52.0 %   MCV 98.7 78.0 - 100.0 fL   MCH 33.4 26.0 - 34.0 pg   MCHC 33.8 30.0 - 36.0 g/dL   RDW 17.2 (H) 11.5 - 15.5 %   Platelets 122 (L) 150 - 400 K/uL  Basic metabolic panel     Status: Abnormal   Collection Time: 11/18/2015  5:09 PM  Result Value Ref Range   Sodium 142 135 - 145 mmol/L   Potassium 4.7 3.5 - 5.1 mmol/L   Chloride 114 (H) 101 - 111 mmol/L   CO2 12 (L) 22 - 32 mmol/L   Glucose, Bld 100 (H) 65 - 99 mg/dL   BUN 58 (H) 6 - 20 mg/dL   Creatinine, Ser 3.47 (H) 0.61 - 1.24 mg/dL   Calcium 8.5 (L) 8.9 - 10.3 mg/dL   GFR calc non Af Amer 18 (L) >60 mL/min   GFR calc Af Amer 21 (L) >60 mL/min    Comment: (NOTE) The eGFR has been calculated using the CKD EPI equation. This calculation has not been validated in all clinical situations. eGFR's persistently <60 mL/min signify possible Chronic Kidney Disease.    Anion gap 16 (H) 5 - 15  CK     Status: Abnormal   Collection Time: 11/09/2015  5:09 PM  Result Value Ref Range   Total CK >50000 (H) 49 - 397 U/L    Comment: RESULTS CONFIRMED BY MANUAL DILUTION  Ethanol     Status: None   Collection Time: 11/24/2015  5:15 PM  Result  Value Ref Range   Alcohol, Ethyl (B) <5 <5 mg/dL    Comment:        LOWEST DETECTABLE LIMIT FOR SERUM ALCOHOL IS 5 mg/dL FOR MEDICAL PURPOSES ONLY   Acetaminophen level     Status: Abnormal   Collection Time: 11/18/2015  5:15 PM  Result Value Ref Range  Acetaminophen (Tylenol), Serum <10 (L) 10 - 30 ug/mL    Comment:        THERAPEUTIC CONCENTRATIONS VARY SIGNIFICANTLY. A RANGE OF 10-30 ug/mL MAY BE AN EFFECTIVE CONCENTRATION FOR MANY PATIENTS. HOWEVER, SOME ARE BEST TREATED AT CONCENTRATIONS OUTSIDE THIS RANGE. ACETAMINOPHEN CONCENTRATIONS >150 ug/mL AT 4 HOURS AFTER INGESTION AND >50 ug/mL AT 12 HOURS AFTER INGESTION ARE OFTEN ASSOCIATED WITH TOXIC REACTIONS.   Salicylate level     Status: None   Collection Time: 11/16/2015  5:15 PM  Result Value Ref Range   Salicylate Lvl <2.0 2.8 - 30.0 mg/dL  Urine rapid drug screen (hosp performed)     Status: Abnormal   Collection Time: 11/24/2015  5:20 PM  Result Value Ref Range   Opiates NONE DETECTED NONE DETECTED   Cocaine NONE DETECTED NONE DETECTED   Benzodiazepines POSITIVE (A) NONE DETECTED   Amphetamines POSITIVE (A) NONE DETECTED   Tetrahydrocannabinol POSITIVE (A) NONE DETECTED   Barbiturates NONE DETECTED NONE DETECTED    Comment:        DRUG SCREEN FOR MEDICAL PURPOSES ONLY.  IF CONFIRMATION IS NEEDED FOR ANY PURPOSE, NOTIFY LAB WITHIN 5 DAYS.        LOWEST DETECTABLE LIMITS FOR URINE DRUG SCREEN Drug Class       Cutoff (ng/mL) Amphetamine      1000 Barbiturate      200 Benzodiazepine   233 Tricyclics       435 Opiates          300 Cocaine          300 THC              50   Urinalysis, Routine w reflex microscopic (not at [facility])     Status: Abnormal   Collection Time: 10/27/2015  5:20 PM  Result Value Ref Range   Color, Urine BROWN (A) YELLOW    Comment: BIOCHEMICALS MAY BE AFFECTED BY COLOR   APPearance CLEAR CLEAR   Specific Gravity, Urine >1.030 (H) 1.005 - 1.030   pH 6.5 5.0 - 8.0   Glucose, UA NEGATIVE  NEGATIVE mg/dL   Hgb urine dipstick LARGE (A) NEGATIVE   Bilirubin Urine MODERATE (A) NEGATIVE   Ketones, ur 15 (A) NEGATIVE mg/dL   Protein, ur >300 (A) NEGATIVE mg/dL   Nitrite POSITIVE (A) NEGATIVE   Leukocytes, UA TRACE (A) NEGATIVE  Urine microscopic-add on     Status: Abnormal   Collection Time: 11/17/2015  5:20 PM  Result Value Ref Range   Squamous Epithelial / LPF 0-5 (A) NONE SEEN   WBC, UA 0-5 0 - 5 WBC/hpf   RBC / HPF 0-5 0 - 5 RBC/hpf   Bacteria, UA MANY (A) NONE SEEN   Casts GRANULAR CAST (A) NEGATIVE  Urine culture     Status: None (Preliminary result)   Collection Time: 11/18/2015  5:20 PM  Result Value Ref Range   Specimen Description URINE, CATHETERIZED    Special Requests NONE    Culture      CULTURE REINCUBATED FOR BETTER GROWTH Performed at [facility]    Report Status PENDING   Blood culture (routine x 2)     Status: Abnormal (Preliminary result)   Collection Time: 11/02/2015  8:45 PM  Result Value Ref Range   Specimen Description BLOOD RIGHT ARM    Special Requests BOTTLES DRAWN AEROBIC AND ANAEROBIC 6CC    Culture  Setup Time      GRAM POSITIVE COCCI IN CLUSTERS RECOVERED FROM  BOTH BOTTLES. Gram Stain Report Called to,Read Back By and Verified With: HARVEY,T. [facility]) AT 6948 ON 10/27/2015 BY BAUGHAM,M. Performed at [facility] (A)    Report Status PENDING   Procalcitonin     Status: None   Collection Time: 11/20/2015  8:45 PM  Result Value Ref Range   Procalcitonin 1.69 ng/mL    Comment:        Interpretation: PCT > 0.5 ng/mL and <= 2 ng/mL: Systemic infection (sepsis) is possible, but other conditions are known to elevate PCT as well. (NOTE)         ICU PCT Algorithm               Non ICU PCT Algorithm    ----------------------------     ------------------------------         PCT < 0.25 ng/mL                 PCT < 0.1 ng/mL     Stopping of antibiotics            Stopping of antibiotics        strongly encouraged.               strongly encouraged.    ----------------------------     ------------------------------       PCT level decrease by               PCT < 0.25 ng/mL       >= 80% from peak PCT       OR PCT 0.25 - 0.5 ng/mL          Stopping of antibiotics                                             encouraged.     Stopping of antibiotics           encouraged.    ----------------------------     ------------------------------       PCT level decrease by              PCT >= 0.25 ng/mL       < 80% from peak PCT        AND PCT >= 0.5 ng/mL             Continuing antibiotics                                              encouraged.       Continuing antibiotics            encouraged.    ----------------------------     ------------------------------     PCT level increase compared          PCT > 0.5 ng/mL         with peak PCT AND          PCT >= 0.5 ng/mL             Escalation of antibiotics  strongly encouraged.      Escalation of antibiotics        strongly encouraged.   Hepatic function panel     Status: Abnormal   Collection Time: 11/09/2015  8:45 PM  Result Value Ref Range   Total Protein 9.0 (H) 6.5 - 8.1 g/dL   Albumin 4.1 3.5 - 5.0 g/dL   AST 1554 (H) 15 - 41 U/L   ALT 359 (H) 17 - 63 U/L   Alkaline Phosphatase 116 38 - 126 U/L   Total Bilirubin 1.7 (H) 0.3 - 1.2 mg/dL   Bilirubin, Direct 0.4 0.1 - 0.5 mg/dL   Indirect Bilirubin 1.3 (H) 0.3 - 0.9 mg/dL  Troponin I     Status: Abnormal   Collection Time: 10/30/2015  8:45 PM  Result Value Ref Range   Troponin I 8.53 (HH) <0.031 ng/mL    Comment:        POSSIBLE MYOCARDIAL ISCHEMIA. SERIAL TESTING RECOMMENDED. CRITICAL RESULT CALLED TO, READ BACK BY AND VERIFIED WITH: POINDEXTER,M ON 11/17/2015 AT 2210 BY LOY,C   Blood culture (routine x 2)     Status: Abnormal (Preliminary result)   Collection Time: 10/28/2015  9:15 PM  Result Value Ref Range   Specimen Description BLOOD  LEFT ARM    Special Requests BOTTLES DRAWN AEROBIC AND ANAEROBIC 6CC    Culture  Setup Time      GRAM POSITIVE COCCI IN CLUSTERS RECOVERED [place] BOTTLES Gram Stain Report Called to,Read Back By and Verified With: HARVEY,T [facility]) AT 6010 ON 11/02/2015 BY BAUGHAM,M. Performed at [facility] ID to follow CRITICAL RESULT CALLED TO, READ BACK BY AND VERIFIED WITH: R RUMBARGAR PHARMD 1800 11/07/2015 A BROWNING    Culture (A)     STAPHYLOCOCCUS AUREUS SUSCEPTIBILITIES TO FOLLOW Performed at [facility]    Report Status PENDING   Lactic acid, plasma     Status: Abnormal   Collection Time: 11/08/2015  9:15 PM  Result Value Ref Range   Lactic Acid, Venous 2.2 (HH) 0.5 - 2.0 mmol/L    Comment: CRITICAL RESULT CALLED TO, READ BACK BY AND VERIFIED WITH: POINDEXTER,M ON 10/27/2015 AT 2200 BY LOY,C   Blood Culture ID Panel (Reflexed)     Status: Abnormal   Collection Time: 10/30/2015  9:15 PM  Result Value Ref Range   Enterococcus species NOT DETECTED NOT DETECTED   Vancomycin resistance NOT DETECTED NOT DETECTED   Listeria monocytogenes NOT DETECTED NOT DETECTED   Staphylococcus species NOT DETECTED NOT DETECTED   Staphylococcus aureus DETECTED (A) NOT DETECTED    Comment: CRITICAL RESULT CALLED TO, READ BACK BY AND VERIFIED WITH: R RUMBARGAR PHARMD 1800 11/19/2015 A BROWNING    Methicillin resistance DETECTED (A) NOT DETECTED    Comment: CRITICAL RESULT CALLED TO, READ BACK BY AND VERIFIED WITH: R RUMBARGAR PHARMD 1800 11/05/2015 A BROWNING    Streptococcus species NOT DETECTED NOT DETECTED   Streptococcus agalactiae NOT DETECTED NOT DETECTED   Streptococcus pneumoniae NOT DETECTED NOT DETECTED   Streptococcus pyogenes NOT DETECTED NOT DETECTED   Acinetobacter baumannii NOT DETECTED NOT DETECTED   Enterobacteriaceae species NOT DETECTED NOT DETECTED   Enterobacter cloacae complex NOT DETECTED NOT DETECTED   Escherichia coli NOT DETECTED NOT DETECTED   Klebsiella oxytoca  NOT DETECTED NOT DETECTED   Klebsiella pneumoniae NOT DETECTED NOT DETECTED   Proteus species NOT DETECTED NOT DETECTED   Serratia marcescens NOT DETECTED NOT DETECTED   Carbapenem resistance NOT DETECTED NOT DETECTED  Haemophilus influenzae NOT DETECTED NOT DETECTED   Neisseria meningitidis NOT DETECTED NOT DETECTED   Pseudomonas aeruginosa NOT DETECTED NOT DETECTED   Candida albicans NOT DETECTED NOT DETECTED   Candida glabrata NOT DETECTED NOT DETECTED   Candida krusei NOT DETECTED NOT DETECTED   Candida parapsilosis NOT DETECTED NOT DETECTED   Candida tropicalis NOT DETECTED NOT DETECTED    Comment: Performed at [facility]  Blood gas, arterial ([facility] & [facility] ONLY)     Status: Abnormal   Collection Time: 11/24/2015  1:02 AM  Result Value Ref Range   FIO2 100.00    Delivery systems VENTILATOR    Mode PRESSURE REGULATED VOLUME CONTROL    VT 500 mL   LHR 14 resp/min   Peep/cpap 7.0 cm H20   pH, Arterial 7.11 (LL) 7.350 - 7.450    Comment: CRITICAL RESULT CALLED TO, READ BACK BY AND VERIFIED WITH:  MELINDA P. RN BY K KNICK RRT,RCP ON 11/01/2015 AT 01112    pCO2 arterial 43.5 35.0 - 45.0 mmHg   pO2, Arterial 106 (H) 80.0 - 100.0 mmHg   Bicarbonate 12.6 (L) 20.0 - 24.0 mEq/L   TCO2 21.3 0 - 100 mmol/L   Acid-Base Excess 14.4 (H) 0.0 - 2.0 mmol/L   O2 Saturation 94.0 %   Patient temperature 38.4    Collection site RIGHT RADIAL    Drawn by [id number]    Sample type ARTERIAL    Allens test (pass/fail) PASS PASS  POC CBG, ED     Status: None   Collection Time: 11/04/2015  2:35 AM  Result Value Ref Range   Glucose-Capillary 96 65 - 99 mg/dL  Comprehensive metabolic panel     Status: Abnormal   Collection Time: 11/23/2015  5:15 AM  Result Value Ref Range   Sodium 144 135 - 145 mmol/L   Potassium 5.3 (H) 3.5 - 5.1 mmol/L   Chloride 112 (H) 101 - 111 mmol/L   CO2 16 (L) 22 - 32 mmol/L   Glucose, Bld 94 65 - 99 mg/dL   BUN 70 (H) 6 - 20 mg/dL   Creatinine, Ser 3.86 (H) 0.61 - 1.24  mg/dL   Calcium 6.9 (L) 8.9 - 10.3 mg/dL   Total Protein 7.2 6.5 - 8.1 g/dL   Albumin 2.7 (L) 3.5 - 5.0 g/dL   AST 1191 (H) 15 - 41 U/L   ALT 372 (H) 17 - 63 U/L   Alkaline Phosphatase 97 38 - 126 U/L   Total Bilirubin 5.7 (H) 0.3 - 1.2 mg/dL   GFR calc non Af Amer 16 (L) >60 mL/min   GFR calc Af Amer 18 (L) >60 mL/min    Comment: (NOTE) The eGFR has been calculated using the CKD EPI equation. This calculation has not been validated in all clinical situations. eGFR's persistently <60 mL/min signify possible Chronic Kidney Disease.    Anion gap 16 (H) 5 - 15  Magnesium     Status: None   Collection Time: 11/09/2015  5:15 AM  Result Value Ref Range   Magnesium 2.3 1.7 - 2.4 mg/dL  Phosphorus     Status: Abnormal   Collection Time: 10/31/2015  5:15 AM  Result Value Ref Range   Phosphorus 7.7 (H) 2.5 - 4.6 mg/dL  Amylase     Status: Abnormal   Collection Time: 11/07/2015  5:15 AM  Result Value Ref Range   Amylase 122 (H) 28 - 100 U/L  Lipase, blood     Status: None  Collection Time: 11/20/2015  5:15 AM  Result Value Ref Range   Lipase 21 11 - 51 U/L  CBC WITH DIFFERENTIAL     Status: Abnormal   Collection Time: 11/21/2015  5:15 AM  Result Value Ref Range   WBC 12.2 (H) 4.0 - 10.5 K/uL   RBC 4.64 4.22 - 5.81 MIL/uL   Hemoglobin 15.5 13.0 - 17.0 g/dL   HCT 47.1 39.0 - 52.0 %   MCV 101.5 (H) 78.0 - 100.0 fL   MCH 33.4 26.0 - 34.0 pg   MCHC 32.9 30.0 - 36.0 g/dL   RDW 17.1 (H) 11.5 - 15.5 %   Platelets 88 (L) 150 - 400 K/uL    Comment: SPECIMEN CHECKED FOR CLOTS REPEATED TO VERIFY PLATELET COUNT CONFIRMED BY SMEAR    Neutrophils Relative % 89 %   Lymphocytes Relative 6 %   Monocytes Relative 5 %   Eosinophils Relative 0 %   Basophils Relative 0 %   Neutro Abs 10.9 (H) 1.7 - 7.7 K/uL   Lymphs Abs 0.7 0.7 - 4.0 K/uL   Monocytes Absolute 0.6 0.1 - 1.0 K/uL   Eosinophils Absolute 0.0 0.0 - 0.7 K/uL   Basophils Absolute 0.0 0.0 - 0.1 K/uL   RBC Morphology TEARDROP CELLS    WBC  Morphology INCREASED BANDS (>20% BANDS)     Comment: MILD LEFT SHIFT (1-5% METAS, OCC MYELO, OCC BANDS) TOXIC GRANULATION    Smear Review LARGE PLATELETS PRESENT   Cortisol     Status: None   Collection Time: 11/12/2015  5:15 AM  Result Value Ref Range   Cortisol, Plasma 58.2 ug/dL    Comment: (NOTE) AM    6.7 - 22.6 ug/dL PM   <10.0       ug/dL   Protime-INR     Status: Abnormal   Collection Time: 11/21/2015  5:15 AM  Result Value Ref Range   Prothrombin Time 24.4 (H) 11.6 - 15.2 seconds   INR 2.22 (H) 0.00 - 1.49  Blood gas, arterial     Status: Abnormal   Collection Time: 11/04/2015  5:25 AM  Result Value Ref Range   FIO2 1.00    Delivery systems VENTILATOR    Mode PRESSURE REGULATED VOLUME CONTROL    VT 500 mL   LHR 14 resp/min   Peep/cpap 5.0 cm H20   pH, Arterial 7.222 (L) 7.350 - 7.450   pCO2 arterial 35.1 35.0 - 45.0 mmHg   pO2, Arterial 245 (H) 80.0 - 100.0 mmHg   Bicarbonate 13.9 (L) 20.0 - 24.0 mEq/L   TCO2 15.0 0 - 100 mmol/L   Acid-base deficit 12.3 (H) 0.0 - 2.0 mmol/L   O2 Saturation 99.0 %   Patient temperature 98.6    Collection site RIGHT RADIAL    Drawn by [phone]    Sample type ARTERIAL    Allens test (pass/fail) PASS PASS  Carboxyhemoglobin     Status: None   Collection Time: 10/27/2015  5:25 AM  Result Value Ref Range   Total hemoglobin 15.8 13.5 - 18.0 g/dL   O2 Saturation 80.3 %   Carboxyhemoglobin 1.1 0.5 - 1.5 %   Methemoglobin 0.9 0.0 - 1.5 %  Lactic acid, plasma     Status: Abnormal   Collection Time: 10/31/2015  5:35 AM  Result Value Ref Range   Lactic Acid, Venous 2.6 (HH) 0.5 - 2.0 mmol/L    Comment: CRITICAL RESULT CALLED TO, READ BACK BY AND VERIFIED WITH: DALLAS,T RN 11/24/2015 0617 JORDANS  MRSA PCR Screening     Status: Abnormal   Collection Time: 10/26/2015  6:08 AM  Result Value Ref Range   MRSA by PCR POSITIVE (A) NEGATIVE    Comment:        The GeneXpert MRSA Assay (FDA approved for NASAL specimens only), is one component of  a comprehensive MRSA colonization surveillance program. It is not intended to diagnose MRSA infection nor to guide or monitor treatment for MRSA infections. RESULT CALLED TO, READ BACK BY AND VERIFIED WITH: Carma Leaven AT 9371 11/14/2015 BY K BARR   Prepare fresh frozen plasma     Status: None   Collection Time: 11/21/2015  6:21 AM  Result Value Ref Range   Unit Number [id number]    Blood Component Type THAWED PLASMA    Unit division 00    Status of Unit ISSUED,FINAL    Transfusion Status OK TO TRANSFUSE    Unit Number [id number]    Blood Component Type THWPLS APHR1    Unit division 00    Status of Unit ISSUED,FINAL    Transfusion Status OK TO TRANSFUSE    Unit Number [id number]    Blood Component Type THAWED PLASMA    Unit division 00    Status of Unit ISSUED,FINAL    Transfusion Status OK TO TRANSFUSE    Unit Number [id number]    Blood Component Type THAWED PLASMA    Unit division 00    Status of Unit ISSUED,FINAL    Transfusion Status OK TO TRANSFUSE   Type and screen [place]     Status: None   Collection Time: 11/09/2015  7:00 AM  Result Value Ref Range   ABO/RH(D) O POS    Antibody Screen NEG    Sample Expiration 11/14/2015   ABO/Rh     Status: None   Collection Time: 11/12/2015  7:00 AM  Result Value Ref Range   ABO/RH(D) O POS   Glucose, capillary     Status: None   Collection Time: 11/17/2015  8:31 AM  Result Value Ref Range   Glucose-Capillary 90 65 - 99 mg/dL  Troponin I     Status: Abnormal   Collection Time: 10/25/2015  9:15 AM  Result Value Ref Range   Troponin I 25.27 (HH) <0.031 ng/mL    Comment:        POSSIBLE MYOCARDIAL ISCHEMIA. SERIAL TESTING RECOMMENDED. CRITICAL RESULT CALLED TO, READ BACK BY AND VERIFIED WITH: T.WAGONER,RN 11/12/2015 1054 BY BSLADE   I-STAT 3, arterial blood gas (G3+)     Status: Abnormal   Collection Time: 11/12/2015 12:20 PM  Result Value Ref Range   pH, Arterial 7.305 (L) 7.350 - 7.450   pCO2  arterial 40.1 35.0 - 45.0 mmHg   pO2, Arterial 144.0 (H) 80.0 - 100.0 mmHg   Bicarbonate 20.3 20.0 - 24.0 mEq/L   TCO2 22 0 - 100 mmol/L   O2 Saturation 99.0 %   Acid-base deficit 6.0 (H) 0.0 - 2.0 mmol/L   Patient temperature 35.7 C    Collection site ARTERIAL LINE    Drawn by Operator    Sample type ARTERIAL   Basic metabolic panel     Status: Abnormal   Collection Time: 10/29/2015  3:30 PM  Result Value Ref Range   Sodium 144 135 - 145 mmol/L   Potassium 4.5 3.5 - 5.1 mmol/L   Chloride 108 101 - 111 mmol/L   CO2 20 (L) 22 - 32 mmol/L   Glucose, Bld 124 (H)  65 - 99 mg/dL   BUN 83 (H) 6 - 20 mg/dL   Creatinine, Ser 4.17 (H) 0.61 - 1.24 mg/dL   Calcium 6.9 (L) 8.9 - 10.3 mg/dL   GFR calc non Af Amer 14 (L) >60 mL/min   GFR calc Af Amer 17 (L) >60 mL/min    Comment: (NOTE) The eGFR has been calculated using the CKD EPI equation. This calculation has not been validated in all clinical situations. eGFR's persistently <60 mL/min signify possible Chronic Kidney Disease.    Anion gap 16 (H) 5 - 15  CBC     Status: Abnormal   Collection Time: 10/28/2015  3:30 PM  Result Value Ref Range   WBC 6.5 4.0 - 10.5 K/uL   RBC 3.83 (L) 4.22 - 5.81 MIL/uL   Hemoglobin 12.1 (L) 13.0 - 17.0 g/dL    Comment: DELTA CHECK NOTED RESULT CALLED TO, READ BACK BY AND VERIFIED WITH: M [facility] AT 1557 ON 5.18.17 BY W JOHNSON    HCT 38.7 (L) 39.0 - 52.0 %   MCV 101.0 (H) 78.0 - 100.0 fL   MCH 31.6 26.0 - 34.0 pg   MCHC 31.3 30.0 - 36.0 g/dL   RDW 16.9 (H) 11.5 - 15.5 %   Platelets 38 (L) 150 - 400 K/uL  Troponin I     Status: Abnormal   Collection Time: 11/23/2015  3:30 PM  Result Value Ref Range   Troponin I 27.18 (HH) <0.031 ng/mL    Comment:        POSSIBLE MYOCARDIAL ISCHEMIA. SERIAL TESTING RECOMMENDED. CRITICAL VALUE NOTED.  VALUE IS CONSISTENT WITH PREVIOUSLY REPORTED AND CALLED VALUE.   Glucose, capillary     Status: None   Collection Time: 11/04/2015  7:53 PM  Result Value Ref Range    Glucose-Capillary 98 65 - 99 mg/dL  I-STAT 3, arterial blood gas (G3+)     Status: Abnormal   Collection Time: 11/03/2015  7:53 PM  Result Value Ref Range   pH, Arterial 7.310 (L) 7.350 - 7.450   pCO2 arterial 49.7 (H) 35.0 - 45.0 mmHg   pO2, Arterial 94.0 80.0 - 100.0 mmHg   Bicarbonate 24.8 (H) 20.0 - 24.0 mEq/L   TCO2 26 0 - 100 mmol/L   O2 Saturation 96.0 %   Acid-base deficit 2.0 0.0 - 2.0 mmol/L   Patient temperature 100.0 F    Collection site ARTERIAL LINE    Drawn by Operator    Sample type ARTERIAL   Heparin level (unfractionated)     Status: Abnormal   Collection Time: 11/12/15  5:00 AM  Result Value Ref Range   Heparin Unfractionated <0.10 (L) 0.30 - 0.70 IU/mL    Comment:        IF HEPARIN RESULTS ARE BELOW EXPECTED VALUES, AND PATIENT DOSAGE HAS BEEN CONFIRMED, SUGGEST FOLLOW UP TESTING OF ANTITHROMBIN III LEVELS.   CBC     Status: Abnormal   Collection Time: 11/12/15  5:48 AM  Result Value Ref Range   WBC 13.9 (H) 4.0 - 10.5 K/uL   RBC 3.77 (L) 4.22 - 5.81 MIL/uL   Hemoglobin 12.3 (L) 13.0 - 17.0 g/dL   HCT 37.2 (L) 39.0 - 52.0 %   MCV 98.7 78.0 - 100.0 fL   MCH 32.6 26.0 - 34.0 pg   MCHC 33.1 30.0 - 36.0 g/dL   RDW 16.8 (H) 11.5 - 15.5 %   Platelets 38 (L) 150 - 400 K/uL    Comment: CONSISTENT WITH PREVIOUS RESULT  Basic metabolic panel     Status: Abnormal   Collection Time: 11/12/15  5:48 AM  Result Value Ref Range   Sodium 145 135 - 145 mmol/L   Potassium 4.8 3.5 - 5.1 mmol/L   Chloride 101 101 - 111 mmol/L   CO2 24 22 - 32 mmol/L   Glucose, Bld 109 (H) 65 - 99 mg/dL   BUN 105 (H) 6 - 20 mg/dL   Creatinine, Ser 5.48 (H) 0.61 - 1.24 mg/dL   Calcium 6.2 (LL) 8.9 - 10.3 mg/dL    Comment: CRITICAL RESULT CALLED TO, READ BACK BY AND VERIFIED WITH: L.ROBBINS,RN 0731 11/12/15 CLARK,S    GFR calc non Af Amer 10 (L) >60 mL/min   GFR calc Af Amer 12 (L) >60 mL/min    Comment: (NOTE) The eGFR has been calculated using the CKD EPI equation. This calculation  has not been validated in all clinical situations. eGFR's persistently <60 mL/min signify possible Chronic Kidney Disease.    Anion gap 20 (H) 5 - 15  CK     Status: Abnormal   Collection Time: 11/12/15  5:48 AM  Result Value Ref Range   Total CK 16970 (H) 49 - 397 U/L    Comment: RESULTS CONFIRMED BY MANUAL DILUTION    MICRO:  IMAGING: Ct Abdomen Pelvis Wo Contrast  11/17/2015  CLINICAL DATA:  60 year old male with 11 mm dated 07/02/2010. EXAM: CT ABDOMEN AND PELVIS WITHOUT CONTRAST TECHNIQUE: Multidetector CT imaging of the abdomen and pelvis was performed following the standard protocol without IV contrast. COMPARISON:  None. FINDINGS: Evaluation of this exam is limited in the absence of intravenous contrast. Evaluation is also limited due to respiratory motion artifact. The visualized lung bases are clear. There is no intra-abdominal free air. No free fluid identified. Large amount of linear and branching air noted in the left lobe of the liver compatible with portal venous gas. The liver is otherwise unremarkable. The gallbladder is mildly distended. No calcified stone or pericholecystic fluid identified. The pancreas, spleen, adrenal glands, kidneys, visualized ureters appear unremarkable. The urinary bladder is collapsed. The prostate and seminal vesicles are grossly unremarkable. Multiple top-normal caliber loops of small bowel noted throughout the abdomen with air-fluid levels, likely a degree of ileus. There is air within the wall of the distal small bowel loop within the pelvis compatible with pneumatosis. The appendix is unremarkable. There is moderate aortoiliac atherosclerotic disease. There is gas within the central SMV extending into the main portal vein. There is no adenopathy. The abdominal wall soft tissues appear unremarkable. There is osteopenia with degenerative changes of the spine. Right femoral fixation hardware as well as evidence of prior left femoral intra medullary rod  and screw noted. No acute fracture. IMPRESSION: Pneumatosis of distal small bowel loops with portal venous gas is highly concerning for ischemic bowel. Clinical correlation and surgical consult is advised. No pneumoperitoneum identified at this time. These results were called by telephone at the time of interpretation on 11/18/2015 at 9:48 pm to Dr. Lajean Saver , who verbally acknowledged these results. Electronically Signed   By: Anner Crete M.D.   On: 11/06/2015 21:52   Ct Head Wo Contrast  10/28/2015  CLINICAL DATA:  Altered mental status; drug overdose EXAM: CT HEAD WITHOUT CONTRAST TECHNIQUE: Contiguous axial images were obtained from the base of the skull through the vertex without intravenous contrast. COMPARISON:  None. FINDINGS: The ventricles are normal in size and configuration. There is no intracranial mass, hemorrhage, extra-axial fluid collection,  or midline shift. Gray-white compartments appear within normal limits. No acute infarct evident bony calvarium appears intact. The visualized mastoid air cells are clear. There is debris in the right external auditory canal. Visualized orbits appear symmetric bilaterally. IMPRESSION: Probable cerumen in the right external auditory canal. No intracranial mass, hemorrhage, or focal gray -white compartment lesions/acute appearing infarct. Electronically Signed   By: Lowella Grip III M.D.   On: 11/06/2015 19:04   Dg Chest Port 1 View  10/30/2015  CLINICAL DATA:  Central line placement.  Postop chest. EXAM: PORTABLE CHEST 1 VIEW COMPARISON:  Chest x-ray from earlier same day. FINDINGS: Endotracheal tube is adequately positioned with tip approximately 3.5 cm above the carina. Left IJ central line is stable in position with tip overlying the mid SVC. New left subclavian central line now in place with tip overlying the m upper id SVC. Nasogastric tube passes below the diaphragm. Again noted are central pulmonary arteries bilaterally suggesting chronic  pulmonary artery hypertension. Heart size is upper normal, stable. Overall cardiomediastinal silhouette is stable in size and configuration. Probable mild atelectasis at the right lung base. Lungs otherwise clear. No pleural effusion. No evidence of volume overload. No pneumothorax seen. IMPRESSION: 1. New left subclavian central line in place with tip adequately positioned at the level of the upper SVC. 2. Previous left IJ central line stable in position with tip at the level of the mid SVC. 3. Probable mild atelectasis at the right lung base. Lungs otherwise clear. No pneumothorax seen. Electronically Signed   By: Franki Cabot M.D.   On: 11/14/2015 13:57   Dg Chest Port 1 View  11/07/2015  CLINICAL DATA:  Central line placement EXAM: PORTABLE CHEST 1 VIEW COMPARISON:  10/28/2015 FINDINGS: Endotracheal tube was placed with tip measuring 5.6 cm above the carina. Enteric tube placed with tip off the field of view but below the left hemidiaphragm. Left central venous catheter placed with tip over the low SVC region. No pneumothorax. Shallow inspiration. Normal heart size. Prominent central pulmonary arteries suggesting pulmonary arterial hypertension. No focal airspace disease or consolidation in the lungs. No blunting of costophrenic angles. IMPRESSION: Appliances appear in satisfactory position. Enlarged central pulmonary arteries suggesting pulmonary arterial hypertension. Electronically Signed   By: Lucienne Capers M.D.   On: 10/25/2015 05:16   Dg Chest Portable 1 View  11/23/2015  CLINICAL DATA:  Post intubation, drug overdose, hypertension, HIV, smoker, cirrhosis, hepatitis-C, pulmonary hypertension EXAM: PORTABLE CHEST 1 VIEW COMPARISON:  11/01/2015 FINDINGS: Tip of endotracheal tube projects 4.3 cm above carina. Normal heart size and mediastinal contours. Enlarged central pulmonary arteries consistent with pulmonary arterial hypertension. RIGHT basilar atelectasis. Lungs otherwise clear. No pleural  effusion or pneumothorax. Bony excrescence at LEFT first costochondral junction unchanged. No acute osseous findings. IMPRESSION: Significantly enlarged central pulmonary arteries consistent with pulmonary arterial hypertension. Minimal RIGHT basilar atelectasis. Electronically Signed   By: Lavonia Dana M.D.   On: 11/18/2015 00:36   Dg Chest Port 1 View  11/12/2015  CLINICAL DATA:  60 year old male with altered mental status EXAM: PORTABLE CHEST 1 VIEW COMPARISON:  None. FINDINGS: Single portable of the chest does not demonstrate focal consolidation. There is no pleural effusion. A linear lucency along the periphery of the right upper lobe at the mid clavicular region may be skin fold artifact. A small pneumothorax is not excluded. Repeat radiograph may provide better evaluation. Top-normal cardiac size. There is mild prominence of the hilar vasculature without evidence of pulmonary edema or congestion. Old healed  left clavicular fracture. No acute osseous pathology identified. IMPRESSION: Skin fold artifact versus a small right upper lobe pneumothorax. Repeat radiograph recommended for further evaluation. Electronically Signed   By: Anner Crete M.D.   On: 11/06/2015 18:17   Dg Abd Acute W/chest  11/17/2015  CLINICAL DATA:  Shortness of Breath. Abdominal pain with constipation EXAM: DG ABDOMEN ACUTE W/ 1V CHEST COMPARISON:  Chest radiograph obtained earlier in the day FINDINGS: PA chest: There is no edema or consolidation. There is borderline cardiomegaly. There is pulmonary arterial hypertension with prominence of the central pulmonary arteries and rapid peripheral tapering bilaterally. No adenopathy evident. Supine and upright abdomen: There is moderate colonic dilatation. There is no appreciable small bowel dilatation. No air-fluid levels. No free air evident. Liver and spleen appear enlarged. There are calcifications in the mid abdomen. IMPRESSION: Suspect a degree of colonic ileus. Findings do not  appear consistent with obstruction. No free air. Evidence of hepatic megaly and splenomegaly. Calcifications in the mid abdomen probably represent calcified mesenteric lymph nodes. Evidence of pulmonary arterial hypertension. Borderline cardiomegaly. No parenchymal lung edema or consolidation. Electronically Signed   By: Lowella Grip III M.D.   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 Continue to monitor  MRSA bacteremia = currently on vancomycin, that is renally dosed. Unable to do linezolid due to thrombocytopenia, nor dapto due to rhabdo.  hiv disease = previously reported to be on tivicay, abacavir, lamivudine, months ago. Currently held due to lack of oral access/currently on hold due to abd perforatin. Does not need oi proph presently.   hcv = appears to have evidence of cirrhosis per OR report  Prognosis =guarded

## 2015-11-13 ENCOUNTER — Encounter (HOSPITAL_COMMUNITY): Admission: EM | Disposition: E | Payer: Self-pay | Source: Home / Self Care | Attending: Internal Medicine

## 2015-11-13 ENCOUNTER — Inpatient Hospital Stay (HOSPITAL_COMMUNITY): Payer: Medicaid Other

## 2015-11-13 ENCOUNTER — Inpatient Hospital Stay (HOSPITAL_COMMUNITY): Payer: Medicaid Other | Admitting: Certified Registered Nurse Anesthetist

## 2015-11-13 ENCOUNTER — Encounter (HOSPITAL_COMMUNITY): Payer: Self-pay | Admitting: Certified Registered Nurse Anesthetist

## 2015-11-13 DIAGNOSIS — K7460 Unspecified cirrhosis of liver: Secondary | ICD-10-CM

## 2015-11-13 DIAGNOSIS — A4102 Sepsis due to Methicillin resistant Staphylococcus aureus: Principal | ICD-10-CM

## 2015-11-13 DIAGNOSIS — K559 Vascular disorder of intestine, unspecified: Secondary | ICD-10-CM

## 2015-11-13 DIAGNOSIS — Z21 Asymptomatic human immunodeficiency virus [HIV] infection status: Secondary | ICD-10-CM

## 2015-11-13 DIAGNOSIS — B1920 Unspecified viral hepatitis C without hepatic coma: Secondary | ICD-10-CM

## 2015-11-13 HISTORY — PX: LAPAROTOMY: SHX154

## 2015-11-13 LAB — RENAL FUNCTION PANEL
Albumin: 2.1 g/dL — ABNORMAL LOW (ref 3.5–5.0)
Anion gap: 10 (ref 5–15)
BUN: 54 mg/dL — ABNORMAL HIGH (ref 6–20)
CO2: 25 mmol/L (ref 22–32)
Calcium: 6.9 mg/dL — ABNORMAL LOW (ref 8.9–10.3)
Chloride: 105 mmol/L (ref 101–111)
Creatinine, Ser: 2.81 mg/dL — ABNORMAL HIGH (ref 0.61–1.24)
GFR calc Af Amer: 27 mL/min — ABNORMAL LOW
GFR calc non Af Amer: 23 mL/min — ABNORMAL LOW
Glucose, Bld: 115 mg/dL — ABNORMAL HIGH (ref 65–99)
Phosphorus: 4.3 mg/dL (ref 2.5–4.6)
Potassium: 4.6 mmol/L (ref 3.5–5.1)
Sodium: 140 mmol/L (ref 135–145)

## 2015-11-13 LAB — BASIC METABOLIC PANEL
ANION GAP: 12 (ref 5–15)
BUN: 73 mg/dL — ABNORMAL HIGH (ref 6–20)
CALCIUM: 6.6 mg/dL — AB (ref 8.9–10.3)
CO2: 26 mmol/L (ref 22–32)
Chloride: 104 mmol/L (ref 101–111)
Creatinine, Ser: 3.64 mg/dL — ABNORMAL HIGH (ref 0.61–1.24)
GFR, EST AFRICAN AMERICAN: 20 mL/min — AB (ref >60)
GFR, EST NON AFRICAN AMERICAN: 17 mL/min — AB (ref >60)
GLUCOSE: 122 mg/dL — AB (ref 65–99)
POTASSIUM: 4.2 mmol/L (ref 3.5–5.1)
SODIUM: 142 mmol/L (ref 135–145)

## 2015-11-13 LAB — CK
Total CK: 6624 U/L — ABNORMAL HIGH (ref 49–397)
Total CK: 3780 U/L — ABNORMAL HIGH (ref 49–397)

## 2015-11-13 LAB — POCT I-STAT 3, ART BLOOD GAS (G3+)
Acid-Base Excess: 1 mmol/L (ref 0.0–2.0)
Bicarbonate: 25.9 meq/L — ABNORMAL HIGH (ref 20.0–24.0)
O2 Saturation: 98 %
Patient temperature: 95.4
TCO2: 27 mmol/L (ref 0–100)
pCO2 arterial: 37 mmHg (ref 35.0–45.0)
pH, Arterial: 7.445 (ref 7.350–7.450)
pO2, Arterial: 94 mmHg (ref 80.0–100.0)

## 2015-11-13 LAB — CBC WITH DIFFERENTIAL/PLATELET
BASOS ABS: 0 10*3/uL (ref 0.0–0.1)
Basophils Relative: 0 %
EOS ABS: 0 10*3/uL (ref 0.0–0.7)
Eosinophils Relative: 0 %
HCT: 31.6 % — ABNORMAL LOW (ref 39.0–52.0)
Hemoglobin: 10.1 g/dL — ABNORMAL LOW (ref 13.0–17.0)
LYMPHS ABS: 0.4 10*3/uL — AB (ref 0.7–4.0)
Lymphocytes Relative: 6 %
MCH: 31.8 pg (ref 26.0–34.0)
MCHC: 32 g/dL (ref 30.0–36.0)
MCV: 99.4 fL (ref 78.0–100.0)
MONO ABS: 0.4 10*3/uL (ref 0.1–1.0)
Monocytes Relative: 6 %
NEUTROS PCT: 88 %
Neutro Abs: 6.1 10*3/uL (ref 1.7–7.7)
PLATELETS: 25 10*3/uL — AB (ref 150–400)
RBC: 3.18 MIL/uL — AB (ref 4.22–5.81)
RDW: 16.6 % — AB (ref 11.5–15.5)
WBC: 6.9 10*3/uL (ref 4.0–10.5)

## 2015-11-13 LAB — CULTURE, BLOOD (ROUTINE X 2)

## 2015-11-13 LAB — GLUCOSE, CAPILLARY
GLUCOSE-CAPILLARY: 106 mg/dL — AB (ref 65–99)
Glucose-Capillary: 108 mg/dL — ABNORMAL HIGH (ref 65–99)
Glucose-Capillary: 109 mg/dL — ABNORMAL HIGH (ref 65–99)
Glucose-Capillary: 110 mg/dL — ABNORMAL HIGH (ref 65–99)
Glucose-Capillary: 123 mg/dL — ABNORMAL HIGH (ref 65–99)
Glucose-Capillary: 98 mg/dL (ref 65–99)

## 2015-11-13 LAB — URINE CULTURE

## 2015-11-13 LAB — MAGNESIUM: MAGNESIUM: 2.1 mg/dL (ref 1.7–2.4)

## 2015-11-13 LAB — PHOSPHORUS: PHOSPHORUS: 3.8 mg/dL (ref 2.5–4.6)

## 2015-11-13 LAB — ALBUMIN: ALBUMIN: 2.1 g/dL — AB (ref 3.5–5.0)

## 2015-11-13 LAB — HEPARIN LEVEL (UNFRACTIONATED)

## 2015-11-13 SURGERY — LAPAROTOMY, EXPLORATORY
Anesthesia: General | Site: Abdomen

## 2015-11-13 MED ORDER — PROPOFOL 10 MG/ML IV BOLUS
INTRAVENOUS | Status: AC
  Filled 2015-11-13: qty 20

## 2015-11-13 MED ORDER — 0.9 % SODIUM CHLORIDE (POUR BTL) OPTIME
TOPICAL | Status: DC | PRN
Start: 2015-11-13 — End: 2015-11-13
  Administered 2015-11-13: 1000 mL
  Administered 2015-11-13: 3000 mL

## 2015-11-13 MED ORDER — ROCURONIUM BROMIDE 100 MG/10ML IV SOLN
INTRAVENOUS | Status: DC | PRN
  Administered 2015-11-13 (×2): 25 mg via INTRAVENOUS

## 2015-11-13 MED ORDER — SODIUM CHLORIDE 0.9% FLUSH
10.0000 mL | Freq: Two times a day (BID) | INTRAVENOUS | Status: DC
  Administered 2015-11-14: 20 mL via INTRAVENOUS
  Administered 2015-11-14 – 2015-11-16 (×3): 10 mL via INTRAVENOUS
  Administered 2015-11-17: 20 mL via INTRAVENOUS
  Administered 2015-11-18 (×2): 10 mL via INTRAVENOUS
  Administered 2015-11-19: 20 mL via INTRAVENOUS
  Administered 2015-11-20 (×2): 10 mL via INTRAVENOUS
  Administered 2015-11-21: 20 mL via INTRAVENOUS

## 2015-11-13 MED ORDER — MIDAZOLAM HCL 5 MG/5ML IJ SOLN
INTRAMUSCULAR | Status: DC | PRN
  Administered 2015-11-13 (×2): 1 mg via INTRAVENOUS

## 2015-11-13 MED ORDER — FENTANYL CITRATE (PF) 100 MCG/2ML IJ SOLN
INTRAMUSCULAR | Status: DC | PRN
  Administered 2015-11-13: 50 ug via INTRAVENOUS
  Administered 2015-11-13 (×2): 100 ug via INTRAVENOUS

## 2015-11-13 MED ORDER — MIDAZOLAM HCL 2 MG/2ML IJ SOLN
INTRAMUSCULAR | Status: AC
  Filled 2015-11-13: qty 2

## 2015-11-13 MED ORDER — SODIUM CHLORIDE 0.9 % IV SOLN
Freq: Once | INTRAVENOUS | Status: AC
  Administered 2015-11-13: 08:00:00 via INTRAVENOUS

## 2015-11-13 MED ORDER — FENTANYL CITRATE (PF) 250 MCG/5ML IJ SOLN
INTRAMUSCULAR | Status: AC
  Filled 2015-11-13: qty 5

## 2015-11-13 SURGICAL SUPPLY — 44 items
APL SKNCLS STERI-STRIP NONHPOA (GAUZE/BANDAGES/DRESSINGS) ×1
BENZOIN TINCTURE PRP APPL 2/3 (GAUZE/BANDAGES/DRESSINGS) ×2 IMPLANT
CANISTER WOUND CARE 500ML ATS (WOUND CARE) ×2 IMPLANT
CHLORAPREP W/TINT 26ML (MISCELLANEOUS) ×1 IMPLANT
CLIP TI LARGE 6 (CLIP) ×3 IMPLANT
CLIP TI MEDIUM 6 (CLIP) ×3 IMPLANT
CLIP TI WIDE RED SMALL 6 (CLIP) ×3 IMPLANT
COVER SURGICAL LIGHT HANDLE (MISCELLANEOUS) ×3 IMPLANT
DRAPE LAPAROSCOPIC ABDOMINAL (DRAPES) ×3 IMPLANT
DRAPE WARM FLUID 44X44 (DRAPE) ×3 IMPLANT
DRSG OPSITE POSTOP 4X10 (GAUZE/BANDAGES/DRESSINGS) IMPLANT
DRSG OPSITE POSTOP 4X8 (GAUZE/BANDAGES/DRESSINGS) IMPLANT
DRSG VAC ATS LRG SENSATRAC (GAUZE/BANDAGES/DRESSINGS) ×2 IMPLANT
ELECT BLADE 6.5 EXT (BLADE) ×2 IMPLANT
ELECT CAUTERY BLADE 6.4 (BLADE) ×3 IMPLANT
ELECT REM PT RETURN 9FT ADLT (ELECTROSURGICAL) ×3
ELECTRODE REM PT RTRN 9FT ADLT (ELECTROSURGICAL) ×1 IMPLANT
GLOVE BIOGEL PI IND STRL 7.0 (GLOVE) ×1 IMPLANT
GLOVE BIOGEL PI INDICATOR 7.0 (GLOVE) ×4
GLOVE SURG SS PI 7.0 STRL IVOR (GLOVE) ×3 IMPLANT
GOWN STRL REUS W/ TWL LRG LVL3 (GOWN DISPOSABLE) ×2 IMPLANT
GOWN STRL REUS W/TWL LRG LVL3 (GOWN DISPOSABLE) ×9
KIT BASIN OR (CUSTOM PROCEDURE TRAY) ×3 IMPLANT
LIGASURE IMPACT 36 18CM CVD LR (INSTRUMENTS) ×2 IMPLANT
LOOP VESSEL MAXI BLUE (MISCELLANEOUS) IMPLANT
LOOP VESSEL MINI RED (MISCELLANEOUS) IMPLANT
PACK GENERAL/GYN (CUSTOM PROCEDURE TRAY) ×3 IMPLANT
RELOAD PROXIMATE 75MM BLUE (ENDOMECHANICALS) ×3 IMPLANT
RELOAD STAPLE 75 3.8 BLU REG (ENDOMECHANICALS) IMPLANT
SPONGE ABDOMINAL VAC ABTHERA (MISCELLANEOUS) ×2 IMPLANT
SPONGE LAP 18X18 X RAY DECT (DISPOSABLE) ×4 IMPLANT
STAPLER PROXIMATE 75MM BLUE (STAPLE) ×2 IMPLANT
STAPLER VISISTAT 35W (STAPLE) ×3 IMPLANT
SUCTION POOLE TIP (SUCTIONS) ×3 IMPLANT
SUT PDS AB 1 TP1 96 (SUTURE) IMPLANT
SUT SILK 2 0 (SUTURE) ×3
SUT SILK 2 0 SH CR/8 (SUTURE) ×3 IMPLANT
SUT SILK 2-0 18XBRD TIE 12 (SUTURE) ×1 IMPLANT
SUT SILK 3 0 (SUTURE) ×3
SUT SILK 3 0 SH CR/8 (SUTURE) ×3 IMPLANT
SUT SILK 3-0 18XBRD TIE 12 (SUTURE) ×1 IMPLANT
TOWEL OR 17X26 10 PK STRL BLUE (TOWEL DISPOSABLE) ×5 IMPLANT
TRAY FOLEY CATH 16FRSI W/METER (SET/KITS/TRAYS/PACK) ×1 IMPLANT
YANKAUER SUCT BULB TIP NO VENT (SUCTIONS) ×2 IMPLANT

## 2015-11-13 NOTE — Progress Notes (Signed)
[facility] for Infectious Disease   Reason for visit: Follow up on sepsis, HIV  Interval History: s/p surgery with small bowel resection, negative pressure dressing.  Previous ischemic bowel with ex lap and small bowel resection on 5/18.  Currently intubated, sedated.  Family at bedside.    CXR independently reviewed and bibasilar opacities noted, suspect atelectasis.   Physical Exam: Constitutional:  Filed Vitals:   10/29/2015 1000 11/07/2015 1015  BP: 94/69   Pulse: 89 87  Temp:    Resp: 20 20   patient appears in NAD Eyes: anicteric HENT: ET in place; left IJ, right dialysis cath Respiratory: Normal respiratory effort; CTA B, anterior exam Cardiovascular: tachy RR GI: post surgical  Review of Systems: Unable to be assessed due to mental status  Lab Results  Component Value Date   WBC 6.9 11/24/2015   HGB 10.1* 11/23/2015   HCT 31.6* 10/31/2015   MCV 99.4 10/25/2015   PLT 25* 11/04/2015    Lab Results  Component Value Date   CREATININE 3.64* 11/02/2015   BUN 73* 10/27/2015   NA 142 11/06/2015   K 4.2 11/01/2015   CL 104 11/05/2015   CO2 26 11/18/2015    Lab Results  Component Value Date   ALT 418* 11/12/2015   AST 725* 11/12/2015   ALKPHOS 69 11/12/2015     Microbiology: Recent Results (from the past 240 hour(s))  Urine culture     Status: Abnormal   Collection Time: 11/04/2015  5:20 PM  Result Value Ref Range Status   Specimen Description URINE, CATHETERIZED  Final   Special Requests NONE  Final   Culture >=100,000 COLONIES/mL ESCHERICHIA COLI (A)  Final   Report Status 11/07/2015 FINAL  Final   Organism ID, Bacteria ESCHERICHIA COLI (A)  Final      Susceptibility   Escherichia coli - MIC*    AMPICILLIN <=2 SENSITIVE Sensitive     CEFAZOLIN <=4 SENSITIVE Sensitive     CEFTRIAXONE <=1 SENSITIVE Sensitive     CIPROFLOXACIN <=0.25 SENSITIVE Sensitive     GENTAMICIN <=1 SENSITIVE Sensitive     IMIPENEM <=0.25 SENSITIVE Sensitive     NITROFURANTOIN  <=16 SENSITIVE Sensitive     TRIMETH/SULFA <=20 SENSITIVE Sensitive     AMPICILLIN/SULBACTAM <=2 SENSITIVE Sensitive     PIP/TAZO <=4 SENSITIVE Sensitive     * >=100,000 COLONIES/mL ESCHERICHIA COLI  Blood culture (routine x 2)     Status: Abnormal   Collection Time: 10/27/2015  8:45 PM  Result Value Ref Range Status   Specimen Description BLOOD RIGHT ARM  Final   Special Requests BOTTLES DRAWN AEROBIC AND ANAEROBIC 6CC  Final   Culture  Setup Time   Final    GRAM POSITIVE COCCI IN CLUSTERS RECOVERED FROM BOTH BOTTLES. Gram Stain Report Called to,Read Back By and Verified With: HARVEY,T. [facility]) AT 7371 ON 11/07/2015 BY BAUGHAM,M. Performed at [facility]    Culture (A)  Final    STAPHYLOCOCCUS AUREUS SUSCEPTIBILITIES PERFORMED ON PREVIOUS CULTURE WITHIN THE LAST 5 DAYS. Performed at [facility]    Report Status 11/20/2015 FINAL  Final  Blood culture (routine x 2)     Status: Abnormal   Collection Time: 10/29/2015  9:15 PM  Result Value Ref Range Status   Specimen Description BLOOD LEFT ARM  Final   Special Requests BOTTLES DRAWN AEROBIC AND ANAEROBIC 6CC  Final   Culture  Setup Time   Final    GRAM POSITIVE COCCI IN  CLUSTERS RECOVERED FROMBOTH BOTTLES Gram Stain Report Called to,Read Back By and Verified With: HARVEY,T [facility]) AT 6269 ON 10/27/2015 BY BAUGHAM,M. Performed at [place] ID to follow CRITICAL RESULT CALLED TO, READ BACK BY AND VERIFIED WITH: R RUMBARGAR PHARMD 1800 11/23/2015 A BROWNING Performed at [place] (A)  Final   Report Status 10/31/2015 FINAL  Final   Organism ID, Bacteria METHICILLIN RESISTANT STAPHYLOCOCCUS AUREUS  Final      Susceptibility   Methicillin resistant staphylococcus aureus - MIC*    CIPROFLOXACIN >=8 RESISTANT Resistant     ERYTHROMYCIN >=8 RESISTANT Resistant     GENTAMICIN <=0.5 SENSITIVE Sensitive     OXACILLIN >=4 RESISTANT Resistant       TETRACYCLINE <=1 SENSITIVE Sensitive     VANCOMYCIN 1 SENSITIVE Sensitive     TRIMETH/SULFA <=10 SENSITIVE Sensitive     CLINDAMYCIN <=0.25 SENSITIVE Sensitive     RIFAMPIN <=0.5 SENSITIVE Sensitive     Inducible Clindamycin NEGATIVE Sensitive     * METHICILLIN RESISTANT STAPHYLOCOCCUS AUREUS  Blood Culture ID Panel (Reflexed)     Status: Abnormal   Collection Time: 11/17/2015  9:15 PM  Result Value Ref Range Status   Enterococcus species NOT DETECTED NOT DETECTED Final   Vancomycin resistance NOT DETECTED NOT DETECTED Final   Listeria monocytogenes NOT DETECTED NOT DETECTED Final   Staphylococcus species NOT DETECTED NOT DETECTED Final   Staphylococcus aureus DETECTED (A) NOT DETECTED Final    Comment: CRITICAL RESULT CALLED TO, READ BACK BY AND VERIFIED WITH: R RUMBARGAR PHARMD 1800 11/19/2015 A BROWNING    Methicillin resistance DETECTED (A) NOT DETECTED Final    Comment: CRITICAL RESULT CALLED TO, READ BACK BY AND VERIFIED WITH: R RUMBARGAR PHARMD 1800 11/05/2015 A BROWNING    Streptococcus species NOT DETECTED NOT DETECTED Final   Streptococcus agalactiae NOT DETECTED NOT DETECTED Final   Streptococcus pneumoniae NOT DETECTED NOT DETECTED Final   Streptococcus pyogenes NOT DETECTED NOT DETECTED Final   Acinetobacter baumannii NOT DETECTED NOT DETECTED Final   Enterobacteriaceae species NOT DETECTED NOT DETECTED Final   Enterobacter cloacae complex NOT DETECTED NOT DETECTED Final   Escherichia coli NOT DETECTED NOT DETECTED Final   Klebsiella oxytoca NOT DETECTED NOT DETECTED Final   Klebsiella pneumoniae NOT DETECTED NOT DETECTED Final   Proteus species NOT DETECTED NOT DETECTED Final   Serratia marcescens NOT DETECTED NOT DETECTED Final   Carbapenem resistance NOT DETECTED NOT DETECTED Final   Haemophilus influenzae NOT DETECTED NOT DETECTED Final   Neisseria meningitidis NOT DETECTED NOT DETECTED Final   Pseudomonas aeruginosa NOT DETECTED NOT DETECTED Final   Candida  albicans NOT DETECTED NOT DETECTED Final   Candida glabrata NOT DETECTED NOT DETECTED Final   Candida krusei NOT DETECTED NOT DETECTED Final   Candida parapsilosis NOT DETECTED NOT DETECTED Final   Candida tropicalis NOT DETECTED NOT DETECTED Final    Comment: Performed at [facility]  MRSA PCR Screening     Status: Abnormal   Collection Time: 10/28/2015  6:08 AM  Result Value Ref Range Status   MRSA by PCR POSITIVE (A) NEGATIVE Final    Comment:        The GeneXpert MRSA Assay (FDA approved for NASAL specimens only), is one component of a comprehensive MRSA colonization surveillance program. It is not intended to diagnose MRSA infection nor to guide or monitor treatment for MRSA infections. RESULT CALLED TO, READ BACK BY AND  VERIFIED WITH: Carma Leaven AT 2162 11/09/2015 BY [place]ock     Impression/Plan:  1. Sepsis - MRSA - will repeat blood cultures tomorrow to see if clearing.   2.  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  On zosyn. 3.  HIV - will defer starting ARVs for now until he better recovers, able to take po.  4. 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  Resultant thrombocytopenia 5.  Hepatitis C - reportedly treated in jail.  Will check viral load.  6. Thrombocytopenia - cirrhosis and acute sepsis.

## 2015-11-13 NOTE — Progress Notes (Signed)
CRITICAL VALUE ALERT  Critical value received:  Platelets 25  Date of notification:  11/23/2015  Time of notification:  02:36  Critical value read back:Yes.    Nurse who received alert:  BShepherd, RN  MD notified (1st page):  Mannam, P.  Time of first page:  02:38  MD notified (2nd page):  Time of second page:  Responding MD:  Mannam, P.  Time MD responded:  02:39

## 2015-11-13 NOTE — Progress Notes (Signed)
Pt had 17 beat run of vtach. VSS throughout event. MD Sood made aware- no new orders.   Will monitor  Casey Pennington

## 2015-11-13 NOTE — Op Note (Signed)
Preoperative diagnosis: open abdomen, ischemic bowel  Postoperative diagnosis: same   Procedure: reopening of recent laparotomy, small bowel resection, placement of negative pressure dressing 100cm^2 Surgeon: Gurney Maxin, M.D.  Asst: P.J. Toth  Anesthesia: general  Indications for procedure: Casey Pennington is a 60 y.o. year old male with symptoms of hypotension, MOF, critically ill intubated and on pressors after "drinking binge" found to have dead terminal ileum on previous exploration.  Description of procedure: The patient was brought into the operative suite. Anesthesia was administered with General endotracheal anesthesia. WHO checklist was applied. The patient was then placed in supine. The area was prepped and draped in the usual sterile fashion.  Previous Apthera wound VAC was taken off. Entirety the abdomen was irrigated with 2 L of warmed saline. Next urine the bowel from the ligament of Treitz to the end. This was approximately 240 cm total. The last 60 cm had multiple areas of complete necrosis near perforation and multiple patches of near-complete ischemia. Because of this we resected the last 60 cm of small intestine ligating the vessels of the mesentery with multiple firings of the LigaSure device and using a 75 mm Endo GIA stapler for the resection of the bowel portion. There were multiple areas of small amount of bleeding on the mesentery that were closed with 3-0 silk figure-of-eight stitches. This left him with 175 cm distal to the ligament of Treitz. We then mobilized the right colon there is no full-thickness necrosis, in general the colon had no signs of frank ischemia. Because of the amount of necrosis seen in the small bowel, his ongoing pressor dependent sepsis we decided not to perform an anastomosis or close his abdomen. Therefore, we again irrigated out the abdomen. We ligated the falciform ligament with ligature. We placed the plastic wrapped Apthera blue wound VAC  over all visceral contents. We then placed the first blue Apthera sponge under the fascia and then second in the subcutaneous area  Findings: small bowel ischemia  Specimen: small bowel  Implant: abtherra wound device   Blood loss: 127m  Local anesthesia:   Complications: none  LGurney Maxin M.D. General, Bariatric, & Minimally Invasive Surgery [facility]

## 2015-11-13 NOTE — Anesthesia Postprocedure Evaluation (Signed)
Anesthesia Post Note  Patient: Casey Pennington  Procedure(s) Performed: Procedure(s) (LRB): RE-EXPLORATORY OF ABDOMEN  SMALL BOWEL RESECTION AND PLACEMENT OF NEGATIVE PRESSURE WOUND VAC. (N/A)  Patient location during evaluation: ICU Anesthesia Type: General Level of consciousness: sedated Pain management: pain level controlled Vital Signs Assessment: post-procedure vital signs reviewed and stable Respiratory status: patient remains intubated per anesthesia plan Cardiovascular status: stable Postop Assessment: no signs of nausea or vomiting Anesthetic complications: no    Last Vitals:  Filed Vitals:   11/24/2015 1545 11/18/2015 1600  BP:  96/69  Pulse: 80 79  Temp:    Resp: 20 20    Last Pain: There were no vitals filed for this visit.               Irys Nigh

## 2015-11-13 NOTE — Progress Notes (Signed)
ANTICOAGULATION CONSULT NOTE - Follow-up Consult  Pharmacy Consult for Heparin Indication: NSTEMI  No Known Allergies  Patient Measurements: Height: 5' 9" (175.3 cm) Weight: 153 lb 14.1 oz (69.8 kg) IBW/kg (Calculated) : 70.7 Heparin Dosing Weight: 65 kg  Vital Signs: Temp: 95.4 F (35.2 C) (05/20 0115) Temp Source: Core (Comment) (05/20 0000) BP: 134/86 mmHg (05/20 0100) Pulse Rate: 82 (05/20 0115)  Labs:  Recent Labs  11/01/2015 2045 11/05/2015 0515 10/26/2015 0915 11/03/2015 1049 11/03/2015 1530 11/12/15 0500 11/12/15 0548 11/12/15 1145 11/12/15 1341 11/12/15 1600 11/12/15 2215 10/27/2015 0022  HGB  --  15.5  --  11.9* 12.1*  --  12.3*  --   --   --   --   --   HCT  --  47.1  --  35.0* 38.7*  --  37.2*  --   --   --   --   --   PLT  --  88*  --   --  38*  --  38*  --   --   --   --   --   LABPROT  --  24.4*  --   --   --   --   --   --  27.9*  --   --   --   INR  --  2.22*  --   --   --   --   --   --  2.65*  --   --   --   HEPARINUNFRC  --   --   --   --   --  <0.10*  --   --   --   --   --  <0.10*  CREATININE  --  3.86*  --   --  4.17*  --  5.48*  --   --  6.00*  --   --   CKTOTAL  --   --   --   --   --   --  16970* 13318*  --   --  18102*  --   TROPONINI 8.53*  --  25.27*  --  27.18*  --   --   --   --   --   --   --     Estimated Creatinine Clearance: 13.2 mL/min (by C-G formula based on Cr of 6).   Assessment: 60 yo M on heparin for NSTEMI. Trop up to 27. Heparin restarted post line replacement on 5/19. Heparin level remains undetectable on 1000 units/hr. No issues with line or bleeding reported per RN. INR remains elevated at 2.65.  Heparin to be turned off at 0300 as pt going back to OR today for possible anastamosis distal ileum to R colon.  Goal of Therapy:  Heparin level 0.3-0.7 units/ml Monitor platelets by anticoagulation protocol: Yes    Plan:  Will not increase heparin gtt at this time as it is about to be turned off Will f/u restart post OR -  likely need to restart at ~1300 units/hr  Sherlon Handing, PharmD, BCPS Clinical pharmacist, pager [phone] 10/30/2015 1:32 AM

## 2015-11-13 NOTE — Procedures (Signed)
Admit: 11/09/2015 LOS: 3  51M with anuric AKI 2/2 Rhabdomyolysis, Septic Shock, in setting of HCV Cirrhosis, Intestinal Ischemia s/p SB resection, HIV untreated, polysubstance abuse  Current CRRT Prescription: Start Date: 11/12/15 Catheter: R IJ Temp Cath placed 5/19 CCM BFR: 250 Pre Blood Pump: 1000 4K DFR: 1500 4K Replacement Rate: 400 4K Goal UF: net even Anticoagulation: none Clotting: none   S: Started CRRT no issues Low dose NE To OR this AM  O: 05/19 0701 - 05/20 0700 In: 2594.1 [I.V.:2034.1; NG/GT:60; IV Piggyback:500] Out: 3646 [Urine:15; Emesis/NG output:250; Drains:850]  Filed Weights   10/25/2015 0430 11/12/15 0215 11/09/2015 0500  Weight: 65.2 kg (143 lb 11.8 oz) 69.8 kg (153 lb 14.1 oz) 70 kg (154 lb 5.2 oz)     Recent Labs Lab 11/16/2015 0515  11/12/15 0548 11/12/15 1600 11/14/2015 0200  NA 144  < > 145 146* 142  K 5.3*  < > 4.8 4.7 4.2  CL 112*  < > 101 102 104  CO2 16*  < > _0 GLUCOSE 94  < > 109* 127* 122*  BUN 70*  < > 105* 120* 73*  CREATININE 3.86*  < > 5.48* 6.00* 3.64*  CALCIUM 6.9*  < > 6.2* 6.0* 6.6*  PHOS 7.7*  --   --  6.5* 3.8  < > = values in this interval not displayed.  Recent Labs Lab 11/24/2015 0515  10/26/2015 1530 11/12/15 0548 10/27/2015 0200  WBC 12.2*  --  6.5 13.9* 6.9  NEUTROABS 10.9*  --   --   --  6.1  HGB 15.5  < > 12.1* 12.3* 10.1*  HCT 47.1  < > 38.7* 37.2* 31.6*  MCV 101.5*  --  101.0* 98.7 99.4  PLT 88*  --  38* 38* 25*  < > = values in this interval not displayed.  Scheduled Meds: . antiseptic oral rinse  7 mL Mouth Rinse QID  . chlorhexidine gluconate (SAGE KIT)  15 mL Mouth Rinse BID  . Chlorhexidine Gluconate Cloth  6 each Topical Q0600  . fluconazole (DIFLUCAN) IV  200 mg Intravenous Q0600  . hydrocortisone sod succinate (SOLU-CORTEF) inj  50 mg Intravenous Q6H  . insulin aspart  0-9 Units Subcutaneous Q4H  . mupirocin ointment  1 application Nasal BID  . pantoprazole (PROTONIX) IV  40 mg Intravenous  QHS  . piperacillin-tazobactam (ZOSYN)  IV  2.25 g Intravenous Q6H  . pneumococcal 23 valent vaccine  0.5 mL Intramuscular Tomorrow-1000  . sodium chloride flush  3 mL Intravenous Q12H  . vancomycin  750 mg Intravenous Q24H   Continuous Infusions: . fentaNYL infusion INTRAVENOUS 200 mcg/hr (11/01/2015 0248)  . heparin Stopped (10/31/2015 0300)  . norepinephrine (LEVOPHED) Adult infusion 6 mcg/min (11/05/2015 0248)  . dialysis replacement fluid (prismasate) 1,000 mL/hr at 10/27/2015 0319  . dialysis replacement fluid (prismasate) 400 mL/hr at 11/20/2015 0546  . dialysate (PRISMASATE) 1,500 mL/hr at 10/25/2015 0641  .  sodium bicarbonate 150 mEq in sterile water 1000 mL infusion Stopped (11/12/15 1704)   PRN Meds:.sodium chloride, 0.9 % irrigation (POUR BTL), fentaNYL, heparin, heparin, midazolam, [DISCONTINUED] ondansetron **OR** ondansetron (ZOFRAN) IV, sodium chloride  ABG    Component Value Date/Time   PHART 7.445 10/25/2015 0159   PCO2ART 37.0 11/23/2015 0159   PO2ART 94.0 11/23/2015 0159   HCO3 25.9* 10/26/2015 0159   TCO2 27 10/27/2015 0159   ACIDBASEDEF 2.0 11/16/2015 1953   O2SAT 98.0 11/05/2015 0159    A 1. Anuric AKI 2/2 rhabdo,  sepsis 1. BL SCr 1.1 2. Start RRT 5/19 as CRRT 2. Rhabdomyolysis CK improving 3. Septic Shock 2/2 peritonitis on NE + PE; Vanc/Zosyn/Fluc 4. MRSA Bacteremia 5. Ischemic gut s/p ex-lap 5/18 6. VDRF 7. HCV Cirrhosis 8. TCP 9. pHTN 10. HIV 11. Polysubstance abuse 12. NTEMI 13. AMS  Plan 1. Cont CRRT, all 4K, no heparin in circuit, if stable post OR can attempt 12m net neg/hr  RPearson Grippe MD [facility] [phone]

## 2015-11-13 NOTE — Progress Notes (Signed)
PULMONARY / CRITICAL CARE MEDICINE   Name: Casey Pennington MRN: [id number] DOB: Jul 22, 1955    ADMISSION DATE:  10/26/2015  REFERRING MD:  ER  CHIEF COMPLAINT:  Altered mental status  SUBJECTIVE:  Returned from [place]: BP 94/69 mmHg  Pulse 87  Temp(Src) 97.3 F (36.3 C) (Core (Comment))  Resp 20  Ht 5' 9" (1.753 m)  Wt 154 lb 5.2 oz (70 kg)  BMI 22.78 kg/m2  SpO2 96%  HEMODYNAMICS: CVP:  [2 mmHg-90 mmHg] 2 mmHg  VENTILATOR SETTINGS: Vent Mode:  [-] PRVC FiO2 (%):  [50 %] 50 % Set Rate:  [20 bmp] 20 bmp Vt Set:  [570 mL] 570 mL PEEP:  [5 cmH20] 5 cmH20 Plateau Pressure:  [18 cmH20-19 cmH20] 19 cmH20  INTAKE / OUTPUT: I/O last 3 completed shifts: In: 4609.4 [I.V.:3789.4; NG/GT:120; IV Piggyback:700] Out: 2595 [Urine:26; Emesis/NG output:550; Drains:1300; Other:719]  PHYSICAL EXAMINATION: General:  Ill appearing Neuro:  RASS -4 HEENT:  ETT Cardiovascular:  regular Lungs:  Scattered rhonchi Abdomen:  Wound vac Musculoskeletal:  1+ edema Skin:  No rashes  LABS:  BMET  Recent Labs Lab 11/12/15 0548 11/12/15 1600 11/23/2015 0200  NA 145 146* 142  K 4.8 4.7 4.2  CL 101 102 104  CO2 _0 BUN 105* 120* 73*  CREATININE 5.48* 6.00* 3.64*  GLUCOSE 109* 127* 122*    Electrolytes  Recent Labs Lab 11/03/2015 0515  11/12/15 0548 11/12/15 1600 11/07/2015 0200  CALCIUM 6.9*  < > 6.2* 6.0* 6.6*  MG 2.3  --   --   --  2.1  PHOS 7.7*  --   --  6.5* 3.8  < > = values in this interval not displayed.  CBC  Recent Labs Lab 10/26/2015 1530 11/12/15 0548 11/04/2015 0200  WBC 6.5 13.9* 6.9  HGB 12.1* 12.3* 10.1*  HCT 38.7* 37.2* 31.6*  PLT 38* 38* 25*    Coag's  Recent Labs Lab 11/23/2015 0515 11/12/15 1341  INR 2.22* 2.65*    Sepsis Markers  Recent Labs Lab 10/28/2015 2045 11/03/2015 2115 10/27/2015 0535  LATICACIDVEN  --  2.2* 2.6*  PROCALCITON 1.69  --   --     ABG  Recent Labs Lab 11/23/2015 1220 11/18/2015 1953 11/23/2015 0159   PHART 7.305* 7.310* 7.445  PCO2ART 40.1 49.7* 37.0  PO2ART 144.0* 94.0 94.0    Liver Enzymes  Recent Labs Lab 11/01/2015 2045 10/30/2015 0515 11/12/15 1145 11/12/15 1600 11/16/2015 0200  AST 1554* 1191* 725*  --   --   ALT 359* 372* 418*  --   --   ALKPHOS 116 97 69  --   --   BILITOT 1.7* 5.7* 16.2*  --   --   ALBUMIN 4.1 2.7* 2.2* 2.1* 2.1*    Cardiac Enzymes  Recent Labs Lab 11/20/2015 2045 11/02/2015 0915 11/03/2015 1530  TROPONINI 8.53* 25.27* 27.18*    Glucose  Recent Labs Lab 11/12/15 1117 11/12/15 1536 11/12/15 1915 10/27/2015 0010 10/29/2015 0313 10/27/2015 0732  GLUCAP 116* 120* 116* 123* 108* 109*    Imaging Dg Chest Port 1 View  11/12/2015  CLINICAL DATA:  Encounter for central line placement, history of hypertension, cirrhosis, pulmonary hypertension, HIV, hepatitis-C EXAM: PORTABLE CHEST 1 VIEW COMPARISON:  Portable exam 1427 hours compared to 11/05/2015 FINDINGS: Tip of endotracheal tube projects 5.0 cm above carina. Nasogastric tube extends into stomach. LEFT jugular central venous catheter with tip projecting over SVC. New LEFT subclavian central venous catheter with tip projecting  over SVC. Additional tubing projects over the RIGHT apex and LEFT chest, presumed external. EKG leads present. Upper normal heart size with enlarged central pulmonary arteries question pulmonary arterial hypertension. Mediastinal contours otherwise normal. Bibasilar opacities question atelectasis though cannot exclude infiltrate in the retrocardiac LEFT lower lobe. Upper lungs clear. Skin folds project over RIGHT upper lobe. No pleural effusion or pneumothorax. Bones demineralized. IMPRESSION: No pneumothorax following LEFT subclavian line placement. Pulmonary hypertension. Bibasilar atelectasis, cannot exclude retrocardiac LEFT lower lobe infiltrate. Electronically Signed   By: Lavonia Dana M.D.   On: 11/12/2015 14:42     STUDIES:  5/17 CT abd/pelvis >> pneumatosis SB 5/18 Echo >> EF 55  to 55%, grade 1 diastolic CHF, PAS 47 mmHg  CULTURES: 5/17 Urine >> E coli 5/17 Blood >> MRSA   ANTIBIOTICS: 5/17 Vancomycin >> 5/17 Zosyn >>  SIGNIFICANT EVENTS: 5/17 Admit, surgery consulted 5/19 ID consulted, renal consulted >> start CRRT  LINES/TUBES: 5/17 ETT >>  5/18 Lt IJ CVL >> 5/18 Lt femoral a line >> 5/19 Rt IJ HD cath >> 5/19 Lt [place] CVL >>   DISCUSSION: 60 yo male with ischemic SB with peritonitis, septic shock NSTEMI, AKI, VDRF, MRSA bacteremia after binge of illicit substances.  Hx of IVDA, HIV, Hep C with cirrhosis, Pulmonary HTN.  ASSESSMENT / PLAN:  PULMONARY A: Acute respiratory failure. P:   Full vent support F/u CXR, ABG  CARDIOVASCULAR A:  NSTEMI. Septic shock. Hx of pulmonary HTN. P:  Wean pressors to keep MAP > 65 D/c heparin gtt 5/20  RENAL A:   AKI. P:   CRRT per renal  GASTROINTESTINAL A:   SB ischemic s/p laparotomy. Protein calorie malnutrition. Hep C with cirrhosis. P:   NPO Protonix Post op care per [facility] Might need TNA  HEMATOLOGIC A:   Anemia, thrombocytopenia in setting of sepsis. Coagulopathy. P:  F/u CBC, INR SCDs  INFECTIOUS A:   Septic shock. MRSA bacteremia. E coli UTI. Peritonitis. Hx if HIV, Hep C. P:   Abx per ID  ENDOCRINE A:   Hyperglycemia. P:   SSI  NEUROLOGIC A:   Acute metabolic encephalopathy. P:   RASS goal -2 to -3   Updated pt's family at bedside.  CC time 32 minutes.  Chesley Mires, MD [facility] Pulmonary/Critical Care 11/06/2015, 11:03 AM Pager:  [phone] After 3pm call: [phone]

## 2015-11-13 NOTE — Anesthesia Preprocedure Evaluation (Signed)
Anesthesia Evaluation  Patient identified by MRN, date of birth, ID band Patient unresponsive    Reviewed: Unable to perform ROS - Chart review onlyPreop documentation limited or incomplete due to emergent nature of procedure.  Airway Mallampati: Intubated       Dental   Pulmonary Current Smoker,    breath sounds clear to auscultation       Cardiovascular hypertension,  Rhythm:Regular     Neuro/Psych    GI/Hepatic (+) Hepatitis -, C  Endo/Other    Renal/GU DialysisRenal disease     Musculoskeletal   Abdominal   Peds  Hematology  (+) anemia , HIV,   Anesthesia Other Findings   Reproductive/Obstetrics                             Anesthesia Physical Anesthesia Plan  ASA: IV  Anesthesia Plan: General   Post-op Pain Management:    Induction: Inhalational  Airway Management Planned: Oral ETT  Additional Equipment:   Intra-op Plan:   Post-operative Plan: Post-operative intubation/ventilation  Informed Consent:   Only emergency history available  Plan Discussed with: CRNA  Anesthesia Plan Comments:         Anesthesia Quick Evaluation

## 2015-11-13 NOTE — Transfer of Care (Signed)
Immediate Anesthesia Transfer of Care Note  Patient: Casey Pennington  Procedure(s) Performed: Procedure(s): RE-EXPLORATORY OF ABDOMEN  SMALL BOWEL RESECTION AND PLACEMENT OF NEGATIVE PRESSURE WOUND VAC. (N/A)  Patient Location: ICU  Anesthesia Type:General  Level of Consciousness: sedated and Patient remains intubated per anesthesia plan  Airway & Oxygen Therapy: Patient remains intubated per anesthesia plan and Patient placed on Ventilator (see vital sign flow sheet for setting)  Post-op Assessment: Report given to RN and Post -op Vital signs reviewed and stable  Post vital signs: Reviewed and stable  Last Vitals:  Filed Vitals:   11/20/2015 0745 11/07/2015 0800  BP:  112/73  Pulse: 84 87  Temp: 36.3 C   Resp: 20 22    Last Pain: There were no vitals filed for this visit.       Complications: No apparent anesthesia complications

## 2015-11-14 ENCOUNTER — Encounter (HOSPITAL_COMMUNITY): Payer: Self-pay | Admitting: *Deleted

## 2015-11-14 ENCOUNTER — Inpatient Hospital Stay (HOSPITAL_COMMUNITY): Payer: Medicaid Other

## 2015-11-14 LAB — RENAL FUNCTION PANEL
Albumin: 2.1 g/dL — ABNORMAL LOW (ref 3.5–5.0)
Albumin: 2.1 g/dL — ABNORMAL LOW (ref 3.5–5.0)
Anion gap: 11 (ref 5–15)
Anion gap: 8 (ref 5–15)
BUN: 35 mg/dL — ABNORMAL HIGH (ref 6–20)
BUN: 28 mg/dL — ABNORMAL HIGH (ref 6–20)
CALCIUM: 7.4 mg/dL — AB (ref 8.9–10.3)
CHLORIDE: 105 mmol/L (ref 101–111)
CO2: 25 mmol/L (ref 22–32)
CO2: 23 mmol/L (ref 22–32)
CREATININE: 2.02 mg/dL — AB (ref 0.61–1.24)
CREATININE: 1.77 mg/dL — AB (ref 0.61–1.24)
Calcium: 7.4 mg/dL — ABNORMAL LOW (ref 8.9–10.3)
Chloride: 102 mmol/L (ref 101–111)
GFR calc Af Amer: 40 mL/min — ABNORMAL LOW (ref >60)
GFR calc non Af Amer: 35 mL/min — ABNORMAL LOW (ref >60)
GFR calc non Af Amer: 41 mL/min — ABNORMAL LOW (ref >60)
GFR, EST AFRICAN AMERICAN: 47 mL/min — AB (ref >60)
GLUCOSE: 111 mg/dL — AB (ref 65–99)
Glucose, Bld: 131 mg/dL — ABNORMAL HIGH (ref 65–99)
Phosphorus: 3.2 mg/dL (ref 2.5–4.6)
Phosphorus: 3.5 mg/dL (ref 2.5–4.6)
Potassium: 4.7 mmol/L (ref 3.5–5.1)
Potassium: 4.2 mmol/L (ref 3.5–5.1)
SODIUM: 138 mmol/L (ref 135–145)
Sodium: 136 mmol/L (ref 135–145)

## 2015-11-14 LAB — PROTIME-INR
INR: 1.38 (ref 0.00–1.49)
PROTHROMBIN TIME: 17.1 s — AB (ref 11.6–15.2)

## 2015-11-14 LAB — PREPARE PLATELET PHERESIS
UNIT DIVISION: 0
Unit division: 0

## 2015-11-14 LAB — GLUCOSE, CAPILLARY
GLUCOSE-CAPILLARY: 105 mg/dL — AB (ref 65–99)
GLUCOSE-CAPILLARY: 103 mg/dL — AB (ref 65–99)
GLUCOSE-CAPILLARY: 98 mg/dL (ref 65–99)
Glucose-Capillary: 104 mg/dL — ABNORMAL HIGH (ref 65–99)
Glucose-Capillary: 107 mg/dL — ABNORMAL HIGH (ref 65–99)
Glucose-Capillary: 124 mg/dL — ABNORMAL HIGH (ref 65–99)

## 2015-11-14 LAB — CBC
HCT: 31.9 % — ABNORMAL LOW (ref 39.0–52.0)
Hemoglobin: 10 g/dL — ABNORMAL LOW (ref 13.0–17.0)
MCH: 31 pg (ref 26.0–34.0)
MCHC: 31.3 g/dL (ref 30.0–36.0)
MCV: 98.8 fL (ref 78.0–100.0)
PLATELETS: 45 10*3/uL — AB (ref 150–400)
RBC: 3.23 MIL/uL — ABNORMAL LOW (ref 4.22–5.81)
RDW: 16.5 % — AB (ref 11.5–15.5)
WBC: 6.9 10*3/uL (ref 4.0–10.5)

## 2015-11-14 LAB — POCT I-STAT 3, ART BLOOD GAS (G3+)
Bicarbonate: 24.8 mEq/L — ABNORMAL HIGH (ref 20.0–24.0)
O2 SAT: 92 %
PCO2 ART: 35.7 mmHg (ref 35.0–45.0)
PO2 ART: 56 mmHg — AB (ref 80.0–100.0)
Patient temperature: 96.2
TCO2: 26 mmol/L (ref 0–100)
pH, Arterial: 7.444 (ref 7.350–7.450)

## 2015-11-14 LAB — HEPATIC FUNCTION PANEL
ALT: 491 U/L — ABNORMAL HIGH (ref 17–63)
AST: 500 U/L — ABNORMAL HIGH (ref 15–41)
Albumin: 2.1 g/dL — ABNORMAL LOW (ref 3.5–5.0)
Alkaline Phosphatase: 87 U/L (ref 38–126)
BILIRUBIN DIRECT: 8.1 mg/dL — AB (ref 0.1–0.5)
BILIRUBIN INDIRECT: 4.6 mg/dL — AB (ref 0.3–0.9)
Total Bilirubin: 12.7 mg/dL — ABNORMAL HIGH (ref 0.3–1.2)
Total Protein: 6.2 g/dL — ABNORMAL LOW (ref 6.5–8.1)

## 2015-11-14 LAB — CK: Total CK: 1984 U/L — ABNORMAL HIGH (ref 49–397)

## 2015-11-14 LAB — MAGNESIUM: Magnesium: 2.4 mg/dL (ref 1.7–2.4)

## 2015-11-14 MED ORDER — MIDAZOLAM HCL 2 MG/2ML IJ SOLN
2.0000 mg | Freq: Once | INTRAMUSCULAR | Status: AC
  Administered 2015-11-14: 2 mg via INTRAVENOUS

## 2015-11-14 MED ORDER — SODIUM CHLORIDE 0.9% FLUSH
10.0000 mL | INTRAVENOUS | Status: DC | PRN
  Administered 2015-11-18: 10 mL
  Filled 2015-11-14: qty 40

## 2015-11-14 MED ORDER — FLUCONAZOLE IN SODIUM CHLORIDE 400-0.9 MG/200ML-% IV SOLN
400.0000 mg | Freq: Every day | INTRAVENOUS | Status: DC
  Administered 2015-11-15 – 2015-11-19 (×5): 400 mg via INTRAVENOUS
  Filled 2015-11-14 (×6): qty 200

## 2015-11-14 MED ORDER — SODIUM CHLORIDE 0.9% FLUSH
10.0000 mL | Freq: Two times a day (BID) | INTRAVENOUS | Status: DC
  Administered 2015-11-14 (×2): 10 mL
  Administered 2015-11-15: 40 mL
  Administered 2015-11-15: 10 mL
  Administered 2015-11-16: 40 mL
  Administered 2015-11-16 – 2015-11-17 (×2): 10 mL
  Administered 2015-11-17: 30 mL
  Administered 2015-11-18 – 2015-11-19 (×3): 10 mL
  Administered 2015-11-20: 30 mL
  Administered 2015-11-20 – 2015-11-21 (×4): 10 mL
  Administered 2015-11-22: 30 mL

## 2015-11-14 NOTE — Progress Notes (Signed)
[facility] for Infectious Disease   Reason for visit: Follow up on sepsis, HIV  Interval History: s/p surgery with small bowel resection, negative pressure dressing.  Previous ischemic bowel with ex lap and small bowel resection on 5/18.  Remains intubated, sedated.  On levophed.     Physical Exam: Constitutional:  Filed Vitals:   11/14/15 1045 11/14/15 1100  BP:  85/61  Pulse: 81 81  Temp:    Resp: 20 20   patient appears in NAD Eyes: anicteric HENT: ET in place; left IJ, left subclavian, right dialysis cath Respiratory: Normal respiratory effort; CTA B, anterior exam Cardiovascular: tachy RR GI: post surgical  Review of Systems: Unable to be assessed due to mental status  Lab Results  Component Value Date   WBC 6.9 11/14/2015   HGB 10.0* 11/14/2015   HCT 31.9* 11/14/2015   MCV 98.8 11/14/2015   PLT 45* 11/14/2015    Lab Results  Component Value Date   CREATININE 2.02* 11/14/2015   BUN 35* 11/14/2015   NA 138 11/14/2015   K 4.7 11/14/2015   CL 102 11/14/2015   CO2 25 11/14/2015    Lab Results  Component Value Date   ALT 491* 11/14/2015   AST 500* 11/14/2015   ALKPHOS 87 11/14/2015     Microbiology: Recent Results (from the past 240 hour(s))  Urine culture     Status: Abnormal   Collection Time: 11/01/2015  5:20 PM  Result Value Ref Range Status   Specimen Description URINE, CATHETERIZED  Final   Special Requests NONE  Final   Culture >=100,000 COLONIES/mL ESCHERICHIA COLI (A)  Final   Report Status 11/14/2015 FINAL  Final   Organism ID, Bacteria ESCHERICHIA COLI (A)  Final      Susceptibility   Escherichia coli - MIC*    AMPICILLIN <=2 SENSITIVE Sensitive     CEFAZOLIN <=4 SENSITIVE Sensitive     CEFTRIAXONE <=1 SENSITIVE Sensitive     CIPROFLOXACIN <=0.25 SENSITIVE Sensitive     GENTAMICIN <=1 SENSITIVE Sensitive     IMIPENEM <=0.25 SENSITIVE Sensitive     NITROFURANTOIN <=16 SENSITIVE Sensitive     TRIMETH/SULFA <=20 SENSITIVE Sensitive       AMPICILLIN/SULBACTAM <=2 SENSITIVE Sensitive     PIP/TAZO <=4 SENSITIVE Sensitive     * >=100,000 COLONIES/mL ESCHERICHIA COLI  Blood culture (routine x 2)     Status: Abnormal   Collection Time: 10/26/2015  8:45 PM  Result Value Ref Range Status   Specimen Description BLOOD RIGHT ARM  Final   Special Requests BOTTLES DRAWN AEROBIC AND ANAEROBIC 6CC  Final   Culture  Setup Time   Final    GRAM POSITIVE COCCI IN CLUSTERS RECOVERED FROM BOTH BOTTLES. Gram Stain Report Called to,Read Back By and Verified With: HARVEY,T. [facility]) AT 4403 ON 11/02/2015 BY BAUGHAM,M. Performed at [facility]    Culture (A)  Final    STAPHYLOCOCCUS AUREUS SUSCEPTIBILITIES PERFORMED ON PREVIOUS CULTURE WITHIN THE LAST 5 DAYS. Performed at [facility]    Report Status 11/12/2015 FINAL  Final  Blood culture (routine x 2)     Status: Abnormal   Collection Time: 11/03/2015  9:15 PM  Result Value Ref Range Status   Specimen Description BLOOD LEFT ARM  Final   Special Requests BOTTLES DRAWN AEROBIC AND ANAEROBIC 6CC  Final   Culture  Setup Time   Final    GRAM POSITIVE COCCI IN CLUSTERS RECOVERED [employer] BOTTLES Gram Stain Report Called  to,Read Back By and Verified With: HARVEY,T [facility]) AT 1478 ON 11/21/2015 BY BAUGHAM,M. Performed at [facility] ID to follow CRITICAL RESULT CALLED TO, READ BACK BY AND VERIFIED WITH: R RUMBARGAR PHARMD 1800 10/30/2015 A BROWNING Performed at [facility] (A)  Final   Report Status 11/12/2015 FINAL  Final   Organism ID, Bacteria METHICILLIN RESISTANT STAPHYLOCOCCUS AUREUS  Final      Susceptibility   Methicillin resistant staphylococcus aureus - MIC*    CIPROFLOXACIN >=8 RESISTANT Resistant     ERYTHROMYCIN >=8 RESISTANT Resistant     GENTAMICIN <=0.5 SENSITIVE Sensitive     OXACILLIN >=4 RESISTANT Resistant     TETRACYCLINE <=1 SENSITIVE Sensitive     VANCOMYCIN 1 SENSITIVE  Sensitive     TRIMETH/SULFA <=10 SENSITIVE Sensitive     CLINDAMYCIN <=0.25 SENSITIVE Sensitive     RIFAMPIN <=0.5 SENSITIVE Sensitive     Inducible Clindamycin NEGATIVE Sensitive     * METHICILLIN RESISTANT STAPHYLOCOCCUS AUREUS  Blood Culture ID Panel (Reflexed)     Status: Abnormal   Collection Time: 11/09/2015  9:15 PM  Result Value Ref Range Status   Enterococcus species NOT DETECTED NOT DETECTED Final   Vancomycin resistance NOT DETECTED NOT DETECTED Final   Listeria monocytogenes NOT DETECTED NOT DETECTED Final   Staphylococcus species NOT DETECTED NOT DETECTED Final   Staphylococcus aureus DETECTED (A) NOT DETECTED Final    Comment: CRITICAL RESULT CALLED TO, READ BACK BY AND VERIFIED WITH: R RUMBARGAR PHARMD 1800 11/19/2015 A BROWNING    Methicillin resistance DETECTED (A) NOT DETECTED Final    Comment: CRITICAL RESULT CALLED TO, READ BACK BY AND VERIFIED WITH: R RUMBARGAR PHARMD 1800 11/04/2015 A BROWNING    Streptococcus species NOT DETECTED NOT DETECTED Final   Streptococcus agalactiae NOT DETECTED NOT DETECTED Final   Streptococcus pneumoniae NOT DETECTED NOT DETECTED Final   Streptococcus pyogenes NOT DETECTED NOT DETECTED Final   Acinetobacter baumannii NOT DETECTED NOT DETECTED Final   Enterobacteriaceae species NOT DETECTED NOT DETECTED Final   Enterobacter cloacae complex NOT DETECTED NOT DETECTED Final   Escherichia coli NOT DETECTED NOT DETECTED Final   Klebsiella oxytoca NOT DETECTED NOT DETECTED Final   Klebsiella pneumoniae NOT DETECTED NOT DETECTED Final   Proteus species NOT DETECTED NOT DETECTED Final   Serratia marcescens NOT DETECTED NOT DETECTED Final   Carbapenem resistance NOT DETECTED NOT DETECTED Final   Haemophilus influenzae NOT DETECTED NOT DETECTED Final   Neisseria meningitidis NOT DETECTED NOT DETECTED Final   Pseudomonas aeruginosa NOT DETECTED NOT DETECTED Final   Candida albicans NOT DETECTED NOT DETECTED Final   Candida glabrata NOT DETECTED  NOT DETECTED Final   Candida krusei NOT DETECTED NOT DETECTED Final   Candida parapsilosis NOT DETECTED NOT DETECTED Final   Candida tropicalis NOT DETECTED NOT DETECTED Final    Comment: Performed at [facility]  MRSA PCR Screening     Status: Abnormal   Collection Time: 11/07/2015  6:08 AM  Result Value Ref Range Status   MRSA by PCR POSITIVE (A) NEGATIVE Final    Comment:        The GeneXpert MRSA Assay (FDA approved for NASAL specimens only), is one component of a comprehensive MRSA colonization surveillance program. It is not intended to diagnose MRSA infection nor to guide or monitor treatment for MRSA infections. RESULT CALLED TO, READ BACK BY AND VERIFIED WITH: Carma Leaven AT 2956 11/12/2015 BY K  BARR     Impression/Plan:  1. Sepsis - MRSA - blood cultures repeated today 2.  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  On zosyn. 3.  HIV - will defer starting ARVs for now until he better recovers, able to take po.  4. 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  Resultant thrombocytopenia 5.  Hepatitis C - reportedly treated in jail. Viral load sent.  6. Thrombocytopenia - cirrhosis and acute sepsis.

## 2015-11-14 NOTE — Progress Notes (Signed)
Patient ID: Casey Pennington, male   DOB: 28-Jun-1955, 60 y.o.   MRN: [id number] 1 Day Post-Op  Subjective: Taken back to OR yesterday, additional ischemia seen.   Objective: Vital signs in last 24 hours: Temp:  [96.5 F (35.8 C)-98 F (36.7 C)] 98 F (36.7 C) (05/21 1145) Pulse Rate:  [71-98] 78 (05/21 1115) Resp:  [7-28] 20 (05/21 1115) BP: (85-130)/(59-87) 85/61 mmHg (05/21 1100) SpO2:  [87 %-100 %] 95 % (05/21 1115) Arterial Line BP: (81-149)/(46-84) 81/46 mmHg (05/21 1115) FiO2 (%):  [50 %-60 %] 50 % (05/21 0957) Weight:  [69.4 kg (153 lb)] 69.4 kg (153 lb) (05/21 0400)    Intake/Output from previous day: 05/20 0701 - 05/21 0700 In: 2359.1 [I.V.:1439.1; Blood:430; NG/GT:90; IV [id number]:449] Out: 6759 [Urine:55; Emesis/NG output:350; Drains:800; Blood:20] Intake/Output this shift: Total I/O In: 375.2 [I.V.:175.2; IV Piggyback:200] Out: 163 [Emesis/NG output:100; Drains:100; Other:549]  General appearance: no distress Resp: few rhonchi Cardio: regular rate and rhythm GI: open abdomen VAC  Lab Results:   Recent Labs  11/23/2015 0200 11/14/15 0324  WBC 6.9 6.9  HGB 10.1* 10.0*  HCT 31.6* 31.9*  PLT 25* 45*   BMET  Recent Labs  10/25/2015 1312 11/14/15 0325  NA 140 138  K 4.6 4.7  CL 105 102  CO2 25 25  GLUCOSE 115* 111*  BUN 54* 35*  CREATININE 2.81* 2.02*  CALCIUM 6.9* 7.4*   PT/INR  Recent Labs  11/12/15 1341 11/14/15 0324  LABPROT 27.9* 17.1*  INR 2.65* 1.38   ABG  Recent Labs  11/24/2015 0159 11/14/15 0305  PHART 7.445 7.444  HCO3 25.9* 24.8*    Studies/Results: Dg Chest Port 1 View  11/14/2015  CLINICAL DATA:  Respiratory failure EXAM: PORTABLE CHEST 1 VIEW COMPARISON:  Nov 13, 2015 FINDINGS: Stable support apparatus. The cardiomediastinal silhouette is stable. No pneumothorax. Mild opacity in the medial right lung base has improved. No other interval changes. IMPRESSION: No interval changes.  Stable support apparatus. Electronically  Signed   By: Dorise Bullion III M.D   On: 11/14/2015 10:03   Dg Chest Port 1 View  11/07/2015  CLINICAL DATA:  Acute respiratory failure. EXAM: PORTABLE CHEST 1 VIEW COMPARISON:  11/12/2015 FINDINGS: Mild medial lung base atelectasis, stable. No new lung abnormalities. No convincing pleural effusion and no pneumothorax. Endotracheal tube tip currently projects 3 cm above the carina. Right internal and left internal jugular central venous catheters and the nasal/ orogastric tube are stable and well positioned. IMPRESSION: 1. Lung base opacity, most likely atelectasis, stable from prior exam. No convincing pneumonia. No pulmonary edema. 2. Support apparatus is well positioned. Electronically Signed   By: Lajean Manes M.D.   On: 11/24/2015 11:09   Dg Chest Port 1 View  11/12/2015  CLINICAL DATA:  Encounter for central line placement, history of hypertension, cirrhosis, pulmonary hypertension, HIV, hepatitis-C EXAM: PORTABLE CHEST 1 VIEW COMPARISON:  Portable exam 1427 hours compared to 11/02/2015 FINDINGS: Tip of endotracheal tube projects 5.0 cm above carina. Nasogastric tube extends into stomach. LEFT jugular central venous catheter with tip projecting over SVC. New LEFT subclavian central venous catheter with tip projecting over SVC. Additional tubing projects over the RIGHT apex and LEFT chest, presumed external. EKG leads present. Upper normal heart size with enlarged central pulmonary arteries question pulmonary arterial hypertension. Mediastinal contours otherwise normal. Bibasilar opacities question atelectasis though cannot exclude infiltrate in the retrocardiac LEFT lower lobe. Upper lungs clear. Skin folds project over RIGHT upper lobe. No pleural effusion or  pneumothorax. Bones demineralized. IMPRESSION: No pneumothorax following LEFT subclavian line placement. Pulmonary hypertension. Bibasilar atelectasis, cannot exclude retrocardiac LEFT lower lobe infiltrate. Electronically Signed   By: Lavonia Dana M.D.   On: 11/12/2015 14:42    Anti-infectives: Anti-infectives    Start     Dose/Rate Route Frequency Ordered Stop   10/31/2015 0600  fluconazole (DIFLUCAN) IVPB 400 mg     400 mg 100 mL/hr over 120 Minutes Intravenous Daily 11/14/15 0913     11/17/2015 1000  vancomycin (VANCOCIN) IVPB 750 mg/150 ml premix     750 mg 150 mL/hr over 60 Minutes Intravenous Every 24 hours 11/12/15 1343     11/12/15 2100  vancomycin (VANCOCIN) IVPB 1000 mg/200 mL premix  Status:  Discontinued     1,000 mg 200 mL/hr over 60 Minutes Intravenous Every 48 hours 10/28/2015 0622 11/12/15 0940   11/12/15 1400  piperacillin-tazobactam (ZOSYN) IVPB 2.25 g     2.25 g 100 mL/hr over 30 Minutes Intravenous Every 6 hours 11/12/15 1343     11/12/15 1030  vancomycin (VANCOCIN) 1,250 mg in sodium chloride 0.9 % 250 mL IVPB     1,250 mg 166.7 mL/hr over 90 Minutes Intravenous  Once 11/12/15 0940 11/12/15 1256   11/20/2015 2000  vancomycin (VANCOCIN) IVPB 750 mg/150 ml premix  Status:  Discontinued     750 mg 150 mL/hr over 60 Minutes Intravenous Every 24 hours 11/02/2015 2128 11/17/2015 0622   11/14/2015 1400  piperacillin-tazobactam (ZOSYN) IVPB 2.25 g  Status:  Discontinued     2.25 g 100 mL/hr over 30 Minutes Intravenous Every 8 hours 11/24/2015 0622 11/12/15 1343   11/17/2015 1000  abacavir-dolutegravir-lamiVUDine (TRIUMEQ) 600-50-300 MG per tablet 1 tablet  Status:  Discontinued     1 tablet Oral Daily 11/09/2015 2112 10/29/2015 0517   11/21/2015 0700  fluconazole (DIFLUCAN) IVPB 200 mg  Status:  Discontinued     200 mg 100 mL/hr over 60 Minutes Intravenous Daily 10/25/2015 0621 11/14/15 0913   10/26/2015 0600  piperacillin-tazobactam (ZOSYN) IVPB 3.375 g  Status:  Discontinued     3.375 g 12.5 mL/hr over 240 Minutes Intravenous Every 8 hours 10/31/2015 2128 11/24/2015 0622   11/08/2015 2130  vancomycin (VANCOCIN) IVPB 1000 mg/200 mL premix     1,000 mg 200 mL/hr over 60 Minutes Intravenous  Once 11/17/2015 2112 11/12/2015 2302   11/02/2015  2115  piperacillin-tazobactam (ZOSYN) IVPB 3.375 g     3.375 g 100 mL/hr over 30 Minutes Intravenous  Once 11/24/2015 2112 11/19/2015 2216   10/25/2015 2000  piperacillin-tazobactam (ZOSYN) IVPB 3.375 g  Status:  Discontinued    Comments:  Give as soon as cultures drawn   3.375 g 100 mL/hr over 30 Minutes Intravenous  Once 10/29/2015 1953 11/07/2015 2123   11/05/2015 2000  vancomycin (VANCOCIN) IVPB 1000 mg/200 mL premix  Status:  Discontinued    Comments:  Give as soon as cultures drawn   1,000 mg 200 mL/hr over 60 Minutes Intravenous  Once 11/01/2015 1953 10/26/2015 2123      Assessment/Plan: s/p Procedure(s): RE-EXPLORATORY OF ABDOMEN  SMALL BOWEL RESECTION AND PLACEMENT OF NEGATIVE PRESSURE WOUND VAC. (N/A)  Bowel in discontinuity - NGT Plan second look tomorrow with Dr. Ninfa Linden for possible anastamosis vs additional resection Hepatorenal failure - poor prognosis    LOS: 4 days    [facility] 11/14/2015

## 2015-11-14 NOTE — Procedures (Signed)
Admit: 11/17/2015 LOS: 4  42M with anuric AKI 2/2 Rhabdomyolysis, Septic Shock, in setting of HCV Cirrhosis, Intestinal Ischemia s/p SB resection, HIV untreated, polysubstance abuse  Current CRRT Prescription: Start Date: 11/12/15 Catheter: R IJ Temp Cath placed 5/19 CCM BFR: 250 Pre Blood Pump: 1000 4K DFR: 1500 4K Replacement Rate: 400 4K Goal UF: net even Anticoagulation: none Clotting: none   S: Last 60cm of sb resected yesterday; left abdomen open Net event on CRRT NE weening off 50-60% FIO2  O: 05/20 0701 - 05/21 0700 In: 2359.1 [I.V.:1439.1; Blood:430; NG/GT:90; IV [id number]:573] Out: 2459 [Urine:55; Emesis/NG output:350; Drains:800; Blood:20]  Filed Weights   11/12/15 0215 11/07/2015 0500 11/14/15 0400  Weight: 69.8 kg (153 lb 14.1 oz) 70 kg (154 lb 5.2 oz) 69.4 kg (153 lb)     Recent Labs Lab 11/08/2015 0200 11/21/2015 1312 11/14/15 0325  NA 142 140 138  K 4.2 4.6 4.7  CL 104 105 102  CO2 _0 GLUCOSE 122* 115* 111*  BUN 73* 54* 35*  CREATININE 3.64* 2.81* 2.02*  CALCIUM 6.6* 6.9* 7.4*  PHOS 3.8 4.3 3.2    Recent Labs Lab 11/06/2015 0515  11/12/15 0548 11/20/2015 0200 11/14/15 0324  WBC 12.2*  < > 13.9* 6.9 6.9  NEUTROABS 10.9*  --   --  6.1  --   HGB 15.5  < > 12.3* 10.1* 10.0*  HCT 47.1  < > 37.2* 31.6* 31.9*  MCV 101.5*  < > 98.7 99.4 98.8  PLT 88*  < > 38* 25* 45*  < > = values in this interval not displayed.  Scheduled Meds: . antiseptic oral rinse  7 mL Mouth Rinse QID  . chlorhexidine gluconate (SAGE KIT)  15 mL Mouth Rinse BID  . Chlorhexidine Gluconate Cloth  6 each Topical Q0600  . fluconazole (DIFLUCAN) IV  200 mg Intravenous Q0600  . hydrocortisone sod succinate (SOLU-CORTEF) inj  50 mg Intravenous Q6H  . insulin aspart  0-9 Units Subcutaneous Q4H  . mupirocin ointment  1 application Nasal BID  . pantoprazole (PROTONIX) IV  40 mg Intravenous QHS  . piperacillin-tazobactam (ZOSYN)  IV  2.25 g Intravenous Q6H  . pneumococcal 23  valent vaccine  0.5 mL Intramuscular Tomorrow-1000  . sodium chloride flush  10-40 mL Intravenous Q12H  . sodium chloride flush  10-40 mL Intracatheter Q12H  . vancomycin  750 mg Intravenous Q24H   Continuous Infusions: . fentaNYL infusion INTRAVENOUS 200 mcg/hr (11/14/15 0610)  . norepinephrine (LEVOPHED) Adult infusion 3 mcg/min (11/14/15 0618)  . dialysis replacement fluid (prismasate) 1,000 mL/hr at 11/14/15 0745  . dialysis replacement fluid (prismasate) 400 mL/hr at 11/06/2015 2129  . dialysate (PRISMASATE) 1,500 mL/hr at 11/14/15 0622   PRN Meds:.sodium chloride, fentaNYL, heparin, heparin, midazolam, [DISCONTINUED] ondansetron **OR** ondansetron (ZOFRAN) IV, sodium chloride, sodium chloride flush  ABG    Component Value Date/Time   PHART 7.444 11/14/2015 0305   PCO2ART 35.7 11/14/2015 0305   PO2ART 56.0* 11/14/2015 0305   HCO3 24.8* 11/14/2015 0305   TCO2 26 11/14/2015 0305   ACIDBASEDEF 2.0 11/08/2015 1953   O2SAT 92.0 11/14/2015 0305    A 1. Anuric AKI 2/2 rhabdo, sepsis 1. BL SCr 1.1 2. Start RRT 5/19 as CRRT 2. Rhabdomyolysis CK improving 3. Septic Shock 2/2 peritonitis on NE + PE; Vanc/Zosyn/Fluc 4. MRSA Bacteremia 5. Ischemic gut s/p ex-lap 5/18 6. VDRF 7. HCV Cirrhosis 8. TCP 9. pHTN 10. HIV 11. Polysubstance abuse 12. NTEMI 13. AMS  Plan 1. Cont  CRRT, all 4K, no heparin in circuit, cont to attempt 60m net neg/hr  RPearson Grippe MD [facility] pgr [phone]

## 2015-11-14 NOTE — Progress Notes (Signed)
PULMONARY / CRITICAL CARE MEDICINE   Name: Casey Pennington MRN: [id number] DOB: 28-Dec-1955    ADMISSION DATE:  11/20/2015  REFERRING MD:  ER  CHIEF COMPLAINT:  Altered mental status  SUBJECTIVE:  Tolerating pressure support.  VITAL SIGNS: BP 112/62 mmHg  Pulse 84  Temp(Src) 97.6 F (36.4 C) (Oral)  Resp 25  Ht _0  (1.753 m)  Wt 153 lb (69.4 kg)  BMI 22.58 kg/m2  SpO2 96%  HEMODYNAMICS: CVP:  [2 mmHg-15 mmHg] 5 mmHg  VENTILATOR SETTINGS: Vent Mode:  [-] PRVC FiO2 (%):  [50 %-60 %] 50 % Set Rate:  [20 bmp] 20 bmp Vt Set:  [570 mL] 570 mL PEEP:  [5 cmH20] 5 cmH20 Pressure Support:  [10 cmH20] 10 cmH20 Plateau Pressure:  [18 cmH20-19 cmH20] 19 cmH20  INTAKE / OUTPUT: I/O last 3 completed shifts: In: 3320.5 [I.V.:2140.5; Blood:430; NG/GT:150; IV [id number]:622] Out: 6333 [Urine:70; Emesis/NG output:500; Drains:1200; Other:1825; Blood:20]  PHYSICAL EXAMINATION: General:  Ill appearing Neuro:  RASS +1 HEENT:  ETT Cardiovascular:  regular Lungs:  Scattered rhonchi Abdomen:  Wound vac Musculoskeletal:  1+ edema Skin:  No rashes  LABS:  BMET  Recent Labs Lab 11/20/2015 0200 11/04/2015 1312 11/14/15 0325  NA 142 140 138  K 4.2 4.6 4.7  CL 104 105 102  CO2 _1 BUN 73* 54* 35*  CREATININE 3.64* 2.81* 2.02*  GLUCOSE 122* 115* 111*    Electrolytes  Recent Labs Lab 11/03/2015 0515  11/14/2015 0200 10/27/2015 1312 11/14/15 0324 11/14/15 0325  CALCIUM 6.9*  < > 6.6* 6.9*  --  7.4*  MG 2.3  --  2.1  --  2.4  --   PHOS 7.7*  < > 3.8 4.3  --  3.2  < > = values in this interval not displayed.  CBC  Recent Labs Lab 11/12/15 0548 11/18/2015 0200 11/14/15 0324  WBC 13.9* 6.9 6.9  HGB 12.3* 10.1* 10.0*  HCT 37.2* 31.6* 31.9*  PLT 38* 25* 45*    Coag's  Recent Labs Lab 11/18/2015 0515 11/12/15 1341 11/14/15 0324  INR 2.22* 2.65* 1.38    Sepsis Markers  Recent Labs Lab 11/23/2015 2045 11/09/2015 2115 11/01/2015 0535  LATICACIDVEN  --  2.2* 2.6*   PROCALCITON 1.69  --   --     ABG  Recent Labs Lab 11/12/2015 1953 11/19/2015 0159 11/14/15 0305  PHART 7.310* 7.445 7.444  PCO2ART 49.7* 37.0 35.7  PO2ART 94.0 94.0 56.0*    Liver Enzymes  Recent Labs Lab 11/07/2015 0515 11/12/15 1145  10/25/2015 1312 11/14/15 0324 11/14/15 0325  AST 1191* 725*  --   --  500*  --   ALT 372* 418*  --   --  491*  --   ALKPHOS 97 69  --   --  87  --   BILITOT 5.7* 16.2*  --   --  12.7*  --   ALBUMIN 2.7* 2.2*  < > 2.1* 2.1* 2.1*  < > = values in this interval not displayed.  Cardiac Enzymes  Recent Labs Lab 11/03/2015 2045 11/04/2015 0915 11/18/2015 1530  TROPONINI 8.53* 25.27* 27.18*    Glucose  Recent Labs Lab 11/06/2015 1139 10/26/2015 1635 11/04/2015 2004 11/14/15 0019 11/14/15 0306 11/14/15 0725  GLUCAP 110* 98 106* 104* 105* 103*    Imaging Dg Chest Port 1 View  11/14/2015  CLINICAL DATA:  Respiratory failure EXAM: PORTABLE CHEST 1 VIEW COMPARISON:  Nov 13, 2015 FINDINGS: Stable support apparatus. The cardiomediastinal silhouette is  stable. No pneumothorax. Mild opacity in the medial right lung base has improved. No other interval changes. IMPRESSION: No interval changes.  Stable support apparatus. Electronically Signed   By: Dorise Bullion III M.D   On: 11/14/2015 10:03     STUDIES:  5/17 CT abd/pelvis >> pneumatosis SB 5/18 Echo >> EF 55 to 94%, grade 1 diastolic CHF, PAS 47 mmHg  CULTURES: 5/17 Urine >> E coli 5/17 Blood >> MRSA   ANTIBIOTICS: 5/17 Vancomycin >> 5/17 Zosyn >>  SIGNIFICANT EVENTS: 5/17 Admit, surgery consulted 5/19 ID consulted, renal consulted >> start CRRT  LINES/TUBES: 5/17 ETT >>  5/18 Lt IJ CVL >> 5/18 Lt femoral a line >> 5/19 Rt IJ HD cath >> 5/19 Lt [place] CVL >>   DISCUSSION: 60 yo male with ischemic SB with peritonitis, septic shock NSTEMI, AKI, VDRF, MRSA bacteremia after binge of illicit substances.  Hx of IVDA, HIV, Hep C with cirrhosis, Pulmonary HTN.  ASSESSMENT /  PLAN:  PULMONARY A: Acute respiratory failure. P:   Pressure support wean as tolerated >> defer extubation until done with surgery F/u CXR, ABG  CARDIOVASCULAR A:  NSTEMI. Septic shock. Hx of pulmonary HTN. P:  Wean pressors to keep MAP > 65  RENAL A:   AKI. P:   CRRT per renal  GASTROINTESTINAL A:   SB ischemic s/p laparotomy. Protein calorie malnutrition. Hep C with cirrhosis. P:   NPO Protonix Post op care per CCS Might need TNA   HEMATOLOGIC A:   Anemia, thrombocytopenia in setting of sepsis. Coagulopathy. P:  F/u CBC SCDs  INFECTIOUS A:   Septic shock. MRSA bacteremia. E coli UTI. Peritonitis. Hx if HIV, Hep C. P:   Abx per ID  ENDOCRINE A:   Hyperglycemia. P:   SSI  NEUROLOGIC A:   Acute metabolic encephalopathy. P:   RASS goal -1  CC time 32 minutes.  Chesley Mires, MD [facility] Pulmonary/Critical Care 11/14/2015, 10:06 AM Pager:  [phone] After 3pm call: [phone]

## 2015-11-15 ENCOUNTER — Inpatient Hospital Stay (HOSPITAL_COMMUNITY): Payer: Medicaid Other | Admitting: Anesthesiology

## 2015-11-15 ENCOUNTER — Inpatient Hospital Stay (HOSPITAL_COMMUNITY): Payer: Medicaid Other

## 2015-11-15 ENCOUNTER — Encounter (HOSPITAL_COMMUNITY): Admission: EM | Disposition: E | Payer: Self-pay | Source: Home / Self Care | Attending: Internal Medicine

## 2015-11-15 DIAGNOSIS — B20 Human immunodeficiency virus [HIV] disease: Secondary | ICD-10-CM

## 2015-11-15 DIAGNOSIS — F1910 Other psychoactive substance abuse, uncomplicated: Secondary | ICD-10-CM

## 2015-11-15 DIAGNOSIS — A419 Sepsis, unspecified organism: Secondary | ICD-10-CM

## 2015-11-15 DIAGNOSIS — D696 Thrombocytopenia, unspecified: Secondary | ICD-10-CM

## 2015-11-15 DIAGNOSIS — R6521 Severe sepsis with septic shock: Secondary | ICD-10-CM

## 2015-11-15 HISTORY — PX: LAPAROTOMY: SHX154

## 2015-11-15 LAB — CBC
HEMATOCRIT: 30 % — AB (ref 39.0–52.0)
HEMOGLOBIN: 9.6 g/dL — AB (ref 13.0–17.0)
MCH: 31.4 pg (ref 26.0–34.0)
MCHC: 32 g/dL (ref 30.0–36.0)
MCV: 98 fL (ref 78.0–100.0)
Platelets: 28 10*3/uL — CL (ref 150–400)
RBC: 3.06 MIL/uL — ABNORMAL LOW (ref 4.22–5.81)
RDW: 16.2 % — AB (ref 11.5–15.5)
WBC: 5.7 10*3/uL (ref 4.0–10.5)

## 2015-11-15 LAB — RENAL FUNCTION PANEL
ALBUMIN: 2 g/dL — AB (ref 3.5–5.0)
ANION GAP: 8 (ref 5–15)
ANION GAP: 6 (ref 5–15)
Albumin: 2.1 g/dL — ABNORMAL LOW (ref 3.5–5.0)
BUN: 24 mg/dL — AB (ref 6–20)
BUN: 24 mg/dL — ABNORMAL HIGH (ref 6–20)
CHLORIDE: 105 mmol/L (ref 101–111)
CHLORIDE: 106 mmol/L (ref 101–111)
CO2: 24 mmol/L (ref 22–32)
CO2: 24 mmol/L (ref 22–32)
CREATININE: 1.43 mg/dL — AB (ref 0.61–1.24)
Calcium: 7.4 mg/dL — ABNORMAL LOW (ref 8.9–10.3)
Calcium: 7.2 mg/dL — ABNORMAL LOW (ref 8.9–10.3)
Creatinine, Ser: 1.48 mg/dL — ABNORMAL HIGH (ref 0.61–1.24)
GFR calc Af Amer: 58 mL/min — ABNORMAL LOW (ref >60)
GFR calc non Af Amer: 50 mL/min — ABNORMAL LOW (ref >60)
GFR, EST NON AFRICAN AMERICAN: 53 mL/min — AB (ref >60)
GLUCOSE: 102 mg/dL — AB (ref 65–99)
Glucose, Bld: 122 mg/dL — ABNORMAL HIGH (ref 65–99)
PHOSPHORUS: 2.7 mg/dL (ref 2.5–4.6)
POTASSIUM: 4.6 mmol/L (ref 3.5–5.1)
POTASSIUM: 4.4 mmol/L (ref 3.5–5.1)
Phosphorus: 3.2 mg/dL (ref 2.5–4.6)
Sodium: 137 mmol/L (ref 135–145)
Sodium: 136 mmol/L (ref 135–145)

## 2015-11-15 LAB — BLOOD GAS, ARTERIAL
Acid-base deficit: 1.3 mmol/L (ref 0.0–2.0)
BICARBONATE: 22.4 meq/L (ref 20.0–24.0)
Drawn by: 331761
FIO2: 0.6
O2 Saturation: 92.6 %
PATIENT TEMPERATURE: 96
PEEP/CPAP: 5 cmH2O
RATE: 20 resp/min
TCO2: 23.5 mmol/L (ref 0–100)
VT: 570 mL
pCO2 arterial: 32 mmHg — ABNORMAL LOW (ref 35.0–45.0)
pH, Arterial: 7.453 — ABNORMAL HIGH (ref 7.350–7.450)
pO2, Arterial: 63.3 mmHg — ABNORMAL LOW (ref 80.0–100.0)

## 2015-11-15 LAB — GLUCOSE, CAPILLARY
GLUCOSE-CAPILLARY: 105 mg/dL — AB (ref 65–99)
GLUCOSE-CAPILLARY: 110 mg/dL — AB (ref 65–99)
GLUCOSE-CAPILLARY: 114 mg/dL — AB (ref 65–99)
GLUCOSE-CAPILLARY: 95 mg/dL (ref 65–99)
GLUCOSE-CAPILLARY: 96 mg/dL (ref 65–99)
Glucose-Capillary: 110 mg/dL — ABNORMAL HIGH (ref 65–99)

## 2015-11-15 LAB — MAGNESIUM: MAGNESIUM: 2.4 mg/dL (ref 1.7–2.4)

## 2015-11-15 LAB — SURGICAL PCR SCREEN
MRSA, PCR: POSITIVE — AB
Staphylococcus aureus: POSITIVE — AB

## 2015-11-15 SURGERY — LAPAROTOMY, EXPLORATORY
Anesthesia: General

## 2015-11-15 MED ORDER — FENTANYL CITRATE (PF) 250 MCG/5ML IJ SOLN
INTRAMUSCULAR | Status: AC
  Filled 2015-11-15: qty 5

## 2015-11-15 MED ORDER — PHENYLEPHRINE HCL 10 MG/ML IJ SOLN
INTRAMUSCULAR | Status: DC | PRN
  Administered 2015-11-15 (×3): 80 ug via INTRAVENOUS

## 2015-11-15 MED ORDER — PROPOFOL 10 MG/ML IV BOLUS
INTRAVENOUS | Status: AC
  Filled 2015-11-15: qty 20

## 2015-11-15 MED ORDER — ROCURONIUM BROMIDE 100 MG/10ML IV SOLN
INTRAVENOUS | Status: DC | PRN
  Administered 2015-11-15: 30 mg via INTRAVENOUS
  Administered 2015-11-15: 20 mg via INTRAVENOUS

## 2015-11-15 MED ORDER — MIDAZOLAM HCL 2 MG/2ML IJ SOLN
INTRAMUSCULAR | Status: AC
  Filled 2015-11-15: qty 2

## 2015-11-15 MED ORDER — PHENYLEPHRINE 40 MCG/ML (10ML) SYRINGE FOR IV PUSH (FOR BLOOD PRESSURE SUPPORT)
PREFILLED_SYRINGE | INTRAVENOUS | Status: AC
  Filled 2015-11-15: qty 20

## 2015-11-15 MED ORDER — PROPOFOL 10 MG/ML IV BOLUS
INTRAVENOUS | Status: DC | PRN
  Administered 2015-11-15: 50 mg via INTRAVENOUS

## 2015-11-15 MED ORDER — 0.9 % SODIUM CHLORIDE (POUR BTL) OPTIME
TOPICAL | Status: DC | PRN
  Administered 2015-11-15 (×4): 1000 mL

## 2015-11-15 MED ORDER — MIDAZOLAM HCL 5 MG/5ML IJ SOLN
INTRAMUSCULAR | Status: DC | PRN
  Administered 2015-11-15: 4 mg via INTRAVENOUS

## 2015-11-15 MED ORDER — ROCURONIUM BROMIDE 50 MG/5ML IV SOLN
INTRAVENOUS | Status: AC
  Filled 2015-11-15: qty 1

## 2015-11-15 SURGICAL SUPPLY — 47 items
BLADE SURG ROTATE 9660 (MISCELLANEOUS) IMPLANT
BNDG GAUZE ELAST 4 BULKY (GAUZE/BANDAGES/DRESSINGS) ×2 IMPLANT
CANISTER SUCTION 2500CC (MISCELLANEOUS) ×3 IMPLANT
COVER SURGICAL LIGHT HANDLE (MISCELLANEOUS) ×3 IMPLANT
DRAPE LAPAROSCOPIC ABDOMINAL (DRAPES) ×3 IMPLANT
DRAPE WARM FLUID 44X44 (DRAPE) ×3 IMPLANT
DRSG OPSITE POSTOP 4X10 (GAUZE/BANDAGES/DRESSINGS) IMPLANT
DRSG OPSITE POSTOP 4X8 (GAUZE/BANDAGES/DRESSINGS) IMPLANT
DRSG PAD ABDOMINAL 8X10 ST (GAUZE/BANDAGES/DRESSINGS) ×2 IMPLANT
ELECT BLADE 6.5 EXT (BLADE) IMPLANT
ELECT CAUTERY BLADE 6.4 (BLADE) ×3 IMPLANT
ELECT REM PT RETURN 9FT ADLT (ELECTROSURGICAL) ×3
ELECTRODE REM PT RTRN 9FT ADLT (ELECTROSURGICAL) ×1 IMPLANT
GAUZE SPONGE 4X4 12PLY STRL (GAUZE/BANDAGES/DRESSINGS) ×2 IMPLANT
GLOVE BIOGEL PI IND STRL 7.5 (GLOVE) IMPLANT
GLOVE BIOGEL PI IND STRL 8 (GLOVE) IMPLANT
GLOVE BIOGEL PI INDICATOR 7.5 (GLOVE) ×2
GLOVE BIOGEL PI INDICATOR 8 (GLOVE) ×4
GLOVE SURG SIGNA 7.5 PF LTX (GLOVE) ×3 IMPLANT
GOWN STRL REUS W/ TWL LRG LVL3 (GOWN DISPOSABLE) ×1 IMPLANT
GOWN STRL REUS W/ TWL XL LVL3 (GOWN DISPOSABLE) ×1 IMPLANT
GOWN STRL REUS W/TWL LRG LVL3 (GOWN DISPOSABLE)
GOWN STRL REUS W/TWL XL LVL3 (GOWN DISPOSABLE) ×9
KIT BASIN OR (CUSTOM PROCEDURE TRAY) ×3 IMPLANT
KIT ROOM TURNOVER OR (KITS) ×3 IMPLANT
LIGASURE IMPACT 36 18CM CVD LR (INSTRUMENTS) IMPLANT
NS IRRIG 1000ML POUR BTL (IV SOLUTION) ×6 IMPLANT
PACK GENERAL/GYN (CUSTOM PROCEDURE TRAY) ×3 IMPLANT
PAD ARMBOARD 7.5X6 YLW CONV (MISCELLANEOUS) ×3 IMPLANT
RELOAD PROXIMATE 75MM BLUE (ENDOMECHANICALS) ×6 IMPLANT
RELOAD STAPLE 75 3.8 BLU REG (ENDOMECHANICALS) IMPLANT
SPECIMEN JAR LARGE (MISCELLANEOUS) IMPLANT
SPONGE LAP 18X18 X RAY DECT (DISPOSABLE) IMPLANT
STAPLER GUN LINEAR PROX 60 (STAPLE) ×2 IMPLANT
STAPLER PROXIMATE 75MM BLUE (STAPLE) ×2 IMPLANT
STAPLER VISISTAT 35W (STAPLE) ×3 IMPLANT
SUCTION POOLE TIP (SUCTIONS) ×3 IMPLANT
SUT PDS AB 1 TP1 96 (SUTURE) ×6 IMPLANT
SUT SILK 2 0 SH CR/8 (SUTURE) ×3 IMPLANT
SUT SILK 2 0 TIES 10X30 (SUTURE) ×3 IMPLANT
SUT SILK 3 0 SH CR/8 (SUTURE) ×3 IMPLANT
SUT SILK 3 0 TIES 10X30 (SUTURE) ×3 IMPLANT
SUT VIC AB 3-0 SH 18 (SUTURE) ×2 IMPLANT
TOWEL OR 17X24 6PK STRL BLUE (TOWEL DISPOSABLE) ×3 IMPLANT
TOWEL OR 17X26 10 PK STRL BLUE (TOWEL DISPOSABLE) ×3 IMPLANT
TRAY FOLEY CATH 16FRSI W/METER (SET/KITS/TRAYS/PACK) IMPLANT
YANKAUER SUCT BULB TIP NO VENT (SUCTIONS) ×2 IMPLANT

## 2015-11-15 NOTE — Progress Notes (Signed)
Pt is sleeping comfortably at this time no distress noted.

## 2015-11-15 NOTE — Progress Notes (Signed)
PULMONARY / CRITICAL CARE MEDICINE   Name: Casey Pennington MRN: [id number] DOB: 1955/09/24    ADMISSION DATE:  11/09/2015  REFERRING MD:  ER  CHIEF COMPLAINT:  Altered mental status  SUBJECTIVE: Patient was taken to the operating room again today. Transient desaturation during surgery with a now closed abdomen. No other acute events overnight.  REVIEW OF SYSTEMS: Unable to obtain as patient is intubated and sedated.   VITAL SIGNS: BP 112/80 mmHg  Pulse 78  Temp(Src) 97.5 F (36.4 C) (Oral)  Resp 20  Ht _0  (1.753 m)  Wt 146 lb 2.6 oz (66.3 kg)  BMI 21.57 kg/m2  SpO2 96%  HEMODYNAMICS: CVP:  [5 mmHg-58 mmHg] 6 mmHg  VENTILATOR SETTINGS: Vent Mode:  [-] PRVC FiO2 (%):  [50 %-60 %] 60 % Set Rate:  [20 bmp] 20 bmp Vt Set:  [570 mL] 570 mL PEEP:  [5 cmH20] 5 cmH20 Pressure Support:  [10 cmH20] 10 cmH20 Plateau Pressure:  [17 cmH20-19 cmH20] 18 cmH20  INTAKE / OUTPUT: I/O last 3 completed shifts: In: 3141.7 [I.V.:1901.7; Blood:430; Other:50; NG/GT:110; IV Piggyback:650] Out: 1829 [Urine:93; Emesis/NG output:600; Drains:1150; HBZJI:9678; Blood:20]  PHYSICAL EXAMINATION: General: Sedated. No distress. Family at bedside.  Integument:  Warm & dry. No rash on exposed skin. Abdominal dressing clean and dry. HEENT:  Endotracheal tube in place. Pupils equal. No scleral icterus.  Cardiovascular:  Regular rate. Normal S1 & S2 No appreciable JVD.  Pulmonary:  Coarse breath sounds bilaterally. Symmetric chest wall rise on ventilator.  Abdomen: Soft. hypoactive bowel sounds. Abdominal dressing in place. Musculoskeletal:  Normal bulk. No joint deformity or effusion appreciated.  LABS:  BMET  Recent Labs Lab 11/14/15 0325 11/14/15 1527 11/18/2015 0410  NA 138 136 137  K 4.7 4.2 4.6  CL 102 105 105  CO2 _1 BUN 35* 28* 24*  CREATININE 2.02* 1.77* 1.48*  GLUCOSE 111* 131* 102*    Electrolytes  Recent Labs Lab 11/21/2015 0200  11/14/15 0324 11/14/15 0325  11/14/15 1527 11/12/2015 0410  CALCIUM 6.6*  < >  --  7.4* 7.4* 7.4*  MG 2.1  --  2.4  --   --  2.4  PHOS 3.8  < >  --  3.2 3.5 2.7  < > = values in this interval not displayed.  CBC  Recent Labs Lab 11/12/2015 0200 11/14/15 0324 11/21/2015 0410  WBC 6.9 6.9 5.7  HGB 10.1* 10.0* 9.6*  HCT 31.6* 31.9* 30.0*  PLT 25* 45* PENDING    Coag's  Recent Labs Lab 10/31/2015 0515 11/12/15 1341 11/14/15 0324  INR 2.22* 2.65* 1.38    Sepsis Markers  Recent Labs Lab 11/09/2015 2045 11/08/2015 2115 11/09/2015 0535  LATICACIDVEN  --  2.2* 2.6*  PROCALCITON 1.69  --   --     ABG  Recent Labs Lab 11/14/2015 1953 10/25/2015 0159 11/14/15 0305  PHART 7.310* 7.445 7.444  PCO2ART 49.7* 37.0 35.7  PO2ART 94.0 94.0 56.0*    Liver Enzymes  Recent Labs Lab 11/06/2015 0515 11/12/15 1145  11/14/15 0324 11/14/15 0325 11/14/15 1527 11/07/2015 0410  AST 1191* 725*  --  500*  --   --   --   ALT 372* 418*  --  491*  --   --   --   ALKPHOS 97 69  --  87  --   --   --   BILITOT 5.7* 16.2*  --  12.7*  --   --   --   ALBUMIN  2.7* 2.2*  < > 2.1* 2.1* 2.1* 2.0*  < > = values in this interval not displayed.  Cardiac Enzymes  Recent Labs Lab 11/09/2015 2045 11/04/2015 0915 11/02/2015 1530  TROPONINI 8.53* 25.27* 27.18*    Glucose  Recent Labs Lab 11/14/15 0725 11/14/15 1150 11/14/15 1543 11/14/15 2012 11/14/2015 0007 11/12/2015 0420  GLUCAP 103* 107* 124* 98 96 95    Imaging No results found.   STUDIES:  5/17 CT abd/pelvis >> pneumatosis SB 5/18 Echo >> EF 55 to 17%, grade 1 diastolic CHF, PAS 47 mmHg 5/22 Port CXR:  Improving right basilar opacity. No new opacity. Lines unchanged in position. 5/22 Port CXR: Endotracheal tube in good position. No new focal opacity or effusion appreciated.   CULTURES: 5/17 Urine >> E coli 5/17 Blood >> MRSA   ANTIBIOTICS: 5/17 Vancomycin >> 5/17 Zosyn >> 5/22 Diflucan >>  SIGNIFICANT EVENTS: 5/17 Admit, surgery consulted 5/19 ID consulted,  renal consulted >> start CRRT  LINES/TUBES: 5/17 ETT >>  5/18 Lt IJ CVL >> 5/18 Lt femoral a line >> 5/19 Rt IJ HD cath >> 5/19 Lt [place] CVL >>  OGT 5/17 >> Foley 5/17 >>  ASSESSMENT / PLAN:  PULMONARY A: Acute Hypoxic Respiratory Failure  P:   Plan to resume weaning FiO2 for saturation greater than 94%  Goal net negative fluid balance   CARDIOVASCULAR A:  NSTEMI. Septic shock. H/O Pulmonary HTN  P:  Levophed to maintain MAP >65 Continue monitoring on telemetry  RENAL A:   Acute Renal Failure  P:   Nephrology following Continuing CRRT Trending electrolytes per Nephrology  GASTROINTESTINAL A:   Ischemic Small Bowel - S/P Laparotomy. Protein calorie malnutrition. Hep C with cirrhosis.  P:   NPO Protonix Post op care per CCS  HEMATOLOGIC A:   Coagulopathy Anemia - Hgb Stable. Thrombocytopenia - Likely combination of sepsis & hepatic cirrhosis.  P:  Trending Cell Counts daily w/ CBC Trending Coags daily SCDs  INFECTIOUS A:   Septic Shock MRSA bacteremia. E coli UTI Peritonitis H/O HIV & Hep C - CD4 120 on 11/18/2015.  P:   Infectious Diseases Following  ENDOCRINE A:   Hyperglycemia - Possibly secondary to infection & steroids. Possibl Adrenal Insufficiency  P:   Accu-Checks q4hr Low dose SSI per algorithm Hydrocortisone 54m IV q6hr  NEUROLOGIC A:   Acute Encephalopathy - Likely metabolic.  P:   RASS Goal: -1 Fentanyl gtt & IV prn Versed IV prn  TODAY'S SUMMARY:  60yo male with h/o IVDA, HIV, Hep C with cirrhosis, Pulmonary HTN. Now with ischemic SB with peritonitis, septic shock NSTEMI, AKI, VDRF, MRSA bacteremia after binge of illicit substances. Patient now has a closed abdomen. Transient desaturation during surgery without new finding on chest x-ray imaging. Proceeding with weaning of FiO2. Diuresing w/ CRRT.  I have spent a total of 33 minutes of critical care time today caring for the patient, updating the patient's family at  bedside, and reviewing the patient's electronic medical record.   JSonia BallerNAshok Cordia M.D. [facility] Pager:  [phone] 3pm or if no response, call [phone] 11/24/2015, 6:38 AM

## 2015-11-15 NOTE — Progress Notes (Signed)
Patient ID: Casey Pennington, male   DOB: 29-Aug-1955, 60 y.o.   MRN: [id number]   Pt remains on vent Will return to OR today for another look at the bowel.  Will hopefully be able to do an anastomosis and close the abdomen today. Risks have been discussed multiple times with patient's mother.

## 2015-11-15 NOTE — Transfer of Care (Signed)
Immediate Anesthesia Transfer of Care Note  Patient: Casey Pennington  Procedure(s) Performed: Procedure(s): EXPLORATORY LAPAROTOMY with closure of abdomen (N/A)  Patient Location: ICU  Anesthesia Type:General  Level of Consciousness: Patient remains intubated per anesthesia plan  Airway & Oxygen Therapy: Patient remains intubated per anesthesia plan and Patient placed on Ventilator (see vital sign flow sheet for setting)  Post-op Assessment: Report given to RN and Post -op Vital signs reviewed and stable  Post vital signs: Reviewed and stable  Last Vitals:  Filed Vitals:   11/07/2015 0828 11/14/2015 0900  BP:  114/81  Pulse:  78  Temp: 36.6 C   Resp:  22    Last Pain:  Filed Vitals:   10/31/2015 1059  PainSc: Asleep         Complications: No apparent anesthesia complications

## 2015-11-15 NOTE — Progress Notes (Signed)
Pharmacy Antibiotic Note  Casey Pennington is a 60 y.o. male admitted on 11/16/2015 with bacteremia.   Plan: Continue vancomycin 750/24h Continue zosyn 2.25 gm IV q6h and fluconazole to 425m IV q24 Adjust abx when off CRRT  Height: _0  (175.3 cm) Weight: 146 lb 2.6 oz (66.3 kg) IBW/kg (Calculated) : 70.7  Temp (24hrs), Avg:97.5 F (36.4 C), Min:97.3 F (36.3 C), Max:97.8 F (36.6 C)   Recent Labs Lab 11/03/2015 2115  11/17/2015 0535 11/01/2015 1530 11/12/15 0548 11/12/15 0838  11/24/2015 0200 10/31/2015 1312 11/14/15 0324 11/14/15 0325 11/14/15 1527 10/25/2015 0410  WBC  --   < >  --  6.5 13.9*  --   --  6.9  --  6.9  --   --  5.7  CREATININE  --   < >  --  4.17* 5.48*  --   < > 3.64* 2.81*  --  2.02* 1.77* 1.48*  LATICACIDVEN 2.2*  --  2.6*  --   --   --   --   --   --   --   --   --   --   VANCORANDOM  --   --   --   --   --  12  --   --   --   --   --   --   --   < > = values in this interval not displayed.  Estimated Creatinine Clearance: 51 mL/min (by C-G formula based on Cr of 1.48).    No Known Allergies   Antimicrobials this admission: 5/17 Vanc >> 5/17 Zosyn >> 5/18 Diflucan >>  Dose adjustments this admission: 5/19 VR: 12  Microbiology results: 5/17 BCx: 2/2 mrsa 5/17 UCx: ecoli 5/18 MRSA PCR:   MLevester Fresh PharmD, BCPS, BNorth River Surgery CenterClinical Pharmacist Pager [phone]/22/2017 2:17 PM

## 2015-11-15 NOTE — Op Note (Signed)
EXPLORATORY LAPAROTOMY with closure of abdomen  Procedure Note  Casey Pennington 11/02/2015 - 11/20/2015   Pre-op Diagnosis: Open Abdomen     Post-op Diagnosis: same  Procedure(s): EXPLORATORY LAPAROTOMY  ILEOCOLIC ANASTOMOSIS CLOSURE OF ABDOMEN  Surgeon(s): Coralie Keens, MD Georganna Skeans, MD  Anesthesia: General  Staff:  Circulator: Rosanne Sack, RN Scrub Person: Adella Hare Circulator Assistant: Quincy Carnes, RN  Estimated Blood Loss: Minimal                         Casey Pennington   Date: 10/31/2015  Time: 10:30 AM

## 2015-11-15 NOTE — Progress Notes (Signed)
[facility]ious [facility]   Reason for visit: Follow up on sepsis, HIV  Interval History: s/p repeat xlap this morning, was able to undergo ileocolic anastamosis and abdominal fascia closure  S: afebrile, off of levophed    Physical Exam: Constitutional:  Filed Vitals:   11/18/2015 1500 10/27/2015 1507  BP: 97/72   Pulse: 66   Temp:  96.8 F (36 C)  Resp: 21    patient appears in NAD Eyes: icterus HENT: ET in place; left IJ, left subclavian, right dialysis cath Respiratory: Normal respiratory effort; CTA B, anterior exam Cardiovascular: tachy RR GI: absent bowel sounds  Review of Systems: Unable to be assessed due to mental status  Lab Results  Component Value Date   WBC 5.7 11/09/2015   HGB 9.6* 11/08/2015   HCT 30.0* 11/24/2015   MCV 98.0 11/06/2015   PLT 28* 10/31/2015    Lab Results  Component Value Date   CREATININE 1.48* 10/25/2015   BUN 24* 11/12/2015   NA 137 11/16/2015   K 4.6 10/30/2015   CL 105 11/16/2015   CO2 24 10/27/2015    Lab Results  Component Value Date   ALT 491* 11/14/2015   AST 500* 11/14/2015   ALKPHOS 87 11/14/2015     Microbiology: Recent Results (from the past 240 hour(s))  Urine culture     Status: Abnormal   Collection Time: 10/25/2015  5:20 PM  Result Value Ref Range Status   Specimen Description URINE, CATHETERIZED  Final   Special Requests NONE  Final   Culture >=100,000 COLONIES/mL ESCHERICHIA COLI (A)  Final   Report Status 11/05/2015 FINAL  Final   Organism ID, Bacteria ESCHERICHIA COLI (A)  Final      Susceptibility   Escherichia coli - MIC*    AMPICILLIN <=2 SENSITIVE Sensitive     CEFAZOLIN <=4 SENSITIVE Sensitive     CEFTRIAXONE <=1 SENSITIVE Sensitive     CIPROFLOXACIN <=0.25 SENSITIVE Sensitive     GENTAMICIN <=1 SENSITIVE Sensitive     IMIPENEM <=0.25 SENSITIVE Sensitive     NITROFURANTOIN <=16 SENSITIVE Sensitive     TRIMETH/SULFA <=20 SENSITIVE Sensitive     AMPICILLIN/SULBACTAM <=2  SENSITIVE Sensitive     PIP/TAZO <=4 SENSITIVE Sensitive     * >=100,000 COLONIES/mL ESCHERICHIA COLI  Blood culture (routine x 2)     Status: Abnormal   Collection Time: 11/08/2015  8:45 PM  Result Value Ref Range Status   Specimen Description BLOOD RIGHT ARM  Final   Special Requests BOTTLES DRAWN AEROBIC AND ANAEROBIC 6CC  Final   Culture  Setup Time   Final    GRAM POSITIVE COCCI IN CLUSTERS RECOVERED FROM BOTH BOTTLES. Gram Stain Report Called to,Read Back By and Verified With: HARVEY,T. [facility]) AT 0600 ON 10/26/2015 BY BAUGHAM,M. Performed at [facility]    Culture (A)  Final    STAPHYLOCOCCUS AUREUS SUSCEPTIBILITIES PERFORMED ON PREVIOUS CULTURE WITHIN THE LAST 5 DAYS. Performed at [facility]    Report Status 10/26/2015 FINAL  Final  Blood culture (routine x 2)     Status: Abnormal   Collection Time: 11/21/2015  9:15 PM  Result Value Ref Range Status   Specimen Description BLOOD LEFT ARM  Final   Special Requests BOTTLES DRAWN AEROBIC AND ANAEROBIC 6CC  Final   Culture  Setup Time   Final    GRAM POSITIVE COCCI IN CLUSTERS RECOVERED [place] Gram Stain Report Called to,Read Back By and Verified With:  HARVEY,T [facility] [place]) AT 8921 ON 11/08/2015 BY BAUGHAM,M. Performed at [place] ID to follow CRITICAL RESULT CALLED TO, READ BACK BY AND VERIFIED WITH: R RUMBARGAR PHARMD 1800 10/31/2015 A BROWNING Performed at [place] (A)  Final   Report Status 10/28/2015 FINAL  Final   Organism ID, Bacteria METHICILLIN RESISTANT STAPHYLOCOCCUS AUREUS  Final      Susceptibility   Methicillin resistant staphylococcus aureus - MIC*    CIPROFLOXACIN >=8 RESISTANT Resistant     ERYTHROMYCIN >=8 RESISTANT Resistant     GENTAMICIN <=0.5 SENSITIVE Sensitive     OXACILLIN >=4 RESISTANT Resistant     TETRACYCLINE <=1 SENSITIVE Sensitive     VANCOMYCIN 1 SENSITIVE Sensitive     TRIMETH/SULFA  <=10 SENSITIVE Sensitive     CLINDAMYCIN <=0.25 SENSITIVE Sensitive     RIFAMPIN <=0.5 SENSITIVE Sensitive     Inducible Clindamycin NEGATIVE Sensitive     * METHICILLIN RESISTANT STAPHYLOCOCCUS AUREUS  Blood Culture ID Panel (Reflexed)     Status: Abnormal   Collection Time: 10/25/2015  9:15 PM  Result Value Ref Range Status   Enterococcus species NOT DETECTED NOT DETECTED Final   Vancomycin resistance NOT DETECTED NOT DETECTED Final   Listeria monocytogenes NOT DETECTED NOT DETECTED Final   Staphylococcus species NOT DETECTED NOT DETECTED Final   Staphylococcus aureus DETECTED (A) NOT DETECTED Final    Comment: CRITICAL RESULT CALLED TO, READ BACK BY AND VERIFIED WITH: R RUMBARGAR PHARMD 1800 11/12/2015 A BROWNING    Methicillin resistance DETECTED (A) NOT DETECTED Final    Comment: CRITICAL RESULT CALLED TO, READ BACK BY AND VERIFIED WITH: R RUMBARGAR PHARMD 1800 11/06/2015 A BROWNING    Streptococcus species NOT DETECTED NOT DETECTED Final   Streptococcus agalactiae NOT DETECTED NOT DETECTED Final   Streptococcus pneumoniae NOT DETECTED NOT DETECTED Final   Streptococcus pyogenes NOT DETECTED NOT DETECTED Final   Acinetobacter baumannii NOT DETECTED NOT DETECTED Final   Enterobacteriaceae species NOT DETECTED NOT DETECTED Final   Enterobacter cloacae complex NOT DETECTED NOT DETECTED Final   Escherichia coli NOT DETECTED NOT DETECTED Final   Klebsiella oxytoca NOT DETECTED NOT DETECTED Final   Klebsiella pneumoniae NOT DETECTED NOT DETECTED Final   Proteus species NOT DETECTED NOT DETECTED Final   Serratia marcescens NOT DETECTED NOT DETECTED Final   Carbapenem resistance NOT DETECTED NOT DETECTED Final   Haemophilus influenzae NOT DETECTED NOT DETECTED Final   Neisseria meningitidis NOT DETECTED NOT DETECTED Final   Pseudomonas aeruginosa NOT DETECTED NOT DETECTED Final   Candida albicans NOT DETECTED NOT DETECTED Final   Candida glabrata NOT DETECTED NOT DETECTED Final   Candida  krusei NOT DETECTED NOT DETECTED Final   Candida parapsilosis NOT DETECTED NOT DETECTED Final   Candida tropicalis NOT DETECTED NOT DETECTED Final    Comment: Performed at [facility]  MRSA PCR Screening     Status: Abnormal   Collection Time: 11/03/2015  6:08 AM  Result Value Ref Range Status   MRSA by PCR POSITIVE (A) NEGATIVE Final    Comment:        The GeneXpert MRSA Assay (FDA approved for NASAL specimens only), is one component of a comprehensive MRSA colonization surveillance program. It is not intended to diagnose MRSA infection nor to guide or monitor treatment for MRSA infections. RESULT CALLED TO, READ BACK BY AND VERIFIED WITH: Carma Leaven AT 1941 10/28/2015 BY K BARR   Culture, blood (routine  x 2)     Status: None (Preliminary result)   Collection Time: 11/14/15  5:30 AM  Result Value Ref Range Status   Specimen Description BLOOD LEFT ANTECUBITAL  Final   Special Requests BOTTLES DRAWN AEROBIC ONLY 3CC  Final   Culture NO GROWTH 1 DAY  Final   Report Status PENDING  Incomplete  Culture, blood (routine x 2)     Status: None (Preliminary result)   Collection Time: 11/14/15  6:35 AM  Result Value Ref Range Status   Specimen Description BLOOD RIGHT ARM  Final   Special Requests BOTTLES DRAWN AEROBIC ONLY 5CC  Final   Culture NO GROWTH 1 DAY  Final   Report Status PENDING  Incomplete  Surgical pcr screen     Status: Abnormal   Collection Time: 11/14/15  8:06 PM  Result Value Ref Range Status   MRSA, PCR POSITIVE (A) NEGATIVE Final    Comment: RESULT CALLED TO, READ BACK BY AND VERIFIED WITH: MSteffanie Dunn AT 0747 ON [id number] BY S. YARBROUGH    Staphylococcus aureus POSITIVE (A) NEGATIVE Final    Comment:        The Xpert SA Assay (FDA approved for NASAL specimens in patients over 34 years of age), is one component of a comprehensive surveillance program.  Test performance has been validated by [facility] for patients greater than or equal to 26 year  old. It is not intended to diagnose infection nor to guide or monitor treatment.     Impression/Plan:  1. Sepsis - MRSA bacteremia - blood cultures from 5/21 are no growth to date. Continue on vancomycin 2.  Ischemic bowel with perforation = continue on zosyn  3.  HIV - will defer starting ARVs for now until he better recovers, able to take po.  4. 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  Resultant thrombocytopenia. Also now jaundiced with Tbili at 8, recommend to repeat cmp tomrrow 5. transaminitis = likely due to sepsis, hypotension plus cirrhosis. Will continue to follow cmp, continues to trend down. On admit, ast/alt in 1500s, now down to 500 6.  Hepatitis C - reportedly treated in jail. Viral load sent.  7. Thrombocytopenia - cirrhosis and acute sepsis.  8. aki = continue on CRRT

## 2015-11-15 NOTE — Anesthesia Preprocedure Evaluation (Signed)
Anesthesia Evaluation  Patient identified by MRN, date of birth, ID band Patient unresponsive    Reviewed: Unable to perform ROS - Chart review only  Airway Mallampati: Intubated       Dental  (+) Poor Dentition   Pulmonary Current Smoker,       + intubated    Cardiovascular hypertension,  Rhythm:Regular     Neuro/Psych    GI/Hepatic (+) Hepatitis -, C  Endo/Other    Renal/GU DialysisRenal disease     Musculoskeletal   Abdominal   Peds  Hematology  (+) anemia , HIV,   Anesthesia Other Findings   Reproductive/Obstetrics                             Anesthesia Physical  Anesthesia Plan  ASA: IV  Anesthesia Plan: General   Post-op Pain Management:    Induction: Inhalational  Airway Management Planned: Oral ETT  Additional Equipment:   Intra-op Plan:   Post-operative Plan: Post-operative intubation/ventilation  Informed Consent:   Plan Discussed with: CRNA  Anesthesia Plan Comments:         Anesthesia Quick Evaluation

## 2015-11-15 NOTE — Op Note (Signed)
[name], [name] NO.:  [id number]  MEDICAL RECORD NO.:  [id number]  LOCATION:  2M02C                        FACILITY:  [facility]  PHYSICIAN:  Coralie Keens, M.D. DATE OF BIRTH:  1955/08/07  DATE OF PROCEDURE:  11/24/2015 DATE OF DISCHARGE:                              OPERATIVE REPORT   PREOPERATIVE DIAGNOSIS:  Open abdomen.  POSTOPERATIVE DIAGNOSIS:  Open abdomen.  PROCEDURES: 1. Exploratory laparotomy. 2. Ileocolic anastomosis. 3. Closure of abdominal fascia.  SURGEON:  Coralie Keens, M.D.  ASSISTANT:  Merri Ray. Grandville Silos, M.D.  ANESTHESIA:  General endotracheal anesthesia.  ESTIMATED BLOOD LOSS:  Minimal.  INDICATIONS:  This is a 60 year old gentleman, who underwent exploratory laparotomy on Thursday for pneumatosis and was found to have ischemic small bowel.  He was left open with a wound VAC in place.  He returns to the operating room on Saturday and had further bowel resected.  He now presents for re-exploration findings.  The patient was found to have no further ischemic bowel.  I was able to perform an ileocolic anastomosis and close this fascia.  PROCEDURE IN DETAIL:  The patient was brought to the operating room, identified as Casey Pennington.  He was already on a ventilator from the Intensive Care Unit.  He was placed supine on the operating table where general anesthesia was induced.  His wound VAC was removed.  His abdomen was then prepped and draped in usual sterile fashion.  I then removed the inner wound VAC.  I eviscerated the small intestine which ran from ligament of Treitz to the terminal ileum and saw no further gross ischemia.  There were minimal ischemic patches in small areas.  I evaluated the cecum and right colon which also appeared to be viable. At this point, I reapproximated the small bowel to the colon in a side- to-side fashion with silk sutures.  I performed a colotomy and an enterotomy and then performed a  side-to-side anastomosis of the ileum to the colon with the GIA 75 stapler.  I then closed the opening with a TA- 60 stapler.  A wide anastomosis appeared to be achieved.  I then reinforced the staple line with interrupted 3-0 silk sutures.  I then closed the mesenteric defect with silk sutures as well.  The abdomen was then irrigated with normal saline.  Hemostasis appeared to be achieved. I then closed the patient's midline fascia with a running #1 looped PDS suture.  I then irrigated the skin and placed a Kerlix wet-to-dry dressing into the wound. This was covered with dry gauze.  The patient tolerated the procedure well.  All counts were correct at the end of the procedure.  The patient was then left intubated and taken in a guarded condition from the operating room back to the intensive care unit.     Coralie Keens, M.D.     DB/MEDQ  D:  10/28/2015  T:  11/06/2015  Job:  [id number]

## 2015-11-15 NOTE — Progress Notes (Signed)
Patient ID: Casey Pennington, male   DOB: 12-28-1955, 60 y.o.   MRN: [id number]  [facility] Progress Note    Assessment/ Plan:   1. AKI: Anuric and multifactorial secondary to rhabdomyolysis, septic shock in the setting of HCV cirrhosis (+/- HRS). Continue CRRT given the absence of any renal recovery and the need for regulation of volume status/catabolic status and multiple metabolic abnormalities. Based on his labs, no change to CRRT prescription. With his current volume status and pressor dependent hypotension, maintain ultrafiltration at -50 mL per hour. 2. Ischemic bowel: Status post exploratory surgery/bowel resection 2 days ago and is planned for a relook surgery today with anastomosis/possible abdominal closure. 3. Hypocalcemia: Calcium 7.4, albumin 2.0 (corrected calcium appears to be appropriate at 9.0). Check ionized calcium. 4. MRSA sepsis: On broad-spectrum antibiotic therapy, further management per CCM/ID. 5. Cirrhosis/hepatitis C/thrombocytopenia: With historical report of hepatitis C treated while incarcerated-viral load sent. Unclear whether thrombocytopenia is from splenic sequestration/portal hypertension versus sepsis.  Subjective:   Intermittent agitation noted overnight-remains on pressors. Tolerating ultrafiltration of -50 mL/hr.    Objective:   BP 97/66 mmHg  Pulse 69  Temp(Src) 97.5 F (36.4 C) (Oral)  Resp 20  Ht _0  (1.753 m)  Wt 66.3 kg (146 lb 2.6 oz)  BMI 21.57 kg/m2  SpO2 98%  Intake/Output Summary (Last 24 hours) at 11/09/2015 0737 Last data filed at 10/30/2015 0700  Gross per 24 hour  Intake 1630.61 ml  Output   3468 ml  Net -1837.39 ml   Weight change: -3.1 kg (-6 lb 13.4 oz)  Physical Exam: [id number], awake, not clearly responsive to verbal commands CVS: Pulse regular rhythm, normal rate, S1-S2 with ESM Resp: Coarse breath sounds bilaterally-no rales/rhonchi Abd: Soft, flat, abdominal wound open with negative pressure  dressing Ext: Trace lower extremity edema  Imaging: Dg Chest Port 1 View  11/01/2015  CLINICAL DATA:  Respiratory failure. EXAM: PORTABLE CHEST 1 VIEW COMPARISON:  11/14/2015. FINDINGS: Endotracheal tube, bilateral IJ lines,, left subclavian line, NG tube in stable position. Right base atelectasis and or infiltrates slight improvement from prior exam. Cardiomegaly with prominent central pulmonary arteries suggesting pulmonary hypertension. No CHF. No pleural effusion or pneumothorax. IMPRESSION: 1. Lines and tubes in stable position. 2. Mild right base subsegmental atelectasis and or infiltrate, slight improvement from prior exam. 3. Cardiomegaly with prominent central pulmonary arteries suggesting pulmonary hypertension. No CHF. Electronically Signed   By: Marcello Moores  Register   On: 11/23/2015 07:10   Dg Chest Port 1 View  11/14/2015  CLINICAL DATA:  Respiratory failure EXAM: PORTABLE CHEST 1 VIEW COMPARISON:  Nov 13, 2015 FINDINGS: Stable support apparatus. The cardiomediastinal silhouette is stable. No pneumothorax. Mild opacity in the medial right lung base has improved. No other interval changes. IMPRESSION: No interval changes.  Stable support apparatus. Electronically Signed   By: Dorise Bullion III M.D   On: 11/14/2015 10:03    Labs: BMET  Recent Labs Lab 11/06/2015 0515  11/12/15 0548 11/12/15 1600 11/21/2015 0200 10/30/2015 1312 11/14/15 0325 11/14/15 1527 11/24/2015 0410  NA 144  < > 145 146* 142 140 138 136 137  K 5.3*  < > 4.8 4.7 4.2 4.6 4.7 4.2 4.6  CL 112*  < > 101 102 104 105 102 105 105  CO2 16*  < > _1 GLUCOSE 94  < > 109* 127* 122* 115* 111* 131* 102*  BUN 70*  < > 105* 120* 73* 54* 35* 28*  24*  CREATININE 3.86*  < > 5.48* 6.00* 3.64* 2.81* 2.02* 1.77* 1.48*  CALCIUM 6.9*  < > 6.2* 6.0* 6.6* 6.9* 7.4* 7.4* 7.4*  PHOS 7.7*  --   --  6.5* 3.8 4.3 3.2 3.5 2.7  < > = values in this interval not displayed. CBC  Recent Labs Lab 11/24/2015 0515  11/12/15 0548  11/01/2015 0200 11/14/15 0324 11/18/2015 0410  WBC 12.2*  < > 13.9* 6.9 6.9 5.7  NEUTROABS 10.9*  --   --  6.1  --   --   HGB 15.5  < > 12.3* 10.1* 10.0* 9.6*  HCT 47.1  < > 37.2* 31.6* 31.9* 30.0*  MCV 101.5*  < > 98.7 99.4 98.8 98.0  PLT 88*  < > 38* 25* 45* 28*  < > = values in this interval not displayed.  Medications:    . antiseptic oral rinse  7 mL Mouth Rinse QID  . chlorhexidine gluconate (SAGE KIT)  15 mL Mouth Rinse BID  . fluconazole (DIFLUCAN) IV  400 mg Intravenous Q0600  . hydrocortisone sod succinate (SOLU-CORTEF) inj  50 mg Intravenous Q6H  . insulin aspart  0-9 Units Subcutaneous Q4H  . mupirocin ointment  1 application Nasal BID  . pantoprazole (PROTONIX) IV  40 mg Intravenous QHS  . piperacillin-tazobactam (ZOSYN)  IV  2.25 g Intravenous Q6H  . sodium chloride flush  10-40 mL Intravenous Q12H  . sodium chloride flush  10-40 mL Intracatheter Q12H  . vancomycin  750 mg Intravenous Q24H   Elmarie Shiley, MD 11/21/2015, 7:37 AM

## 2015-11-16 ENCOUNTER — Encounter (HOSPITAL_COMMUNITY): Payer: Self-pay | Admitting: General Surgery

## 2015-11-16 DIAGNOSIS — I272 Other secondary pulmonary hypertension: Secondary | ICD-10-CM

## 2015-11-16 DIAGNOSIS — K7030 Alcoholic cirrhosis of liver without ascites: Secondary | ICD-10-CM

## 2015-11-16 LAB — MAGNESIUM: Magnesium: 2.5 mg/dL — ABNORMAL HIGH (ref 1.7–2.4)

## 2015-11-16 LAB — GLUCOSE, CAPILLARY
GLUCOSE-CAPILLARY: 125 mg/dL — AB (ref 65–99)
GLUCOSE-CAPILLARY: 109 mg/dL — AB (ref 65–99)
Glucose-Capillary: 108 mg/dL — ABNORMAL HIGH (ref 65–99)
Glucose-Capillary: 92 mg/dL (ref 65–99)
Glucose-Capillary: 98 mg/dL (ref 65–99)

## 2015-11-16 LAB — RENAL FUNCTION PANEL
ALBUMIN: 2.1 g/dL — AB (ref 3.5–5.0)
ALBUMIN: 2.1 g/dL — AB (ref 3.5–5.0)
Anion gap: 8 (ref 5–15)
Anion gap: 8 (ref 5–15)
BUN: 21 mg/dL — ABNORMAL HIGH (ref 6–20)
BUN: 20 mg/dL (ref 6–20)
CALCIUM: 7.6 mg/dL — AB (ref 8.9–10.3)
CALCIUM: 7.5 mg/dL — AB (ref 8.9–10.3)
CO2: 24 mmol/L (ref 22–32)
CO2: 24 mmol/L (ref 22–32)
CREATININE: 1.16 mg/dL (ref 0.61–1.24)
Chloride: 106 mmol/L (ref 101–111)
Chloride: 106 mmol/L (ref 101–111)
Creatinine, Ser: 1.18 mg/dL (ref 0.61–1.24)
GFR calc Af Amer: >60 mL/min (ref >60)
GFR calc non Af Amer: >60 mL/min (ref >60)
GLUCOSE: 114 mg/dL — AB (ref 65–99)
Glucose, Bld: 103 mg/dL — ABNORMAL HIGH (ref 65–99)
PHOSPHORUS: 3 mg/dL (ref 2.5–4.6)
Phosphorus: 2.8 mg/dL (ref 2.5–4.6)
Potassium: 4.5 mmol/L (ref 3.5–5.1)
Potassium: 4.4 mmol/L (ref 3.5–5.1)
SODIUM: 138 mmol/L (ref 135–145)
SODIUM: 138 mmol/L (ref 135–145)

## 2015-11-16 LAB — HEPATIC FUNCTION PANEL
ALBUMIN: 2.2 g/dL — AB (ref 3.5–5.0)
ALK PHOS: 92 U/L (ref 38–126)
ALT: 471 U/L — AB (ref 17–63)
AST: 278 U/L — ABNORMAL HIGH (ref 15–41)
BILIRUBIN INDIRECT: 4.7 mg/dL — AB (ref 0.3–0.9)
BILIRUBIN TOTAL: 12.3 mg/dL — AB (ref 0.3–1.2)
Bilirubin, Direct: 7.6 mg/dL — ABNORMAL HIGH (ref 0.1–0.5)
Total Protein: 6 g/dL — ABNORMAL LOW (ref 6.5–8.1)

## 2015-11-16 LAB — CBC
HCT: 32.7 % — ABNORMAL LOW (ref 39.0–52.0)
Hemoglobin: 10.4 g/dL — ABNORMAL LOW (ref 13.0–17.0)
MCH: 31.9 pg (ref 26.0–34.0)
MCHC: 31.8 g/dL (ref 30.0–36.0)
MCV: 100.3 fL — ABNORMAL HIGH (ref 78.0–100.0)
PLATELETS: 35 10*3/uL — AB (ref 150–400)
RBC: 3.26 MIL/uL — AB (ref 4.22–5.81)
RDW: 16.5 % — ABNORMAL HIGH (ref 11.5–15.5)
WBC: 7 10*3/uL (ref 4.0–10.5)

## 2015-11-16 MED ORDER — HYDROCORTISONE NA SUCCINATE PF 100 MG IJ SOLR
50.0000 mg | Freq: Two times a day (BID) | INTRAMUSCULAR | Status: DC
  Administered 2015-11-16 – 2015-11-18 (×4): 50 mg via INTRAVENOUS
  Filled 2015-11-16 (×3): qty 1
  Filled 2015-11-16: qty 2
  Filled 2015-11-16: qty 1

## 2015-11-16 MED ORDER — FOLIC ACID 5 MG/ML IJ SOLN
1.0000 mg | Freq: Once | INTRAMUSCULAR | Status: DC
  Filled 2015-11-16: qty 0.2

## 2015-11-16 MED ORDER — VITAL AF 1.2 CAL PO LIQD
1000.0000 mL | ORAL | Status: DC
  Administered 2015-11-16: 1000 mL

## 2015-11-16 MED ORDER — THIAMINE HCL 100 MG/ML IJ SOLN
100.0000 mg | Freq: Every day | INTRAMUSCULAR | Status: DC
  Administered 2015-11-16 – 2015-11-17 (×2): 100 mg via INTRAVENOUS
  Filled 2015-11-16 (×3): qty 1

## 2015-11-16 MED ORDER — SODIUM CHLORIDE 0.9 % IV SOLN
0.0000 mg/h | INTRAVENOUS | Status: DC
  Administered 2015-11-16: 2 mg/h via INTRAVENOUS
  Administered 2015-11-17 – 2015-11-19 (×5): 5 mg/h via INTRAVENOUS
  Administered 2015-11-19: 2 mg/h via INTRAVENOUS
  Administered 2015-11-20 – 2015-11-21 (×2): 4 mg/h via INTRAVENOUS
  Administered 2015-11-21: 3 mg/h via INTRAVENOUS
  Administered 2015-11-22: 8 mg/h via INTRAVENOUS
  Filled 2015-11-16 (×12): qty 10

## 2015-11-16 MED ORDER — FOLIC ACID 5 MG/ML IJ SOLN
1.0000 mg | Freq: Every day | INTRAMUSCULAR | Status: DC
  Administered 2015-11-16 – 2015-11-17 (×2): 1 mg via INTRAVENOUS
  Filled 2015-11-16 (×3): qty 0.2

## 2015-11-16 MED ORDER — SODIUM CHLORIDE 0.9 % IV SOLN: 1.0000 mg | Freq: Once | INTRAVENOUS | Status: DC

## 2015-11-16 NOTE — Progress Notes (Signed)
Patient ID: DEYONTE CADDEN, male   DOB: 04/19/1956, 60 y.o.   MRN: [id number]  [facility] Progress Note   Assessment/ Plan:   1. AKI: Anuric and multifactorial secondary to rhabdomyolysis, septic shock in the setting of HCV cirrhosis (+/- HRS). I will continue CRRT at the current prescription given his anuric state/likely hypercatabolic postoperative. Maintain ultrafiltration at -50 mL an hour. CVP 8 2. Ischemic bowel: Status post exploratory surgery/bowel resection 3 days ago with ileocolic anastomosis and fascial closure yesterday-plans for trickle feeds to be started today. 3. Hypocalcemia: Calcium 7.6, albumin 2.0 (corrected calcium within acceptable range). Check ionized calcium. 4. MRSA sepsis: On broad-spectrum antibiotic therapy, further management per CCM/ID. 5. Cirrhosis/hepatitis C/thrombocytopenia: With historical report of hepatitis C treated while incarcerated-viral load sent. Unclear whether thrombocytopenia is from splenic sequestration/portal hypertension versus sepsis.  Subjective:   Underwent ileocolic anastomosis with fascial closure yesterday-no immediate complication since.    Objective:   BP 122/80 mmHg  Pulse 89  Temp(Src) 96.1 F (35.6 C) (Oral)  Resp 19  Ht 5' 9" (1.753 m)  Wt 65.9 kg (145 lb 4.5 oz)  BMI 21.44 kg/m2  SpO2 93%  Intake/Output Summary (Last 24 hours) at 11/16/15 0800 Last data filed at 11/16/15 0700  Gross per 24 hour  Intake 1836.06 ml  Output   2705 ml  Net -868.94 ml   Weight change: -0.4 kg (-14.1 oz)  Physical Exam: [id number], awake, not responsive to verbal commands CVS: Pulse regular rhythm, normal rate, S1-S2 with ESM Resp: Coarse breath sounds bilaterally-no rales/rhonchi Abd: Soft, flat, abdominal wound open with negative pressure dressing Ext: Trace lower extremity edema  Imaging: Dg Chest Port 1 View  11/23/2015  CLINICAL DATA:  Hypoxia EXAM: PORTABLE CHEST 1 VIEW COMPARISON:  10/30/2015 FINDINGS:  Endotracheal tube, nasogastric catheter and multiple central venous lines are again identified and stable. No pneumothorax is seen. No focal infiltrate or sizable effusion is noted. IMPRESSION: Tubes and lines as described above.  No acute abnormality noted. Electronically Signed   By: Inez Catalina M.D.   On: 10/29/2015 13:26   Dg Chest Port 1 View  11/07/2015  CLINICAL DATA:  Respiratory failure. EXAM: PORTABLE CHEST 1 VIEW COMPARISON:  11/14/2015. FINDINGS: Endotracheal tube, bilateral IJ lines,, left subclavian line, NG tube in stable position. Right base atelectasis and or infiltrates slight improvement from prior exam. Cardiomegaly with prominent central pulmonary arteries suggesting pulmonary hypertension. No CHF. No pleural effusion or pneumothorax. IMPRESSION: 1. Lines and tubes in stable position. 2. Mild right base subsegmental atelectasis and or infiltrate, slight improvement from prior exam. 3. Cardiomegaly with prominent central pulmonary arteries suggesting pulmonary hypertension. No CHF. Electronically Signed   ByMarcello Moores  Register   On: 10/26/2015 07:10    Labs: BMET  Recent Labs Lab 11/06/2015 0200 10/26/2015 1312 11/14/15 0325 11/14/15 1527 10/26/2015 0410 11/18/2015 1600 11/16/15 0357  NA 142 140 138 136 137 136 138  K 4.2 4.6 4.7 4.2 4.6 4.4 4.5  CL 104 105 102 105 105 106 106  CO2 _0 GLUCOSE 122* 115* 111* 131* 102* 122* 103*  BUN 73* 54* 35* 28* 24* 24* 21*  CREATININE 3.64* 2.81* 2.02* 1.77* 1.48* 1.43* 1.16  CALCIUM 6.6* 6.9* 7.4* 7.4* 7.4* 7.2* 7.6*  PHOS 3.8 4.3 3.2 3.5 2.7 3.2 2.8   CBC  Recent Labs Lab 11/12/2015 0515  10/27/2015 0200 11/14/15 0324 11/24/2015 0410 11/16/15 0357  WBC 12.2*  < > 6.9 6.9 5.7  7.0  NEUTROABS 10.9*  --  6.1  --   --   --   HGB 15.5  < > 10.1* 10.0* 9.6* 10.4*  HCT 47.1  < > 31.6* 31.9* 30.0* 32.7*  MCV 101.5*  < > 99.4 98.8 98.0 100.3*  PLT 88*  < > 25* 45* 28* 35*  < > = values in this interval not  displayed.  Medications:    . antiseptic oral rinse  7 mL Mouth Rinse QID  . chlorhexidine gluconate (SAGE KIT)  15 mL Mouth Rinse BID  . fluconazole (DIFLUCAN) IV  400 mg Intravenous Q0600  . folic acid  1 mg Intravenous Daily  . hydrocortisone sod succinate (SOLU-CORTEF) inj  50 mg Intravenous Q12H  . insulin aspart  0-9 Units Subcutaneous Q4H  . pantoprazole (PROTONIX) IV  40 mg Intravenous QHS  . piperacillin-tazobactam (ZOSYN)  IV  2.25 g Intravenous Q6H  . sodium chloride flush  10-40 mL Intravenous Q12H  . sodium chloride flush  10-40 mL Intracatheter Q12H  . thiamine IV  100 mg Intravenous Daily  . vancomycin  750 mg Intravenous Q24H   Elmarie Shiley, MD 11/16/2015, 8:00 AM

## 2015-11-16 NOTE — Progress Notes (Addendum)
[place] for Infectious Disease  Day 7: vanco, piptazo, and fluconazole  Reason for visit: Follow up on sepsis, HIV  Interval History: s/p repeat xlap and ileocolic anastamosis and abdominal fascia closure on 5/22  S: afebrile, off of levophed, remains on CRRT and ventilator    Physical Exam: Constitutional:  Filed Vitals:   11/16/15 1030 11/16/15 1100  BP:  84/67  Pulse: 76 75  Temp: 96.3 F (35.7 C) 96.3 F (35.7 C)  Resp: 20 20   patient appears in NAD, eyes occasionally opened but not tracking, jaundiced Eyes: icterus HENT: ET in place; left IJ, left subclavian, right dialysis cath Respiratory: Normal respiratory effort; CTA B, anterior exam Cardiovascular: tachy RR GI: absent bowel sounds  Review of Systems: Unable to be assessed due to mental status  Lab Results  Component Value Date   WBC 7.0 11/16/2015   HGB 10.4* 11/16/2015   HCT 32.7* 11/16/2015   MCV 100.3* 11/16/2015   PLT 35* 11/16/2015    Lab Results  Component Value Date   CREATININE 1.16 11/16/2015   BUN 21* 11/16/2015   NA 138 11/16/2015   K 4.5 11/16/2015   CL 106 11/16/2015   CO2 24 11/16/2015    Lab Results  Component Value Date   ALT 471* 11/16/2015   AST 278* 11/16/2015   ALKPHOS 92 11/16/2015     Microbiology: Recent Results (from the past 240 hour(s))  Urine culture     Status: Abnormal   Collection Time: 11/04/2015  5:20 PM  Result Value Ref Range Status   Specimen Description URINE, CATHETERIZED  Final   Special Requests NONE  Final   Culture >=100,000 COLONIES/mL ESCHERICHIA COLI (A)  Final   Report Status 11/17/2015 FINAL  Final   Organism ID, Bacteria ESCHERICHIA COLI (A)  Final      Susceptibility   Escherichia coli - MIC*    AMPICILLIN <=2 SENSITIVE Sensitive     CEFAZOLIN <=4 SENSITIVE Sensitive     CEFTRIAXONE <=1 SENSITIVE Sensitive     CIPROFLOXACIN <=0.25 SENSITIVE Sensitive     GENTAMICIN <=1 SENSITIVE Sensitive     IMIPENEM <=0.25 SENSITIVE  Sensitive     NITROFURANTOIN <=16 SENSITIVE Sensitive     TRIMETH/SULFA <=20 SENSITIVE Sensitive     AMPICILLIN/SULBACTAM <=2 SENSITIVE Sensitive     PIP/TAZO <=4 SENSITIVE Sensitive     * >=100,000 COLONIES/mL ESCHERICHIA COLI  Blood culture (routine x 2)     Status: Abnormal   Collection Time: 10/30/2015  8:45 PM  Result Value Ref Range Status   Specimen Description BLOOD RIGHT ARM  Final   Special Requests BOTTLES DRAWN AEROBIC AND ANAEROBIC 6CC  Final   Culture  Setup Time   Final    GRAM POSITIVE COCCI IN CLUSTERS RECOVERED FROM BOTH BOTTLES. Gram Stain Report Called to,Read Back By and Verified With: HARVEY,T. [facility] [place]) AT 0923 ON 10/30/2015 BY BAUGHAM,M. Performed at [facility]    Culture (A)  Final    STAPHYLOCOCCUS AUREUS SUSCEPTIBILITIES PERFORMED ON PREVIOUS CULTURE WITHIN THE LAST 5 DAYS. Performed at [facility]    Report Status 10/31/2015 FINAL  Final  Blood culture (routine x 2)     Status: Abnormal   Collection Time: 11/14/2015  9:15 PM  Result Value Ref Range Status   Specimen Description BLOOD LEFT ARM  Final   Special Requests BOTTLES DRAWN AEROBIC AND ANAEROBIC [facility]  Final   Culture  Setup Time   Final  GRAM POSITIVE COCCI IN CLUSTERS RECOVERED [place] BOTTLES Gram Stain Report Called to,Read Back By and Verified With: HARVEY,T [facility] [place]) AT 6503 ON 11/18/2015 BY BAUGHAM,M. Performed at [place] ID to follow CRITICAL RESULT CALLED TO, READ BACK BY AND VERIFIED WITH: R RUMBARGAR PHARMD 1800 11/08/2015 A BROWNING Performed at [place] (A)  Final   Report Status 11/09/2015 FINAL  Final   Organism ID, Bacteria METHICILLIN RESISTANT STAPHYLOCOCCUS AUREUS  Final      Susceptibility   Methicillin resistant staphylococcus aureus - MIC*    CIPROFLOXACIN >=8 RESISTANT Resistant     ERYTHROMYCIN >=8 RESISTANT Resistant     GENTAMICIN <=0.5 SENSITIVE Sensitive      OXACILLIN >=4 RESISTANT Resistant     TETRACYCLINE <=1 SENSITIVE Sensitive     VANCOMYCIN 1 SENSITIVE Sensitive     TRIMETH/SULFA <=10 SENSITIVE Sensitive     CLINDAMYCIN <=0.25 SENSITIVE Sensitive     RIFAMPIN <=0.5 SENSITIVE Sensitive     Inducible Clindamycin NEGATIVE Sensitive     * METHICILLIN RESISTANT STAPHYLOCOCCUS AUREUS  Blood Culture ID Panel (Reflexed)     Status: Abnormal   Collection Time: 11/06/2015  9:15 PM  Result Value Ref Range Status   Enterococcus species NOT DETECTED NOT DETECTED Final   Vancomycin resistance NOT DETECTED NOT DETECTED Final   Listeria monocytogenes NOT DETECTED NOT DETECTED Final   Staphylococcus species NOT DETECTED NOT DETECTED Final   Staphylococcus aureus DETECTED (A) NOT DETECTED Final    Comment: CRITICAL RESULT CALLED TO, READ BACK BY AND VERIFIED WITH: R RUMBARGAR PHARMD 1800 10/26/2015 A BROWNING    Methicillin resistance DETECTED (A) NOT DETECTED Final    Comment: CRITICAL RESULT CALLED TO, READ BACK BY AND VERIFIED WITH: R RUMBARGAR PHARMD 1800 10/29/2015 A BROWNING    Streptococcus species NOT DETECTED NOT DETECTED Final   Streptococcus agalactiae NOT DETECTED NOT DETECTED Final   Streptococcus pneumoniae NOT DETECTED NOT DETECTED Final   Streptococcus pyogenes NOT DETECTED NOT DETECTED Final   Acinetobacter baumannii NOT DETECTED NOT DETECTED Final   Enterobacteriaceae species NOT DETECTED NOT DETECTED Final   Enterobacter cloacae complex NOT DETECTED NOT DETECTED Final   Escherichia coli NOT DETECTED NOT DETECTED Final   Klebsiella oxytoca NOT DETECTED NOT DETECTED Final   Klebsiella pneumoniae NOT DETECTED NOT DETECTED Final   Proteus species NOT DETECTED NOT DETECTED Final   Serratia marcescens NOT DETECTED NOT DETECTED Final   Carbapenem resistance NOT DETECTED NOT DETECTED Final   Haemophilus influenzae NOT DETECTED NOT DETECTED Final   Neisseria meningitidis NOT DETECTED NOT DETECTED Final   Pseudomonas aeruginosa NOT DETECTED  NOT DETECTED Final   Candida albicans NOT DETECTED NOT DETECTED Final   Candida glabrata NOT DETECTED NOT DETECTED Final   Candida krusei NOT DETECTED NOT DETECTED Final   Candida parapsilosis NOT DETECTED NOT DETECTED Final   Candida tropicalis NOT DETECTED NOT DETECTED Final    Comment: Performed at [facility]  MRSA PCR Screening     Status: Abnormal   Collection Time: 10/31/2015  6:08 AM  Result Value Ref Range Status   MRSA by PCR POSITIVE (A) NEGATIVE Final    Comment:        The GeneXpert MRSA Assay (FDA approved for NASAL specimens only), is one component of a comprehensive MRSA colonization surveillance program. It is not intended to diagnose MRSA infection nor to guide or monitor treatment for MRSA infections. RESULT CALLED TO, READ  BACK BY AND VERIFIED WITH: Carma Leaven AT 1443 10/31/2015 BY K BARR   Culture, blood (routine x 2)     Status: None (Preliminary result)   Collection Time: 11/14/15  5:30 AM  Result Value Ref Range Status   Specimen Description BLOOD LEFT ANTECUBITAL  Final   Special Requests BOTTLES DRAWN AEROBIC ONLY 3CC  Final   Culture NO GROWTH 1 DAY  Final   Report Status PENDING  Incomplete  Culture, blood (routine x 2)     Status: None (Preliminary result)   Collection Time: 11/14/15  6:35 AM  Result Value Ref Range Status   Specimen Description BLOOD RIGHT ARM  Final   Special Requests BOTTLES DRAWN AEROBIC ONLY 5CC  Final   Culture NO GROWTH 1 DAY  Final   Report Status PENDING  Incomplete  Surgical pcr screen     Status: Abnormal   Collection Time: 11/14/15  8:06 PM  Result Value Ref Range Status   MRSA, PCR POSITIVE (A) NEGATIVE Final    Comment: RESULT CALLED TO, READ BACK BY AND VERIFIED WITH: MSteffanie Dunn AT 0747 ON [id number] BY S. YARBROUGH    Staphylococcus aureus POSITIVE (A) NEGATIVE Final    Comment:        The Xpert SA Assay (FDA approved for NASAL specimens in patients over 5 years of age), is one component of a  comprehensive surveillance program.  Test performance has been validated by [facility] for patients greater than or equal to 33 year old. It is not intended to diagnose infection nor to guide or monitor treatment.     Impression/Plan:  1. Sepsis - MRSA bacteremia - blood cultures from 5/21 are no growth to date. Continue on vancomycin 2.  Ischemic bowel with perforation = continue on zosyn and fluconazole. Starting very small volume/trickle tube feeds as directed by general surgery 3.  HIV - CD 4 count of 120 (in setting of acute illness)/VL 192,000. Has been off of treatment x 5-6 months. We will defer starting ARVs for now until able to get oral access and can digest food 4. 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  Resultant thrombocytopenia. Also now jaundiced with Tbili at 12, roughly same as yesterday. Predominately conjugated. Continue to monitor 5. transaminitis = likely due to sepsis, hypotension plus cirrhosis. continues to trend down. On admit, ast/alt in 1500s, now down to ast 278 and ALT 471.  6.  Hepatitis C - reportedly treated in jail. Viral load sent. Await for return 7. Thrombocytopenia - cirrhosis and acute sepsis.  8. aki = continue on CRRT

## 2015-11-16 NOTE — Progress Notes (Signed)
[facility] Progress Note Patient Name: Casey Pennington DOB: [birth year] MRN: [id number]   Date of Service  11/16/2015  HPI/Events of Note  Agitation - in spite of a Fentanyl IV infusion at 400 mcg/hour and Versed IV PRN.   eICU Interventions  Will order a Versed IV infusion. Titrate to RASS = 9 to -1.     Intervention Category Minor Interventions: Agitation / anxiety - evaluation and management  Sommer,Roen Eugene 11/16/2015, 10:06 PM

## 2015-11-16 NOTE — Progress Notes (Addendum)
Nutrition Follow-up / Consult  DOCUMENTATION CODES:   Not applicable  INTERVENTION:    Trickle TF with Vital AF 1.2 at 20 ml/h (provides 480 ml, 576 kcals, 36 gm protein, 389 ml free water daily.   NUTRITION DIAGNOSIS:   Inadequate oral intake related to inability to eat as evidenced by NPO status.  Ongoing  GOAL:   Patient will meet greater than or equal to 90% of their needs  Progressing  MONITOR:   Vent status, Labs, Weight trends, Skin, I & O's  REASON FOR ASSESSMENT:   Consult Enteral/tube feeding initiation and management (trickle tube feedings)  ASSESSMENT:   Pt with PMH of HTN, hepatitis C cirrhosis, pulmonary HTN, HIV, and polysubstance abuse including injected medications. Pt had been on a 2-3 day binge of injecting and snorting various amphetamines, opiates, gabapentin, and sildenafil. In the ED the patient was found to be tachycardic despite 3 L of IV fluid resuscitation. He was also febrile and agitated. Due to SOB and abdominal pain with constipation he had CT of chest and abdomen, which demonstrated pneumatosis of distal small bowel loops.   S/P ex lap with small bowel resection and application of open abd wound VAC 5/18. On CRRT since 5/19. S/P ileocolic anastomosis with closure of abdomen on 5/22. Per Surgery Team, now okay to start trickle tube feedings.  Labs and medications reviewed. Patient is currently intubated on ventilator support MV: 13.7 L/min Temp (24hrs), Avg:95.4 F (35.2 C), Min:94.1 F (34.5 C), Max:97.3 F (36.3 C)   Diet Order:  Diet NPO time specified  Skin:  Reviewed, no issues  Last BM:  unknown  Height:   Ht Readings from Last 1 Encounters:  11/19/2015 _0  (1.753 m)    Weight:   Wt Readings from Last 1 Encounters:  11/16/15 145 lb 4.5 oz (65.9 kg)    Ideal Body Weight:  72.7 kg  BMI:  Body mass index is 21.44 kg/(m^2).  Estimated Nutritional Needs:   Kcal:  1690  Protein:  105-120 gm  Fluid:  1.8-2  L  EDUCATION NEEDS:   No education needs identified at this time  Molli Barrows, [place], [place], [place] Pager [phone] After Hours Pager [phone]

## 2015-11-16 NOTE — Progress Notes (Signed)
1 Day Post-Op  Subjective: Intubated and sedated  Objective: Vital signs in last 24 hours: Temp:  [94.1 F (34.5 C)-97.8 F (36.6 C)] 96.1 F (35.6 C) (05/23 0715) Pulse Rate:  [66-89] 89 (05/23 0715) Resp:  [13-29] 19 (05/23 0715) BP: (83-138)/(67-99) 122/80 mmHg (05/23 0700) SpO2:  [93 %-100 %] 93 % (05/23 0715) Arterial Line BP: (89-147)/(47-77) 135/66 mmHg (05/23 0715) FiO2 (%):  [40 %-60 %] 40 % (05/23 0400) Weight:  [65.9 kg (145 lb 4.5 oz)] 65.9 kg (145 lb 4.5 oz) (05/23 0300)    Intake/Output from previous day: 05/22 0701 - 05/23 0700 In: 1878 [I.V.:1238; NG/GT:90; IV Piggyback:550] Out: 2826 [Urine:6; Emesis/NG output:250; Blood:25] Intake/Output this shift:    Abdomen soft, wound stable  Lab Results:   Recent Labs  11/24/2015 0410 11/16/15 0357  WBC 5.7 7.0  HGB 9.6* 10.4*  HCT 30.0* 32.7*  PLT 28* 35*   BMET  Recent Labs  11/09/2015 1600 11/16/15 0357  NA 136 138  K 4.4 4.5  CL 106 106  CO2 24 24  GLUCOSE 122* 103*  BUN 24* 21*  CREATININE 1.43* 1.16  CALCIUM 7.2* 7.6*   PT/INR  Recent Labs  11/14/15 0324  LABPROT 17.1*  INR 1.38   ABG  Recent Labs  11/14/15 0305 11/16/2015 1205  PHART 7.444 7.453*  HCO3 24.8* 22.4    Studies/Results: Dg Chest Port 1 View  10/26/2015  CLINICAL DATA:  Hypoxia EXAM: PORTABLE CHEST 1 VIEW COMPARISON:  11/01/2015 FINDINGS: Endotracheal tube, nasogastric catheter and multiple central venous lines are again identified and stable. No pneumothorax is seen. No focal infiltrate or sizable effusion is noted. IMPRESSION: Tubes and lines as described above.  No acute abnormality noted. Electronically Signed   By: Inez Catalina M.D.   On: 10/28/2015 13:26   Dg Chest Port 1 View  11/23/2015  CLINICAL DATA:  Respiratory failure. EXAM: PORTABLE CHEST 1 VIEW COMPARISON:  11/14/2015. FINDINGS: Endotracheal tube, bilateral IJ lines,, left subclavian line, NG tube in stable position. Right base atelectasis and or  infiltrates slight improvement from prior exam. Cardiomegaly with prominent central pulmonary arteries suggesting pulmonary hypertension. No CHF. No pleural effusion or pneumothorax. IMPRESSION: 1. Lines and tubes in stable position. 2. Mild right base subsegmental atelectasis and or infiltrate, slight improvement from prior exam. 3. Cardiomegaly with prominent central pulmonary arteries suggesting pulmonary hypertension. No CHF. Electronically Signed   By: Marcello Moores  Register   On: 11/17/2015 07:10    Anti-infectives: Anti-infectives    Start     Dose/Rate Route Frequency Ordered Stop   11/07/2015 0600  fluconazole (DIFLUCAN) IVPB 400 mg     400 mg 100 mL/hr over 120 Minutes Intravenous Daily 11/14/15 0913     11/24/2015 1000  vancomycin (VANCOCIN) IVPB 750 mg/150 ml premix     750 mg 150 mL/hr over 60 Minutes Intravenous Every 24 hours 11/12/15 1343     11/12/15 2100  vancomycin (VANCOCIN) IVPB 1000 mg/200 mL premix  Status:  Discontinued     1,000 mg 200 mL/hr over 60 Minutes Intravenous Every 48 hours 11/19/2015 0622 11/12/15 0940   11/12/15 1400  piperacillin-tazobactam (ZOSYN) IVPB 2.25 g     2.25 g 100 mL/hr over 30 Minutes Intravenous Every 6 hours 11/12/15 1343     11/12/15 1030  vancomycin (VANCOCIN) 1,250 mg in sodium chloride 0.9 % 250 mL IVPB     1,250 mg 166.7 mL/hr over 90 Minutes Intravenous  Once 11/12/15 0940 11/12/15 1256   11/02/2015 2000  vancomycin (VANCOCIN) IVPB 750 mg/150 ml premix  Status:  Discontinued     750 mg 150 mL/hr over 60 Minutes Intravenous Every 24 hours 11/20/2015 2128 11/23/2015 0622   11/03/2015 1400  piperacillin-tazobactam (ZOSYN) IVPB 2.25 g  Status:  Discontinued     2.25 g 100 mL/hr over 30 Minutes Intravenous Every 8 hours 11/14/2015 0622 11/12/15 1343   11/20/2015 1000  abacavir-dolutegravir-lamiVUDine (TRIUMEQ) 600-50-300 MG per tablet 1 tablet  Status:  Discontinued     1 tablet Oral Daily 10/26/2015 2112 11/17/2015 0517   11/18/2015 0700  fluconazole (DIFLUCAN)  IVPB 200 mg  Status:  Discontinued     200 mg 100 mL/hr over 60 Minutes Intravenous Daily 11/09/2015 0621 11/14/15 0913   11/24/2015 0600  piperacillin-tazobactam (ZOSYN) IVPB 3.375 g  Status:  Discontinued     3.375 g 12.5 mL/hr over 240 Minutes Intravenous Every 8 hours 11/01/2015 2128 11/01/2015 0622   10/29/2015 2130  vancomycin (VANCOCIN) IVPB 1000 mg/200 mL premix     1,000 mg 200 mL/hr over 60 Minutes Intravenous  Once 11/04/2015 2112 11/09/2015 2302   11/14/2015 2115  piperacillin-tazobactam (ZOSYN) IVPB 3.375 g     3.375 g 100 mL/hr over 30 Minutes Intravenous  Once 11/18/2015 2112 11/21/2015 2216   11/14/2015 2000  piperacillin-tazobactam (ZOSYN) IVPB 3.375 g  Status:  Discontinued    Comments:  Give as soon as cultures drawn   3.375 g 100 mL/hr over 30 Minutes Intravenous  Once 11/09/2015 1953 10/26/2015 2123   10/26/2015 2000  vancomycin (VANCOCIN) IVPB 1000 mg/200 mL premix  Status:  Discontinued    Comments:  Give as soon as cultures drawn   1,000 mg 200 mL/hr over 60 Minutes Intravenous  Once 11/08/2015 1953 11/24/2015 2123      Assessment/Plan: s/p Procedure(s): EXPLORATORY LAPAROTOMY with closure of abdomen (N/A)  Ileocolic anastomosis with fascial closure yesterday Starting wet to dry dressing changes today Ok to start trickle tube feeds  LOS: 6 days    Casey Pennington A 11/16/2015

## 2015-11-16 NOTE — Progress Notes (Signed)
PULMONARY / CRITICAL CARE MEDICINE   Name: Casey Pennington MRN: [id number] DOB: 1955-12-19    ADMISSION DATE:  10/28/2015  REFERRING MD:  ER  CHIEF COMPLAINT:  Altered mental status  SUBJECTIVE: Patient POD#1 post closure of abdomen. Weaned off Levophed overnight. No other acute events  REVIEW OF SYSTEMS: Unable to obtain as patient is intubated and sedated.   VITAL SIGNS: BP 122/80 mmHg  Pulse 89  Temp(Src) 96.1 F (35.6 C) (Oral)  Resp 19  Ht _0  (1.753 m)  Wt 145 lb 4.5 oz (65.9 kg)  BMI 21.44 kg/m2  SpO2 93%  HEMODYNAMICS: CVP:  [3 mmHg-8 mmHg] 3 mmHg  VENTILATOR SETTINGS: Vent Mode:  [-] PRVC FiO2 (%):  [40 %-60 %] 40 % Set Rate:  [20 bmp] 20 bmp Vt Set:  [570 mL] 570 mL PEEP:  [5 cmH20] 5 cmH20 Plateau Pressure:  [13 cmH20-15 cmH20] 14 cmH20  INTAKE / OUTPUT: I/O last 3 completed shifts: In: 2725.9 [I.V.:1695.9; NG/GT:180; IV [id number]:546] Out: 5035 [Urine:6; Emesis/NG output:550; Drains:375; WSFKC:1275; Blood:25]  PHYSICAL EXAMINATION: General: Sedated. No distress. No family at bedside.  Integument:  Warm & dry. No rash on exposed skin. Abdominal dressing clean and dry. HEENT:  Endotracheal tube in place. Pupils equal. No scleral injection.  Cardiovascular:  Regular rate. Normal S1 & S2 No appreciable JVD.  Pulmonary:  Coarse breath sounds bilaterally. Symmetric chest wall rise on ventilator.  Abdomen: Soft. hypoactive bowel sounds. Abdominal dressing in place. Neurological:  Spontaneously moving all 4 extremities. Doesn't attend to voice or follow commands.  LABS:  BMET  Recent Labs Lab 11/09/2015 0410 11/05/2015 1600 11/16/15 0357  NA 137 136 138  K 4.6 4.4 4.5  CL 105 106 106  CO2 _1 BUN 24* 24* 21*  CREATININE 1.48* 1.43* 1.16  GLUCOSE 102* 122* 103*    Electrolytes  Recent Labs Lab 11/14/15 0324  11/24/2015 0410 10/27/2015 1600 11/16/15 0357  CALCIUM  --   < > 7.4* 7.2* 7.6*  MG 2.4  --  2.4  --  2.5*  PHOS  --   < > 2.7 3.2  2.8  < > = values in this interval not displayed.  CBC  Recent Labs Lab 11/14/15 0324 11/24/2015 0410 11/16/15 0357  WBC 6.9 5.7 7.0  HGB 10.0* 9.6* 10.4*  HCT 31.9* 30.0* 32.7*  PLT 45* 28* 35*    Coag's  Recent Labs Lab 11/12/2015 0515 11/12/15 1341 11/14/15 0324  INR 2.22* 2.65* 1.38    Sepsis Markers  Recent Labs Lab 10/27/2015 2045 10/30/2015 2115 11/19/2015 0535  LATICACIDVEN  --  2.2* 2.6*  PROCALCITON 1.69  --   --     ABG  Recent Labs Lab 10/31/2015 0159 11/14/15 0305 10/31/2015 1205  PHART 7.445 7.444 7.453*  PCO2ART 37.0 35.7 32.0*  PO2ART 94.0 56.0* 63.3*    Liver Enzymes  Recent Labs Lab 11/21/2015 0515 11/12/15 1145  11/14/15 0324  11/20/2015 0410 11/09/2015 1600 11/16/15 0357  AST 1191* 725*  --  500*  --   --   --   --   ALT 372* 418*  --  491*  --   --   --   --   ALKPHOS 97 69  --  87  --   --   --   --   BILITOT 5.7* 16.2*  --  12.7*  --   --   --   --   ALBUMIN 2.7* 2.2*  < > 2.1*  < >  2.0* 2.1* 2.1*  < > = values in this interval not displayed.  Cardiac Enzymes  Recent Labs Lab 10/31/2015 2045 11/07/2015 0915 10/31/2015 1530  TROPONINI 8.53* 25.27* 27.18*    Glucose  Recent Labs Lab 11/08/2015 0825 11/08/2015 1202 11/19/2015 1506 11/19/2015 1944 11/16/15 0002 11/16/15 0321  GLUCAP 105* 114* 110* 110* 92 108*    Imaging Dg Chest Port 1 View  11/18/2015  CLINICAL DATA:  Hypoxia EXAM: PORTABLE CHEST 1 VIEW COMPARISON:  11/18/2015 FINDINGS: Endotracheal tube, nasogastric catheter and multiple central venous lines are again identified and stable. No pneumothorax is seen. No focal infiltrate or sizable effusion is noted. IMPRESSION: Tubes and lines as described above.  No acute abnormality noted. Electronically Signed   By: Inez Catalina M.D.   On: 10/26/2015 13:26     STUDIES:  5/17 CT abd/pelvis >> pneumatosis SB 5/18 Echo >> EF 55 to 97%, grade 1 diastolic CHF, PAS 47 mmHg 5/22 Port CXR:  Improving right basilar opacity. No new opacity.  Lines unchanged in position. 5/22 Port CXR: Endotracheal tube in good position. No new focal opacity or effusion appreciated.   CULTURES: 5/17 Urine >> E coli 5/17 Blood >> MRSA  5/21 Blood >>  ANTIBIOTICS: 5/17 Vancomycin >> 5/17 Zosyn >> 5/18 Diflucan >>  SIGNIFICANT EVENTS: 5/17 Admit, surgery consulted 5/19 ID consulted, renal consulted >> start CRRT 5/22 Abdominal Closure  LINES/TUBES: 5/17 ETT >>  5/18 Lt IJ CVL >> 5/18 Lt femoral a line >> 5/19 Rt IJ HD cath >> 5/19 Lt [place] CVL >>  OGT 5/17 >> Foley 5/17 >>  ASSESSMENT / PLAN:  PULMONARY A: Acute Hypoxic Respiratory Failure  P:   Continue weaning FiO2 for saturation greater than 94%  Goal net negative fluid balance  SBT trials as tolerated  CARDIOVASCULAR A:  NSTEMI Shock - Resolved. Septic. H/O Pulmonary HTN  P:  Levophed to maintain MAP >65 now off Continue monitoring on telemetry  RENAL A:   Acute Renal Failure  P:   Nephrology following Continuing CRRT Trending electrolytes per Nephrology  GASTROINTESTINAL A:   Ischemic Small Bowel - S/P Laparotomy. Protein calorie malnutrition. Hep C with cirrhosis.  P:   NPO Protonix Post op care per [facility] Starting Trickle Tube Feedings today Repeat LFTs tomorrow AM  HEMATOLOGIC A:   Coagulopathy Anemia - Hgb Stable. Thrombocytopenia - Likely combination of sepsis & hepatic cirrhosis.  P:  Trending Cell Counts daily w/ CBC Trending Coags daily SCDs  INFECTIOUS A:   Septic Shock MRSA bacteremia. E coli UTI Peritonitis H/O HIV & Hep C - CD4 120 on 10/28/2015.  P:   Infectious Diseases Following Continuing Vancomycin Day #7, Fluconazole Day #6, & Zosyn Day #7 per their recommendations.  ENDOCRINE A:   Hyperglycemia - Possibly secondary to infection & steroids. Possible Adrenal Insufficiency  P:   Accu-Checks q4hr Low dose SSI per algorithm Wean Hydrocortisone to 39m IV q12hr  NEUROLOGIC A:   Acute Encephalopathy - Likely  metabolic vs Hepatic Encephalopathy.  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 Now with ischemic SB with peritonitis, septic shock NSTEMI, AKI, VDRF, MRSA bacteremia after binge of illicit substances. Shock has now resolved. Attempting to wean steroids. Starting trickle tube feedings.   I have spent a total of 31 minutes of critical care time today  caring for the patient and reviewing the patient's electronic medical record.  Sonia Baller Ashok Cordia, M.D. [facility] Pulmonary [facility] Critical [facility] Pager:  [phone] After 3pm or if no response, call [phone]  11/16/2015, 7:30 AM

## 2015-11-17 DIAGNOSIS — G9340 Encephalopathy, unspecified: Secondary | ICD-10-CM

## 2015-11-17 LAB — CBC
HCT: 28.4 % — ABNORMAL LOW (ref 39.0–52.0)
HCT: 29.8 % — ABNORMAL LOW (ref 39.0–52.0)
HEMOGLOBIN: 9.6 g/dL — AB (ref 13.0–17.0)
Hemoglobin: 9.2 g/dL — ABNORMAL LOW (ref 13.0–17.0)
MCH: 32.3 pg (ref 26.0–34.0)
MCH: 31.7 pg (ref 26.0–34.0)
MCHC: 32.4 g/dL (ref 30.0–36.0)
MCHC: 32.2 g/dL (ref 30.0–36.0)
MCV: 99.6 fL (ref 78.0–100.0)
MCV: 98.3 fL (ref 78.0–100.0)
Platelets: 23 10*3/uL — CL (ref 150–400)
Platelets: 26 10*3/uL — CL (ref 150–400)
RBC: 2.85 MIL/uL — ABNORMAL LOW (ref 4.22–5.81)
RBC: 3.03 MIL/uL — AB (ref 4.22–5.81)
RDW: 16.9 % — AB (ref 11.5–15.5)
RDW: 17.1 % — ABNORMAL HIGH (ref 11.5–15.5)
WBC: 5.9 10*3/uL (ref 4.0–10.5)
WBC: 8.2 10*3/uL (ref 4.0–10.5)

## 2015-11-17 LAB — BASIC METABOLIC PANEL
ANION GAP: 7 (ref 5–15)
BUN: 20 mg/dL (ref 6–20)
CO2: 25 mmol/L (ref 22–32)
Calcium: 7.5 mg/dL — ABNORMAL LOW (ref 8.9–10.3)
Chloride: 105 mmol/L (ref 101–111)
Creatinine, Ser: 1.19 mg/dL (ref 0.61–1.24)
GFR calc Af Amer: >60 mL/min (ref >60)
GLUCOSE: 105 mg/dL — AB (ref 65–99)
POTASSIUM: 4.4 mmol/L (ref 3.5–5.1)
Sodium: 137 mmol/L (ref 135–145)

## 2015-11-17 LAB — RENAL FUNCTION PANEL
Albumin: 2.1 g/dL — ABNORMAL LOW (ref 3.5–5.0)
Anion gap: 6 (ref 5–15)
BUN: 21 mg/dL — AB (ref 6–20)
CHLORIDE: 106 mmol/L (ref 101–111)
CO2: 24 mmol/L (ref 22–32)
CREATININE: 1.2 mg/dL (ref 0.61–1.24)
Calcium: 7.3 mg/dL — ABNORMAL LOW (ref 8.9–10.3)
GFR calc Af Amer: >60 mL/min (ref >60)
GFR calc non Af Amer: >60 mL/min (ref >60)
Glucose, Bld: 116 mg/dL — ABNORMAL HIGH (ref 65–99)
Phosphorus: 2.9 mg/dL (ref 2.5–4.6)
Potassium: 4.1 mmol/L (ref 3.5–5.1)
Sodium: 136 mmol/L (ref 135–145)

## 2015-11-17 LAB — GLUCOSE, CAPILLARY
GLUCOSE-CAPILLARY: 129 mg/dL — AB (ref 65–99)
Glucose-Capillary: 97 mg/dL (ref 65–99)
Glucose-Capillary: 100 mg/dL — ABNORMAL HIGH (ref 65–99)
Glucose-Capillary: 99 mg/dL (ref 65–99)
Glucose-Capillary: 109 mg/dL — ABNORMAL HIGH (ref 65–99)
Glucose-Capillary: 105 mg/dL — ABNORMAL HIGH (ref 65–99)

## 2015-11-17 LAB — AMMONIA: AMMONIA: 39 umol/L — AB (ref 9–35)

## 2015-11-17 LAB — TYPE AND SCREEN
ABO/RH(D): O POS
ANTIBODY SCREEN: NEGATIVE

## 2015-11-17 LAB — HEPATIC FUNCTION PANEL
ALBUMIN: 2.1 g/dL — AB (ref 3.5–5.0)
ALT: 327 U/L — AB (ref 17–63)
AST: 151 U/L — AB (ref 15–41)
Alkaline Phosphatase: 96 U/L (ref 38–126)
BILIRUBIN DIRECT: 7.7 mg/dL — AB (ref 0.1–0.5)
Indirect Bilirubin: 5.4 mg/dL — ABNORMAL HIGH (ref 0.3–0.9)
Total Bilirubin: 13.1 mg/dL — ABNORMAL HIGH (ref 0.3–1.2)
Total Protein: 5.9 g/dL — ABNORMAL LOW (ref 6.5–8.1)

## 2015-11-17 LAB — PROTIME-INR
INR: 1.42 (ref 0.00–1.49)
PROTHROMBIN TIME: 17.4 s — AB (ref 11.6–15.2)

## 2015-11-17 LAB — APTT: APTT: 34 s (ref 24–37)

## 2015-11-17 LAB — FIBRINOGEN: FIBRINOGEN: 240 mg/dL (ref 204–475)

## 2015-11-17 LAB — VANCOMYCIN, TROUGH: VANCOMYCIN TR: 14 ug/mL (ref 10.0–20.0)

## 2015-11-17 LAB — MAGNESIUM: MAGNESIUM: 2.4 mg/dL (ref 1.7–2.4)

## 2015-11-17 MED ORDER — VANCOMYCIN HCL IN DEXTROSE 1-5 GM/200ML-% IV SOLN
1000.0000 mg | INTRAVENOUS | Status: DC
  Administered 2015-11-18 – 2015-11-21 (×4): 1000 mg via INTRAVENOUS
  Filled 2015-11-17 (×4): qty 200

## 2015-11-17 MED ORDER — VITAL AF 1.2 CAL PO LIQD
1000.0000 mL | ORAL | Status: DC
Start: 2015-11-17 — End: 2015-11-18
  Administered 2015-11-17 – 2015-11-18 (×2): 1000 mL

## 2015-11-17 MED ORDER — SODIUM CHLORIDE 0.9 % IV SOLN: Freq: Once | INTRAVENOUS | Status: AC

## 2015-11-17 NOTE — Progress Notes (Signed)
Pharmacy Antibiotic Note  Casey Pennington is a 60 y.o. male admitted on 10/31/2015 with bacteremia. Patient has had multiple abdominal surgeries with closure on 5/22. Patient remains intubated on CRRT. VT today = 14 mcg/mL.  Plan: Increase vancomycin to 1 g q24h Continue zosyn 2.25 gm iv q6 h and fluconazole 400 mg IV q24 h (consider d/c of these abx with patient tolerated TF and abdomen is closed) Adjust abx when CRRT is dc  Height: _0  (175.3 cm) Weight: 140 lb 6.9 oz (63.7 kg) IBW/kg (Calculated) : 70.7  Temp (24hrs), Avg:96.5 F (35.8 C), Min:95.5 F (35.3 C), Max:97.3 F (36.3 C)   Recent Labs Lab 11/14/2015 2115  10/30/2015 0535  11/12/15 0838  11/18/2015 0200  11/14/15 0324  11/17/2015 0410 10/27/2015 1600 11/16/15 0357 11/16/15 1615 11/17/15 0310 11/17/15 0315 11/17/15 0930  WBC  --   < >  --   < >  --   --  6.9  --  6.9  --  5.7  --  7.0  --   --  5.9  --   CREATININE  --   < >  --   < >  --   < > 3.64*  < >  --   < > 1.48* 1.43* 1.16 1.18 1.19  --   --   LATICACIDVEN 2.2*  --  2.6*  --   --   --   --   --   --   --   --   --   --   --   --   --   --   VANCOTROUGH  --   --   --   --   --   --   --   --   --   --   --   --   --   --   --   --  14  VANCORANDOM  --   --   --   --  12  --   --   --   --   --   --   --   --   --   --   --   --   < > = values in this interval not displayed.  Estimated Creatinine Clearance: 61 mL/min (by C-G formula based on Cr of 1.19).    No Known Allergies   Antimicrobials this admission: 5/17 Vanc >> 5/17 Zosyn >> 5/18 Diflucan >>  Dose adjustments this admission: 5/19 VR: 12 5/24 VT after 6 days CRRT = 14 (750 mg q24h)  Microbiology results: 5/17 BCx: 2/2 mrsa 5/17 UCx: ecoli 5/18 MRSA PCR: + 5/21 Blood x 2 NGTD  Levester Fresh, PharmD, BCPS, [facility] Clinical Pharmacist Pager [phone] 11/17/2015 12:39 PM

## 2015-11-17 NOTE — Progress Notes (Signed)
Patient ID: Casey Pennington, male   DOB: Apr 16, 1956, 60 y.o.   MRN: [id number]  [facility] Progress Note   Assessment/ Plan:   1. AKI: Anuric and multifactorial secondary to rhabdomyolysis, septic shock in the setting of HCV cirrhosis (+/- HRS). I will continue CRRT at the current prescription given his anuric state/likely hypercatabolic postoperative. Tolerating ultrafiltration at 50 mL per hour-based on clinical exam/available data, will maintain this rate and start keeping him even should he get hypotensive. CVP 5 2. Ischemic bowel: Status post exploratory surgery/bowel resection 4 days ago with ileocolic anastomosis and fascial closure 2 days ago-on trickle tube feeds with further escalation for surgery. On anaerobic coverage with Zosyn and fungal coverage with fluconazole. 3. Hypocalcemia: Calcium 7.5, albumin 2.0 (corrected calcium within acceptable range).  4. MRSA sepsis: On antimicrobial coverage with vancomycin-blood cultures from 5/20 one negative to date. 5. Cirrhosis/hepatitis C/thrombocytopenia: With historical report of hepatitis C treated while incarcerated- still manifesting features of hepatic dysfunction. Unclear whether thrombocytopenia is from splenic sequestration/portal hypertension versus sepsis.  Subjective:   No acute events overnight, tolerating CRRT/trickle tube feeds without problems.    Objective:   BP 111/72 mmHg  Pulse 83  Temp(Src) 96.4 F (35.8 C) (Core (Comment))  Resp 20  Ht 5' 9" (1.753 m)  Wt 63.7 kg (140 lb 6.9 oz)  BMI 20.73 kg/m2  SpO2 96%  Intake/Output Summary (Last 24 hours) at 11/17/15 0805 Last data filed at 11/17/15 0800  Gross per 24 hour  Intake 2007.29 ml  Output   3156 ml  Net -1148.71 ml   Weight change: -2.2 kg (-4 lb 13.6 oz)  Physical Exam: WBE:[id number], awake, not responsive to verbal commands CVS: Pulse regular rhythm, normal rate, S1-S2 with ESM Resp: Breath sounds are clear bilaterally-no  rales/rhonchi Abd: Soft, flat, Abdominal wound intact/dry Ext: No lower extremity edema  Imaging: Dg Chest Port 1 View  11/05/2015  CLINICAL DATA:  Hypoxia EXAM: PORTABLE CHEST 1 VIEW COMPARISON:  11/04/2015 FINDINGS: Endotracheal tube, nasogastric catheter and multiple central venous lines are again identified and stable. No pneumothorax is seen. No focal infiltrate or sizable effusion is noted. IMPRESSION: Tubes and lines as described above.  No acute abnormality noted. Electronically Signed   By: Inez Catalina M.D.   On: 11/23/2015 13:26    Labs: BMET  Recent Labs Lab 10/26/2015 1312 11/14/15 0325 11/14/15 1527 11/12/2015 0410 11/06/2015 1600 11/16/15 0357 11/16/15 1615 11/17/15 0310  NA 140 138 136 137 136 138 138 137  K 4.6 4.7 4.2 4.6 4.4 4.5 4.4 4.4  CL 105 102 105 105 106 106 106 105  CO2 _0 GLUCOSE 115* 111* 131* 102* 122* 103* 114* 105*  BUN 54* 35* 28* 24* 24* 21* 20 20  CREATININE 2.81* 2.02* 1.77* 1.48* 1.43* 1.16 1.18 1.19  CALCIUM 6.9* 7.4* 7.4* 7.4* 7.2* 7.6* 7.5* 7.5*  PHOS 4.3 3.2 3.5 2.7 3.2 2.8 3.0  --    CBC  Recent Labs Lab 11/09/2015 0515  10/31/2015 0200 11/14/15 0324 10/26/2015 0410 11/16/15 0357 11/17/15 0315  WBC 12.2*  < > 6.9 6.9 5.7 7.0 5.9  NEUTROABS 10.9*  --  6.1  --   --   --   --   HGB 15.5  < > 10.1* 10.0* 9.6* 10.4* 9.2*  HCT 47.1  < > 31.6* 31.9* 30.0* 32.7* 28.4*  MCV 101.5*  < > 99.4 98.8 98.0 100.3* 99.6  PLT 88*  < >  25* 45* 28* 35* 23*  < > = values in this interval not displayed.  Medications:    . antiseptic oral rinse  7 mL Mouth Rinse QID  . chlorhexidine gluconate (SAGE KIT)  15 mL Mouth Rinse BID  . fluconazole (DIFLUCAN) IV  400 mg Intravenous Q0600  . folic acid  1 mg Intravenous Daily  . hydrocortisone sod succinate (SOLU-CORTEF) inj  50 mg Intravenous Q12H  . insulin aspart  0-9 Units Subcutaneous Q4H  . pantoprazole (PROTONIX) IV  40 mg Intravenous QHS  . piperacillin-tazobactam (ZOSYN)  IV  2.25  g Intravenous Q6H  . sodium chloride flush  10-40 mL Intravenous Q12H  . sodium chloride flush  10-40 mL Intracatheter Q12H  . thiamine IV  100 mg Intravenous Daily  . vancomycin  750 mg Intravenous Q24H   Elmarie Shiley, MD 11/17/2015, 8:05 AM

## 2015-11-17 NOTE — Progress Notes (Signed)
Notified Dr. Ashok Cordia and Dr. Ninfa Linden that the patient had 1 large bloody (bright red) bowel movement. Dr. Ninfa Linden said this can be expected. No new orders were given.

## 2015-11-17 NOTE — Progress Notes (Signed)
PULMONARY / CRITICAL CARE MEDICINE   Name: Casey Pennington MRN: [id number] DOB: 04-30-1956    ADMISSION DATE:  11/18/2015  REFERRING MD:  ER  CHIEF COMPLAINT:  Altered mental status  SUBJECTIVE: Patient POD#2 post closure of abdomen. Remains off vasopressors. No acute events overnight.  REVIEW OF SYSTEMS: Unable to obtain as patient is intubated and sedated.   VITAL SIGNS: BP 81/60 mmHg  Pulse 72  Temp(Src) 96.6 F (35.9 C) (Core (Comment))  Resp 20  Ht 5' 9" (1.753 m)  Wt 140 lb 6.9 oz (63.7 kg)  BMI 20.73 kg/m2  SpO2 100%  HEMODYNAMICS: CVP:  [6 mmHg-8 mmHg] 6 mmHg  VENTILATOR SETTINGS: Vent Mode:  [-] PRVC FiO2 (%):  [40 %] 40 % Set Rate:  [20 bmp] 20 bmp Vt Set:  [570 mL] 570 mL PEEP:  [5 cmH20] 5 cmH20 Plateau Pressure:  [16 cmH20] 16 cmH20  INTAKE / OUTPUT: I/O last 3 completed shifts: In: 2748 [I.V.:1778; NG/GT:170; IV Piggyback:800] Out: 4175 [Urine:11; Emesis/NG output:400; FMZUA:0459; Blood:25]  PHYSICAL EXAMINATION: General: Sedated. Eyes open. No family at bedside.  Integument:  Warm & dry. No rash on exposed skin. Abdominal dressing clean and dry. HEENT:  Endotracheal tube in place. Pupils equal. No scleral injection.  Cardiovascular:  Regular rate. Normal S1 & S2. No appreciable JVD. No edema.  Pulmonary:  Coarse breath sounds bilaterally. Symmetric chest wall rise on ventilator.  Abdomen: Soft. hypoactive bowel sounds. Abdominal dressing in place. Neurological:  Spontaneously moving all 4 extremities. Doesn't follow commands.  LABS:  BMET  Recent Labs Lab 11/16/15 0357 11/16/15 1615 11/17/15 0310  NA 138 138 137  K 4.5 4.4 4.4  CL 106 106 105  CO2 _0 BUN 21* 20 20  CREATININE 1.16 1.18 1.19  GLUCOSE 103* 114* 105*    Electrolytes  Recent Labs Lab 11/14/15 0324  11/20/2015 0410 11/01/2015 1600 11/16/15 0357 11/16/15 1615 11/17/15 0310  CALCIUM  --   < > 7.4* 7.2* 7.6* 7.5* 7.5*  MG 2.4  --  2.4  --  2.5*  --   --   PHOS   --   < > 2.7 3.2 2.8 3.0  --   < > = values in this interval not displayed.  CBC  Recent Labs Lab 10/26/2015 0410 11/16/15 0357 11/17/15 0315  WBC 5.7 7.0 5.9  HGB 9.6* 10.4* 9.2*  HCT 30.0* 32.7* 28.4*  PLT 28* 35* 23*    Coag's  Recent Labs Lab 11/01/2015 0515 11/12/15 1341 11/14/15 0324  INR 2.22* 2.65* 1.38    Sepsis Markers  Recent Labs Lab 11/05/2015 2045 10/26/2015 2115 10/27/2015 0535  LATICACIDVEN  --  2.2* 2.6*  PROCALCITON 1.69  --   --     ABG  Recent Labs Lab 10/25/2015 0159 11/14/15 0305 11/07/2015 1205  PHART 7.445 7.444 7.453*  PCO2ART 37.0 35.7 32.0*  PO2ART 94.0 56.0* 63.3*    Liver Enzymes  Recent Labs Lab 11/14/15 0324  11/16/15 0357 11/16/15 1615 11/17/15 0310  AST 500*  --  278*  --  151*  ALT 491*  --  471*  --  327*  ALKPHOS 87  --  92  --  96  BILITOT 12.7*  --  12.3*  --  13.1*  ALBUMIN 2.1*  < > 2.2*  2.1* 2.1* 2.1*  < > = values in this interval not displayed.  Cardiac Enzymes  Recent Labs Lab 10/27/2015 2045 11/02/2015 0915 11/05/2015 1530  TROPONINI 8.53* 25.27* 27.18*  Glucose  Recent Labs Lab 11/16/15 0831 11/16/15 1200 11/16/15 1454 11/16/15 2057 11/16/15 2325 11/17/15 0313  GLUCAP 97 125* 109* 98 100* 99    Imaging No results found.   STUDIES:  5/17 CT abd/pelvis >> pneumatosis SB 5/18 Echo >> EF 55 to 01%, grade 1 diastolic CHF, PAS 47 mmHg 5/22 Port CXR:  Improving right basilar opacity. No new opacity. Lines unchanged in position. 5/22 Port CXR: Endotracheal tube in good position. No new focal opacity or effusion appreciated.   CULTURES: 5/17 Urine >> E coli 5/17 Blood >> MRSA  5/21 Blood >>  ANTIBIOTICS: 5/17 Vancomycin >> 5/17 Zosyn >> 5/18 Diflucan >>  SIGNIFICANT EVENTS: 5/17 Admit, surgery consulted 5/19 ID consulted, renal consulted >> start CRRT 5/22 Abdominal Closure  LINES/TUBES: 5/17 ETT >>  5/18 Lt IJ CVL >> 5/18 Lt femoral a line >> 5/19 Rt IJ HD cath >> 5/19 Lt [place] CVL  >>  OGT 5/17 >> Foley 5/17 >>  ASSESSMENT / PLAN:  PULMONARY A: Acute Hypoxic Respiratory Failure  P:   Continue weaning FiO2 for saturation greater than 94%  Goal net negative fluid balance  SBT trials as tolerated  CARDIOVASCULAR A:  NSTEMI Shock - Resolved. Septic. H/O Pulmonary HTN  P:  Levophed to maintain MAP >65 now off Continue monitoring on telemetry  RENAL A:   Acute Renal Failure  P:   Nephrology following Continuing CRRT Trending electrolytes per Nephrology  GASTROINTESTINAL A:   Ischemic Small Bowel - S/P Laparotomy. Transaminitis - Improving. Protein calorie malnutrition. Hep C with cirrhosis.  P:   NPO Protonix Post op care per [facility]CS Advancing Trickle Tube Feedings today per surgery recs Trending LFTs intermittently  HEMATOLOGIC A:   Coagulopathy Anemia - Hgb Stable. Thrombocytopenia - Likely combination of sepsis & hepatic cirrhosis.  P:  Trending Cell Counts daily w/ CBC Trending Coags daily SCDs  INFECTIOUS A:   Septic Shock MRSA bacteremia. E coli UTI Peritonitis H/O HIV & Hep C - CD4 120 on 11/23/2015.  P:   Infectious Diseases Following Continuing Vancomycin Day #8, Fluconazole Day #7, & Zosyn Day #8 per their recommendations.  ENDOCRINE A:   Hyperglycemia - Possibly secondary to infection & steroids. Controlled. Possible Adrenal Insufficiency  P:   Accu-Checks q4hr Low dose SSI per algorithm Wean Hydrocortisone to 44m IV q12hr  NEUROLOGIC A:   Acute Encephalopathy - Likely metabolic vs Hepatic Encephalopathy.  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 Now with ischemic SB with peritonitis, septic shock NSTEMI, AKI, VDRF, MRSA bacteremia after binge of illicit substances. Shock has now resolved. Weaning steroids & advancing tube feeds slowly. Patient is  having bowel movements. Mental status is May barrier extubation.  I have spent a total of 33 minutes of critical care time today caring for the patient and reviewing the patient's electronic medical record.  JSonia BallerNAshok Cordia M.D. [facility] Pager:  [phone] 3pm or if no response, call [phone] 11/17/2015, 6:28 AM

## 2015-11-17 NOTE — Progress Notes (Signed)
[facility] for Infectious Disease  Day 8: vanco, piptazo, and fluconazole  Reason for visit: Follow up on sepsis, HIV  Interval History: s/p repeat xlap and ileocolic anastamosis and abdominal fascia closure on 5/22  S: afebrile,  remains on CRRT and ventilator    Physical Exam: Constitutional:  Filed Vitals:   11/17/15 1000 11/17/15 1100  BP: 87/66 129/78  Pulse: 80 95  Temp: 96.3 F (35.7 C) 96.3 F (35.7 C)  Resp: 12 23   patient appears in NAD, eyes occasionally opened but not tracking, jaundiced Eyes: icterus HENT: ET in place; left IJ, left subclavian, right dialysis cath Respiratory: Normal respiratory effort; CTA B, anterior exam Cardiovascular: tachy RR GI: absent bowel sounds  Review of Systems: Unable to be assessed due to mental status  Lab Results  Component Value Date   WBC 5.9 11/17/2015   HGB 9.2* 11/17/2015   HCT 28.4* 11/17/2015   MCV 99.6 11/17/2015   PLT 23* 11/17/2015    Lab Results  Component Value Date   CREATININE 1.19 11/17/2015   BUN 20 11/17/2015   NA 137 11/17/2015   K 4.4 11/17/2015   CL 105 11/17/2015   CO2 25 11/17/2015    Lab Results  Component Value Date   ALT 327* 11/17/2015   AST 151* 11/17/2015   ALKPHOS 96 11/17/2015     Microbiology: Recent Results (from the past 240 hour(s))  Urine culture     Status: Abnormal   Collection Time: 10/28/2015  5:20 PM  Result Value Ref Range Status   Specimen Description URINE, CATHETERIZED  Final   Special Requests NONE  Final   Culture >=100,000 COLONIES/mL ESCHERICHIA COLI (A)  Final   Report Status 11/12/2015 FINAL  Final   Organism ID, Bacteria ESCHERICHIA COLI (A)  Final      Susceptibility   Escherichia coli - MIC*    AMPICILLIN <=2 SENSITIVE Sensitive     CEFAZOLIN <=4 SENSITIVE Sensitive     CEFTRIAXONE <=1 SENSITIVE Sensitive     CIPROFLOXACIN <=0.25 SENSITIVE Sensitive     GENTAMICIN <=1 SENSITIVE Sensitive     IMIPENEM <=0.25 SENSITIVE Sensitive    NITROFURANTOIN <=16 SENSITIVE Sensitive     TRIMETH/SULFA <=20 SENSITIVE Sensitive     AMPICILLIN/SULBACTAM <=2 SENSITIVE Sensitive     PIP/TAZO <=4 SENSITIVE Sensitive     * >=100,000 COLONIES/mL ESCHERICHIA COLI  Blood culture (routine x 2)     Status: Abnormal   Collection Time: 11/07/2015  8:45 PM  Result Value Ref Range Status   Specimen Description BLOOD RIGHT ARM  Final   Special Requests BOTTLES DRAWN AEROBIC AND ANAEROBIC 6CC  Final   Culture  Setup Time   Final    GRAM POSITIVE COCCI IN CLUSTERS RECOVERED FROM BOTH BOTTLES. Gram Stain Report Called to,Read Back By and Verified With: HARVEY,T. [facility] [place]) AT 2952 ON 10/25/2015 BY BAUGHAM,M. Performed at [facility]    Culture (A)  Final    STAPHYLOCOCCUS AUREUS SUSCEPTIBILITIES PERFORMED ON PREVIOUS CULTURE WITHIN THE LAST 5 DAYS. Performed at [facility]    Report Status 11/24/2015 FINAL  Final  Blood culture (routine x 2)     Status: Abnormal   Collection Time: 11/18/2015  9:15 PM  Result Value Ref Range Status   Specimen Description BLOOD LEFT ARM  Final   Special Requests BOTTLES DRAWN AEROBIC AND ANAEROBIC 6CC  Final   Culture  Setup Time   Final    GRAM POSITIVE COCCI IN  CLUSTERS RECOVERED FROMBOTH BOTTLES Gram Stain Report Called to,Read Back By and Verified With: HARVEY,T  [facility]) AT 6301 ON 11/16/2015 BY BAUGHAM,M. Performed at [place] ID to follow CRITICAL RESULT CALLED TO, READ BACK BY AND VERIFIED WITH: R RUMBARGAR PHARMD 1800 11/20/2015 A BROWNING Performed at [place] (A)  Final   Report Status 11/19/2015 FINAL  Final   Organism ID, Bacteria METHICILLIN RESISTANT STAPHYLOCOCCUS AUREUS  Final      Susceptibility   Methicillin resistant staphylococcus aureus - MIC*    CIPROFLOXACIN >=8 RESISTANT Resistant     ERYTHROMYCIN >=8 RESISTANT Resistant     GENTAMICIN <=0.5 SENSITIVE Sensitive     OXACILLIN >=4  RESISTANT Resistant     TETRACYCLINE <=1 SENSITIVE Sensitive     VANCOMYCIN 1 SENSITIVE Sensitive     TRIMETH/SULFA <=10 SENSITIVE Sensitive     CLINDAMYCIN <=0.25 SENSITIVE Sensitive     RIFAMPIN <=0.5 SENSITIVE Sensitive     Inducible Clindamycin NEGATIVE Sensitive     * METHICILLIN RESISTANT STAPHYLOCOCCUS AUREUS  Blood Culture ID Panel (Reflexed)     Status: Abnormal   Collection Time: 11/17/2015  9:15 PM  Result Value Ref Range Status   Enterococcus species NOT DETECTED NOT DETECTED Final   Vancomycin resistance NOT DETECTED NOT DETECTED Final   Listeria monocytogenes NOT DETECTED NOT DETECTED Final   Staphylococcus species NOT DETECTED NOT DETECTED Final   Staphylococcus aureus DETECTED (A) NOT DETECTED Final    Comment: CRITICAL RESULT CALLED TO, READ BACK BY AND VERIFIED WITH: R RUMBARGAR PHARMD 1800 11/18/2015 A BROWNING    Methicillin resistance DETECTED (A) NOT DETECTED Final    Comment: CRITICAL RESULT CALLED TO, READ BACK BY AND VERIFIED WITH: R RUMBARGAR PHARMD 1800 11/09/2015 A BROWNING    Streptococcus species NOT DETECTED NOT DETECTED Final   Streptococcus agalactiae NOT DETECTED NOT DETECTED Final   Streptococcus pneumoniae NOT DETECTED NOT DETECTED Final   Streptococcus pyogenes NOT DETECTED NOT DETECTED Final   Acinetobacter baumannii NOT DETECTED NOT DETECTED Final   Enterobacteriaceae species NOT DETECTED NOT DETECTED Final   Enterobacter cloacae complex NOT DETECTED NOT DETECTED Final   Escherichia coli NOT DETECTED NOT DETECTED Final   Klebsiella oxytoca NOT DETECTED NOT DETECTED Final   Klebsiella pneumoniae NOT DETECTED NOT DETECTED Final   Proteus species NOT DETECTED NOT DETECTED Final   Serratia marcescens NOT DETECTED NOT DETECTED Final   Carbapenem resistance NOT DETECTED NOT DETECTED Final   Haemophilus influenzae NOT DETECTED NOT DETECTED Final   Neisseria meningitidis NOT DETECTED NOT DETECTED Final   Pseudomonas aeruginosa NOT DETECTED NOT DETECTED  Final   Candida albicans NOT DETECTED NOT DETECTED Final   Candida glabrata NOT DETECTED NOT DETECTED Final   Candida krusei NOT DETECTED NOT DETECTED Final   Candida parapsilosis NOT DETECTED NOT DETECTED Final   Candida tropicalis NOT DETECTED NOT DETECTED Final    Comment: Performed at [facility]  MRSA PCR Screening     Status: Abnormal   Collection Time: 11/01/2015  6:08 AM  Result Value Ref Range Status   MRSA by PCR POSITIVE (A) NEGATIVE Final    Comment:        The GeneXpert MRSA Assay (FDA approved for NASAL specimens only), is one component of a comprehensive MRSA colonization surveillance program. It is not intended to diagnose MRSA infection nor to guide or monitor treatment for MRSA infections. RESULT CALLED TO, READ BACK BY AND VERIFIED  WITH: Carma Leaven AT 0034 11/21/2015 BY K BARR   Culture, blood (routine x 2)     Status: None (Preliminary result)   Collection Time: 11/14/15  5:30 AM  Result Value Ref Range Status   Specimen Description BLOOD LEFT ANTECUBITAL  Final   Special Requests BOTTLES DRAWN AEROBIC ONLY 3CC  Final   Culture NO GROWTH 2 DAYS  Final   Report Status PENDING  Incomplete  Culture, blood (routine x 2)     Status: None (Preliminary result)   Collection Time: 11/14/15  6:35 AM  Result Value Ref Range Status   Specimen Description BLOOD RIGHT ARM  Final   Special Requests BOTTLES DRAWN AEROBIC ONLY 5CC  Final   Culture NO GROWTH 2 DAYS  Final   Report Status PENDING  Incomplete  Surgical pcr screen     Status: Abnormal   Collection Time: 11/14/15  8:06 PM  Result Value Ref Range Status   MRSA, PCR POSITIVE (A) NEGATIVE Final    Comment: RESULT CALLED TO, READ BACK BY AND VERIFIED WITH: MSteffanie Dunn AT 0747 ON [id number] BY S. YARBROUGH    Staphylococcus aureus POSITIVE (A) NEGATIVE Final    Comment:        The Xpert SA Assay (FDA approved for NASAL specimens in patients over 79 years of age), is one component of a comprehensive  surveillance program.  Test performance has been validated by [facility] for patients greater than or equal to 48 year old. It is not intended to diagnose infection nor to guide or monitor treatment.     Impression/Plan:  1. Sepsis - MRSA bacteremia - repeat blood cultures from 5/21 are no growth to date. Continue on vancomycin 2.  Ischemic bowel with perforation = continue on zosyn and fluconazole, currently day 8, 2 days post abdominal closure. Will continue to treat 4-7d post closure. Starting on tube feeds 56m/hr 3.  HIV - CD 4 count of 120 (in setting of acute illness)/VL 192,000. Has been off of treatment x 5-6 months. We will defer starting ARVs for now until we see that he is tolerating tube feeds.  4. 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  Resultant thrombocytopenia = Also now jaundiced with Tbili at 13, Predominately conjugated. Continue to monitor 5. transaminitis = likely due to sepsis, hypotension plus cirrhosis. continues to trend down.  6.  Hepatitis C - reportedly treated in jail. Viral load sent. Await for return 7. Thrombocytopenia - cirrhosis and acute sepsis, slightly worse today at 23. Watch for spontaneous bleeding. He had one loose bloody bowel mvmt, anticipated from recent gi surgery 8. aki = continue on CRRT

## 2015-11-17 NOTE — Progress Notes (Signed)
2 Days Post-Op  Subjective: On vent Tolerating trickle tube feeds  Objective: Vital signs in last 24 hours: Temp:  [95.5 F (35.3 C)-97.3 F (36.3 C)] 96.4 F (35.8 C) (05/24 0700) Pulse Rate:  [71-96] 83 (05/24 0700) Resp:  [16-30] 20 (05/24 0700) BP: (81-123)/(60-88) 111/72 mmHg (05/24 0700) SpO2:  [92 %-100 %] 96 % (05/24 0700) Arterial Line BP: (87-157)/(48-85) 117/61 mmHg (05/24 0700) FiO2 (%):  [40 %] 40 % (05/24 0320) Weight:  [63.7 kg (140 lb 6.9 oz)] 63.7 kg (140 lb 6.9 oz) (05/24 0330)    Intake/Output from previous day: 05/23 0701 - 05/24 0700 In: 1885.3 [I.V.:1055.3; NG/GT:380; IV Piggyback:450] Out: 7681 [Urine:10; Emesis/NG output:150] Intake/Output this shift: Total I/O In: 100 [IV Piggyback:100] Out: -   Abdomen soft, non-distended Wound clean  Lab Results:   Recent Labs  11/16/15 0357 11/17/15 0315  WBC 7.0 5.9  HGB 10.4* 9.2*  HCT 32.7* 28.4*  PLT 35* 23*   BMET  Recent Labs  11/16/15 1615 11/17/15 0310  NA 138 137  K 4.4 4.4  CL 106 105  CO2 24 25  GLUCOSE 114* 105*  BUN 20 20  CREATININE 1.18 1.19  CALCIUM 7.5* 7.5*   PT/INR No results for input(s): LABPROT, INR in the last 72 hours. ABG  Recent Labs  11/04/2015 1205  PHART 7.453*  HCO3 22.4    Studies/Results: Dg Chest Port 1 View  11/14/2015  CLINICAL DATA:  Hypoxia EXAM: PORTABLE CHEST 1 VIEW COMPARISON:  11/03/2015 FINDINGS: Endotracheal tube, nasogastric catheter and multiple central venous lines are again identified and stable. No pneumothorax is seen. No focal infiltrate or sizable effusion is noted. IMPRESSION: Tubes and lines as described above.  No acute abnormality noted. Electronically Signed   By: Inez Catalina M.D.   On: 11/17/2015 13:26    Anti-infectives: Anti-infectives    Start     Dose/Rate Route Frequency Ordered Stop   11/09/2015 0600  fluconazole (DIFLUCAN) IVPB 400 mg     400 mg 100 mL/hr over 120 Minutes Intravenous Daily 11/14/15 0913     10/28/2015  1000  vancomycin (VANCOCIN) IVPB 750 mg/150 ml premix     750 mg 150 mL/hr over 60 Minutes Intravenous Every 24 hours 11/12/15 1343     11/12/15 2100  vancomycin (VANCOCIN) IVPB 1000 mg/200 mL premix  Status:  Discontinued     1,000 mg 200 mL/hr over 60 Minutes Intravenous Every 48 hours 11/02/2015 0622 11/12/15 0940   11/12/15 1400  piperacillin-tazobactam (ZOSYN) IVPB 2.25 g     2.25 g 100 mL/hr over 30 Minutes Intravenous Every 6 hours 11/12/15 1343     11/12/15 1030  vancomycin (VANCOCIN) 1,250 mg in sodium chloride 0.9 % 250 mL IVPB     1,250 mg 166.7 mL/hr over 90 Minutes Intravenous  Once 11/12/15 0940 11/12/15 1256   10/29/2015 2000  vancomycin (VANCOCIN) IVPB 750 mg/150 ml premix  Status:  Discontinued     750 mg 150 mL/hr over 60 Minutes Intravenous Every 24 hours 11/17/2015 2128 10/28/2015 0622   11/03/2015 1400  piperacillin-tazobactam (ZOSYN) IVPB 2.25 g  Status:  Discontinued     2.25 g 100 mL/hr over 30 Minutes Intravenous Every 8 hours 11/18/2015 0622 11/12/15 1343   11/19/2015 1000  abacavir-dolutegravir-lamiVUDine (TRIUMEQ) 157-26-203 MG per tablet 1 tablet  Status:  Discontinued     1 tablet Oral Daily 11/02/2015 2112 11/24/2015 0517   11/23/2015 0700  fluconazole (DIFLUCAN) IVPB 200 mg  Status:  Discontinued  200 mg 100 mL/hr over 60 Minutes Intravenous Daily 11/18/2015 0621 11/14/15 0913   10/30/2015 0600  piperacillin-tazobactam (ZOSYN) IVPB 3.375 g  Status:  Discontinued     3.375 g 12.5 mL/hr over 240 Minutes Intravenous Every 8 hours 11/24/2015 2128 10/27/2015 0622   11/09/2015 2130  vancomycin (VANCOCIN) IVPB 1000 mg/200 mL premix     1,000 mg 200 mL/hr over 60 Minutes Intravenous  Once 10/29/2015 2112 11/24/2015 2302   11/12/2015 2115  piperacillin-tazobactam (ZOSYN) IVPB 3.375 g     3.375 g 100 mL/hr over 30 Minutes Intravenous  Once 11/08/2015 2112 11/09/2015 2216   10/29/2015 2000  piperacillin-tazobactam (ZOSYN) IVPB 3.375 g  Status:  Discontinued    Comments:  Give as soon as cultures drawn    3.375 g 100 mL/hr over 30 Minutes Intravenous  Once 11/03/2015 1953 11/08/2015 2123   11/06/2015 2000  vancomycin (VANCOCIN) IVPB 1000 mg/200 mL premix  Status:  Discontinued    Comments:  Give as soon as cultures drawn   1,000 mg 200 mL/hr over 60 Minutes Intravenous  Once 11/14/2015 1953 11/17/2015 2123      Assessment/Plan: s/p Procedure(s): EXPLORATORY LAPAROTOMY with closure of abdomen (N/A)  Will slowly advance tube 30cc/hr Continue wound care  LOS: 7 days    Daphney Hopke A 11/17/2015

## 2015-11-17 NOTE — Progress Notes (Signed)
CSW continues to follow for disposition and substance abuse resources.          Emiliano Dyer, LCSW [facility] ED/59M Clinical Social Worker [phone]

## 2015-11-17 NOTE — Progress Notes (Signed)
CRITICAL VALUE ALERT  Critical value received:  Platelets 23  Date of notification:  11/17/2015  Time of notification:  05:51  Critical value read back:Yes.    Nurse who received alert:  BShepherd, RN  MD notified (1st page):  [place], [place].  Time of first page:  05:53  MD notified (2nd page):  Time of second page:  Responding MD:  Elsworth Soho, Alfonso Patten.  Time MD responded:  05:54

## 2015-11-18 LAB — RENAL FUNCTION PANEL
ANION GAP: 5 (ref 5–15)
Albumin: 2.1 g/dL — ABNORMAL LOW (ref 3.5–5.0)
Albumin: 1.9 g/dL — ABNORMAL LOW (ref 3.5–5.0)
Anion gap: 5 (ref 5–15)
BUN: 20 mg/dL (ref 6–20)
BUN: 19 mg/dL (ref 6–20)
CHLORIDE: 106 mmol/L (ref 101–111)
CHLORIDE: 106 mmol/L (ref 101–111)
CO2: 24 mmol/L (ref 22–32)
CO2: 24 mmol/L (ref 22–32)
CREATININE: 1.07 mg/dL (ref 0.61–1.24)
Calcium: 7.4 mg/dL — ABNORMAL LOW (ref 8.9–10.3)
Calcium: 7.3 mg/dL — ABNORMAL LOW (ref 8.9–10.3)
Creatinine, Ser: 1.22 mg/dL (ref 0.61–1.24)
GFR calc Af Amer: >60 mL/min (ref >60)
GFR calc Af Amer: >60 mL/min (ref >60)
GLUCOSE: 118 mg/dL — AB (ref 65–99)
Glucose, Bld: 115 mg/dL — ABNORMAL HIGH (ref 65–99)
POTASSIUM: 4 mmol/L (ref 3.5–5.1)
POTASSIUM: 4.2 mmol/L (ref 3.5–5.1)
Phosphorus: 2.9 mg/dL (ref 2.5–4.6)
Phosphorus: 2.8 mg/dL (ref 2.5–4.6)
Sodium: 135 mmol/L (ref 135–145)
Sodium: 135 mmol/L (ref 135–145)

## 2015-11-18 LAB — MAGNESIUM: Magnesium: 2.4 mg/dL (ref 1.7–2.4)

## 2015-11-18 LAB — CBC
HEMATOCRIT: 29.9 % — AB (ref 39.0–52.0)
HEMOGLOBIN: 9.5 g/dL — AB (ref 13.0–17.0)
MCH: 32.1 pg (ref 26.0–34.0)
MCHC: 31.8 g/dL (ref 30.0–36.0)
MCV: 101 fL — AB (ref 78.0–100.0)
Platelets: 35 10*3/uL — ABNORMAL LOW (ref 150–400)
RBC: 2.96 MIL/uL — ABNORMAL LOW (ref 4.22–5.81)
RDW: 17.2 % — AB (ref 11.5–15.5)
WBC: 7.1 10*3/uL (ref 4.0–10.5)

## 2015-11-18 LAB — OCCULT BLOOD X 1 CARD TO LAB, STOOL: FECAL OCCULT BLD: POSITIVE — AB

## 2015-11-18 LAB — HEPATIC FUNCTION PANEL
ALT: 215 U/L — ABNORMAL HIGH (ref 17–63)
AST: 73 U/L — AB (ref 15–41)
Albumin: 2.1 g/dL — ABNORMAL LOW (ref 3.5–5.0)
Alkaline Phosphatase: 105 U/L (ref 38–126)
BILIRUBIN INDIRECT: 4.7 mg/dL — AB (ref 0.3–0.9)
Bilirubin, Direct: 7.5 mg/dL — ABNORMAL HIGH (ref 0.1–0.5)
Total Bilirubin: 12.2 mg/dL — ABNORMAL HIGH (ref 0.3–1.2)
Total Protein: 6.2 g/dL — ABNORMAL LOW (ref 6.5–8.1)

## 2015-11-18 LAB — PREPARE PLATELET PHERESIS: Unit division: 0

## 2015-11-18 LAB — GLUCOSE, CAPILLARY
GLUCOSE-CAPILLARY: 115 mg/dL — AB (ref 65–99)
GLUCOSE-CAPILLARY: 111 mg/dL — AB (ref 65–99)
GLUCOSE-CAPILLARY: 108 mg/dL — AB (ref 65–99)
GLUCOSE-CAPILLARY: 143 mg/dL — AB (ref 65–99)
GLUCOSE-CAPILLARY: 106 mg/dL — AB (ref 65–99)
GLUCOSE-CAPILLARY: 103 mg/dL — AB (ref 65–99)

## 2015-11-18 LAB — HEPATITIS C VRS RNA DETECT BY PCR-QUAL: Hepatitis C Vrs RNA by PCR-Qual: NEGATIVE

## 2015-11-18 MED ORDER — HYDROCORTISONE NA SUCCINATE PF 100 MG IJ SOLR: 25.0000 mg | Freq: Two times a day (BID) | INTRAMUSCULAR | Status: DC

## 2015-11-18 MED ORDER — PANTOPRAZOLE SODIUM 40 MG PO PACK
40.0000 mg | PACK | Freq: Every day | ORAL | Status: DC
  Administered 2015-11-18 – 2015-11-19 (×2): 40 mg
  Filled 2015-11-18 (×3): qty 20

## 2015-11-18 MED ORDER — VITAL AF 1.2 CAL PO LIQD: 1000.0000 mL | ORAL | Status: DC

## 2015-11-18 MED ORDER — VITAMIN B-1 100 MG PO TABS
100.0000 mg | ORAL_TABLET | Freq: Every day | ORAL | Status: DC
  Administered 2015-11-18 – 2015-11-19 (×2): 100 mg
  Filled 2015-11-18 (×3): qty 1

## 2015-11-18 MED ORDER — PIPERACILLIN-TAZOBACTAM IN DEX 2-0.25 GM/50ML IV SOLN
2.2500 g | Freq: Four times a day (QID) | INTRAVENOUS | Status: DC
  Administered 2015-11-18 – 2015-11-19 (×4): 2.25 g via INTRAVENOUS
  Filled 2015-11-18 (×8): qty 50

## 2015-11-18 MED ORDER — FOLIC ACID 1 MG PO TABS
1.0000 mg | ORAL_TABLET | Freq: Every day | ORAL | Status: DC
  Administered 2015-11-18 – 2015-11-19 (×2): 1 mg
  Filled 2015-11-18 (×3): qty 1

## 2015-11-18 MED ORDER — SULFAMETHOXAZOLE-TRIMETHOPRIM 200-40 MG/5ML PO SUSP
20.0000 mL | Freq: Every day | ORAL | Status: DC
  Administered 2015-11-18 – 2015-11-22 (×5): 20 mL
  Filled 2015-11-18 (×6): qty 20

## 2015-11-18 NOTE — Progress Notes (Signed)
3 Days Post-Op  Subjective: Rectal tube is draining liquid stool that looks like it is heme positive.  H/H is stable, he opens his eyes but doesn't respond much even to dressing change.  Open site looks good.  Objective: Vital signs in last 24 hours: Temp:  [94.8 F (34.9 C)-97.2 F (36.2 C)] 94.8 F (34.9 C) (05/25 0830) Pulse Rate:  [63-97] 74 (05/25 0830) Resp:  [11-25] 15 (05/25 0830) BP: (80-129)/(62-88) 125/69 mmHg (05/25 0803) SpO2:  [87 %-100 %] 100 % (05/25 0830) Arterial Line BP: (86-162)/(49-86) 119/60 mmHg (05/25 0830) FiO2 (%):  [40 %-50 %] 40 % (05/25 0803) Weight:  [62.6 kg (138 lb 0.1 oz)] 62.6 kg (138 lb 0.1 oz) (05/25 0400) Last BM Date: 11/18/15 2520 intake 10 ml urine 3111- dialysis Stool 900 Afebrile, VSS with pressors Labs stable  No films Intake/Output from previous day: 05/24 0701 - 05/25 0700 In: 2520.8 [I.V.:954.8; Blood:196; NG/GT:770; IV Piggyback:550] Out: 5176 [Urine:10; Stool:900] Intake/Output this shift: Total I/O In: 177 [I.V.:47; NG/GT:30; IV Piggyback:100] Out: 208 [Other:208]  General appearance: opens his eyes, on Vent, and they are trying to wean this AM. GI: open site looks fine, tolerating low rate tube feedings, no BS  Lab Results:   Recent Labs  11/17/15 1821 11/18/15 0415  WBC 8.2 7.1  HGB 9.6* 9.5*  HCT 29.8* 29.9*  PLT 26* 35*    BMET  Recent Labs  11/17/15 1621 11/18/15 0415  NA 136 135  K 4.1 4.0  CL 106 106  CO2 24 24  GLUCOSE 116* 118*  BUN 21* 20  CREATININE 1.20 1.07  CALCIUM 7.3* 7.4*   PT/INR  Recent Labs  11/17/15 1821  LABPROT 17.4*  INR 1.42     Recent Labs Lab 11/12/15 1145  11/14/15 0324  11/16/15 0357 11/16/15 1615 11/17/15 0310 11/17/15 1621 11/18/15 0415  AST 725*  --  500*  --  278*  --  151*  --   --   ALT 418*  --  491*  --  471*  --  327*  --   --   ALKPHOS 69  --  87  --  92  --  96  --   --   BILITOT 16.2*  --  12.7*  --  12.3*  --  13.1*  --   --   PROT 6.0*  --   6.2*  --  6.0*  --  5.9*  --   --   ALBUMIN 2.2*  < > 2.1*  < > 2.2*  2.1* 2.1* 2.1* 2.1* 2.1*  < > = values in this interval not displayed.   Lipase     Component Value Date/Time   LIPASE 21 11/24/2015 0515     Studies/Results: No results found.  Medications: . antiseptic oral rinse  7 mL Mouth Rinse QID  . chlorhexidine gluconate (SAGE KIT)  15 mL Mouth Rinse BID  . fluconazole (DIFLUCAN) IV  400 mg Intravenous Q0600  . folic acid  1 mg Per Tube Daily  . insulin aspart  0-9 Units Subcutaneous Q4H  . pantoprazole sodium  40 mg Per Tube Daily  . piperacillin-tazobactam (ZOSYN)  IV  2.25 g Intravenous Q6H  . sodium chloride flush  10-40 mL Intravenous Q12H  . sodium chloride flush  10-40 mL Intracatheter Q12H  . thiamine  100 mg Per Tube Daily  . vancomycin  1,000 mg Intravenous Q24H   . feeding supplement (VITAL AF 1.2 CAL) 1,000 mL (11/17/15 0800)  .  fentaNYL infusion INTRAVENOUS 200 mcg/hr (11/18/15 0830)  . midazolam (VERSED) infusion 5 mg/hr (11/18/15 0300)  . norepinephrine (LEVOPHED) Adult infusion Stopped (11/09/2015 1800)  . dialysis replacement fluid (prismasate) 1,000 mL/hr at 11/18/15 0556  . dialysis replacement fluid (prismasate) 400 mL/hr at 11/18/15 0401  . 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 Georganna Skeans S/p reopening of recent laparotomy, small bowel resection, placement of negative pressure dressing 100cm^2, 11/06/2015, Gurney Maxin, M.D. S/p EXPLORATORY LAPAROTOMY, ILEOCOLIC ANASTOMOSIS,CLOSURE OF ABDOMEN, 10/29/2015, Dr. Coralie Keens Septic shock/MRSA bacteremia/HIV/Hep C Hypoxic respiratory failure on Vent NSTEMI Shock Acute renal failure Rhabdomyolysis  Encephalopathy Thrombocytopenia  FEN:  As noted above on TF and IV fluids ID: day 9 Zosyn, day 8  diflucan DVT: Coagulopathy,    Plan:  Continue medical management, I will check stool, but H/H is stable, not sure what we would do different at this point.     LOS: 8 days    Casey Pennington 11/18/2015 [phone]

## 2015-11-18 NOTE — Progress Notes (Addendum)
[place] for Infectious Disease  Day 9: vanco, piptazo, and fluconazole  Reason for visit: Follow up on sepsis, HIV  Interval History: s/p repeat xlap and ileocolic anastamosis and abdominal fascia closure on 5/22  S: afebrile,  remains on CRRT and ventilator  24hr: had oozing noted near rectal tube in setting of thrombocytopenia, pt received 1 u PLT yesterday  Physical Exam: Constitutional:  Filed Vitals:   11/18/15 1230 11/18/15 1300  BP:  83/64  Pulse: 77 79  Temp: 95.2 F (35.1 C) 95.7 F (35.4 C)  Resp: 11 12   patient appears in NAD, eyes occasionally opened but not tracking, jaundiced Eyes: icterus HENT: ET in place; left IJ, left subclavian, right dialysis cath Respiratory: Normal respiratory effort; CTA B, anterior exam Cardiovascular:  RRR, no g/m/r GI: absent bowel sounds  Review of Systems: Unable to be assessed due to mental status  Lab Results  Component Value Date   WBC 7.1 11/18/2015   HGB 9.5* 11/18/2015   HCT 29.9* 11/18/2015   MCV 101.0* 11/18/2015   PLT 35* 11/18/2015    Lab Results  Component Value Date   CREATININE 1.07 11/18/2015   BUN 20 11/18/2015   NA 135 11/18/2015   K 4.0 11/18/2015   CL 106 11/18/2015   CO2 24 11/18/2015    Lab Results  Component Value Date   ALT 327* 11/17/2015   AST 151* 11/17/2015   ALKPHOS 96 11/17/2015     Microbiology: Recent Results (from the past 240 hour(s))  Urine culture     Status: Abnormal   Collection Time: 11/19/2015  5:20 PM  Result Value Ref Range Status   Specimen Description URINE, CATHETERIZED  Final   Special Requests NONE  Final   Culture >=100,000 COLONIES/mL ESCHERICHIA COLI (A)  Final   Report Status 10/30/2015 FINAL  Final   Organism ID, Bacteria ESCHERICHIA COLI (A)  Final      Susceptibility   Escherichia coli - MIC*    AMPICILLIN <=2 SENSITIVE Sensitive     CEFAZOLIN <=4 SENSITIVE Sensitive     CEFTRIAXONE <=1 SENSITIVE Sensitive     CIPROFLOXACIN <=0.25 SENSITIVE  Sensitive     GENTAMICIN <=1 SENSITIVE Sensitive     IMIPENEM <=0.25 SENSITIVE Sensitive     NITROFURANTOIN <=16 SENSITIVE Sensitive     TRIMETH/SULFA <=20 SENSITIVE Sensitive     AMPICILLIN/SULBACTAM <=2 SENSITIVE Sensitive     PIP/TAZO <=4 SENSITIVE Sensitive     * >=100,000 COLONIES/mL ESCHERICHIA COLI  Blood culture (routine x 2)     Status: Abnormal   Collection Time: 11/05/2015  8:45 PM  Result Value Ref Range Status   Specimen Description BLOOD RIGHT ARM  Final   Special Requests BOTTLES DRAWN AEROBIC AND ANAEROBIC 6CC  Final   Culture  Setup Time   Final    GRAM POSITIVE COCCI IN CLUSTERS RECOVERED FROM BOTH BOTTLES. Gram Stain Report Called to,Read Back By and Verified With: HARVEY,T. [facility]) AT 0947 ON 11/06/2015 BY BAUGHAM,M. Performed at [facility]    Culture (A)  Final    STAPHYLOCOCCUS AUREUS SUSCEPTIBILITIES PERFORMED ON PREVIOUS CULTURE WITHIN THE LAST 5 DAYS. Performed at [facility]    Report Status 11/03/2015 FINAL  Final  Blood culture (routine x 2)     Status: Abnormal   Collection Time: 11/05/2015  9:15 PM  Result Value Ref Range Status   Specimen Description BLOOD LEFT ARM  Final   Special Requests BOTTLES DRAWN AEROBIC AND  ANAEROBIC 6CC  Final   Culture  Setup Time   Final    GRAM POSITIVE COCCI IN CLUSTERS RECOVERED [place] Gram Stain Report Called to,Read Back By and Verified With: HARVEY,T [facility] [place]) AT 1224 ON 11/05/2015 BY BAUGHAM,M. Performed at [place] ID to follow CRITICAL RESULT CALLED TO, READ BACK BY AND VERIFIED WITH: R RUMBARGAR PHARMD 1800 11/20/2015 A BROWNING Performed at [place] (A)  Final   Report Status 11/09/2015 FINAL  Final   Organism ID, Bacteria METHICILLIN RESISTANT STAPHYLOCOCCUS AUREUS  Final      Susceptibility   Methicillin resistant staphylococcus aureus - MIC*    CIPROFLOXACIN >=8 RESISTANT Resistant      ERYTHROMYCIN >=8 RESISTANT Resistant     GENTAMICIN <=0.5 SENSITIVE Sensitive     OXACILLIN >=4 RESISTANT Resistant     TETRACYCLINE <=1 SENSITIVE Sensitive     VANCOMYCIN 1 SENSITIVE Sensitive     TRIMETH/SULFA <=10 SENSITIVE Sensitive     CLINDAMYCIN <=0.25 SENSITIVE Sensitive     RIFAMPIN <=0.5 SENSITIVE Sensitive     Inducible Clindamycin NEGATIVE Sensitive     * METHICILLIN RESISTANT STAPHYLOCOCCUS AUREUS  Blood Culture ID Panel (Reflexed)     Status: Abnormal   Collection Time: 10/26/2015  9:15 PM  Result Value Ref Range Status   Enterococcus species NOT DETECTED NOT DETECTED Final   Vancomycin resistance NOT DETECTED NOT DETECTED Final   Listeria monocytogenes NOT DETECTED NOT DETECTED Final   Staphylococcus species NOT DETECTED NOT DETECTED Final   Staphylococcus aureus DETECTED (A) NOT DETECTED Final    Comment: CRITICAL RESULT CALLED TO, READ BACK BY AND VERIFIED WITH: R RUMBARGAR PHARMD 1800 11/24/2015 A BROWNING    Methicillin resistance DETECTED (A) NOT DETECTED Final    Comment: CRITICAL RESULT CALLED TO, READ BACK BY AND VERIFIED WITH: R RUMBARGAR PHARMD 1800 11/06/2015 A BROWNING    Streptococcus species NOT DETECTED NOT DETECTED Final   Streptococcus agalactiae NOT DETECTED NOT DETECTED Final   Streptococcus pneumoniae NOT DETECTED NOT DETECTED Final   Streptococcus pyogenes NOT DETECTED NOT DETECTED Final   Acinetobacter baumannii NOT DETECTED NOT DETECTED Final   Enterobacteriaceae species NOT DETECTED NOT DETECTED Final   Enterobacter cloacae complex NOT DETECTED NOT DETECTED Final   Escherichia coli NOT DETECTED NOT DETECTED Final   Klebsiella oxytoca NOT DETECTED NOT DETECTED Final   Klebsiella pneumoniae NOT DETECTED NOT DETECTED Final   Proteus species NOT DETECTED NOT DETECTED Final   Serratia marcescens NOT DETECTED NOT DETECTED Final   Carbapenem resistance NOT DETECTED NOT DETECTED Final   Haemophilus influenzae NOT DETECTED NOT DETECTED Final   Neisseria  meningitidis NOT DETECTED NOT DETECTED Final   Pseudomonas aeruginosa NOT DETECTED NOT DETECTED Final   Candida albicans NOT DETECTED NOT DETECTED Final   Candida glabrata NOT DETECTED NOT DETECTED Final   Candida krusei NOT DETECTED NOT DETECTED Final   Candida parapsilosis NOT DETECTED NOT DETECTED Final   Candida tropicalis NOT DETECTED NOT DETECTED Final    Comment: Performed at [facility]  MRSA PCR Screening     Status: Abnormal   Collection Time: 11/09/2015  6:08 AM  Result Value Ref Range Status   MRSA by PCR POSITIVE (A) NEGATIVE Final    Comment:        The GeneXpert MRSA Assay (FDA approved for NASAL specimens only), is one component of a comprehensive MRSA colonization surveillance program. It is not intended to  diagnose MRSA infection nor to guide or monitor treatment for MRSA infections. RESULT CALLED TO, READ BACK BY AND VERIFIED WITH: Carma Leaven AT 0177 11/21/2015 BY K BARR   Culture, blood (routine x 2)     Status: None (Preliminary result)   Collection Time: 11/14/15  5:30 AM  Result Value Ref Range Status   Specimen Description BLOOD LEFT ANTECUBITAL  Final   Special Requests BOTTLES DRAWN AEROBIC ONLY 3CC  Final   Culture NO GROWTH 3 DAYS  Final   Report Status PENDING  Incomplete  Culture, blood (routine x 2)     Status: None (Preliminary result)   Collection Time: 11/14/15  6:35 AM  Result Value Ref Range Status   Specimen Description BLOOD RIGHT ARM  Final   Special Requests BOTTLES DRAWN AEROBIC ONLY 5CC  Final   Culture NO GROWTH 3 DAYS  Final   Report Status PENDING  Incomplete  Surgical pcr screen     Status: Abnormal   Collection Time: 11/14/15  8:06 PM  Result Value Ref Range Status   MRSA, PCR POSITIVE (A) NEGATIVE Final    Comment: RESULT CALLED TO, READ BACK BY AND VERIFIED WITH: MSteffanie Dunn AT 0747 ON [id number] BY S. YARBROUGH    Staphylococcus aureus POSITIVE (A) NEGATIVE Final    Comment:        The Xpert SA Assay  (FDA approved for NASAL specimens in patients over 30 years of age), is one component of a comprehensive surveillance program.  Test performance has been validated by [facility] for patients greater than or equal to 77 year old. It is not intended to diagnose infection nor to guide or monitor treatment.     Impression/Plan:  1. Sepsis - MRSA bacteremia - repeat blood cultures from 5/21 are no growth to date. Continue on vancomycin. Plan to treat miminum of 14 days using 5/21 as day 1 of mrsa tx 2.  Ischemic bowel with perforation = continue on zosyn and fluconazole, currently day 9, will consider stopping over the weekend since it will be roughly 4 days since abdominal closure if he is still stable 3.  HIV - CD 4 count of 120 (in setting of acute illness)/VL 192,000. Has been off of treatment x 5-6 months. We will defer starting ARVs for now until we see that he is tolerating tube feeds. oi proph will give bactrim ds TIW 4. 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  Resultant thrombocytopenia = continue to monitor to see if ongoing spontaneous bleed 5. transaminitis =  continues to trend down.  6. Jaundice due to conjugated hyperbili (tbili 13) = will repeat hepatic panel to see if changing, hoping for improvemeng. Recommend to get RUQ  Hepatitis C - reportedly treated in jail. Viral load sent. Await for return 7. Thrombocytopenia - cirrhosis and acute sepsis, received 1 u PLT due to spontaneous bleeding. 8. aki = continue on CRRT

## 2015-11-18 NOTE — Progress Notes (Signed)
Pharmacy Antibiotic Note  Casey Pennington is a 60 y.o. male admitted on 11/09/2015 with MRSA bacteremia and s/p EColi UTI with ongoing abdominal process. .  Pharmacy has been consulted for vancomycin, Zosyn, Diflucan and now Bactrim dosing for PCP prophylaxis.  Plan: Bactrim (174m Trimethoprim) per tube daily for PCP prophylaxis.  Continue vancomycin 1g IV every 24 hours.  Continue Zosyn 2.25g IV every 6 hours Continue Fluconazole 4047mIV every 24 hours.   Height: _0  (175.3 cm) Weight: 138 lb 0.1 oz (62.6 kg) IBW/kg (Calculated) : 70.7  Temp (24hrs), Avg:95.1 F (35.1 C), Min:94.6 F (34.8 C), Max:96.8 F (36 C)   Recent Labs Lab 11/12/15 0838  11/12/2015 0410  11/16/15 0357 11/16/15 1615 11/17/15 0310 11/17/15 0315 11/17/15 0930 11/17/15 1621 11/17/15 1821 11/18/15 0415  WBC  --   < > 5.7  --  7.0  --   --  5.9  --   --  8.2 7.1  CREATININE  --   < > 1.48*  < > 1.16 1.18 1.19  --   --  1.20  --  1.07  VANCOTROUGH  --   --   --   --   --   --   --   --  14  --   --   --   VANCORANDOM 12  --   --   --   --   --   --   --   --   --   --   --   < > = values in this interval not displayed.  Estimated Creatinine Clearance: 66.6 mL/min (by C-G formula based on Cr of 1.07).    No Known Allergies  Antimicrobials this admission: 5/17 Vanc >> 5/17 Zosyn >> 5/18 Fluconazole >>  Dose adjustments this admission: 5/19 Vanc random = 12 5/24 VT after 6 days CRRT = 14 (750 mg q24h)  Microbiology results: 5/17 BCx: 2/2 MRSA 5/17 UCx: ecoli (Pan-sensitive) 5/18 MRSA PCR: + 5/21 Blood x 2: ngx2d  Thank you for allowing pharmacy to be a part of this patient's care.  JeSloan LeiterPharmD, BCPS Clinical Pharmacist [id number] [phone] [id number]/25/2017 2:16 PM

## 2015-11-18 NOTE — Progress Notes (Signed)
PULMONARY / CRITICAL CARE MEDICINE   Name: Casey Pennington MRN: [id number] DOB: 1956-02-16    ADMISSION DATE:  11/12/2015  REFERRING MD:  ER  CHIEF COMPLAINT:  Altered mental status  SUBJECTIVE: Patient POD#3 post closure of abdomen. Remains off vasopressors. No acute events overnight. Still not following commands. Patient had some bleeding around rectal tube yesterday evening that resolved after transfusion of platelets.  REVIEW OF SYSTEMS: Unable to obtain as patient is intubated and sedated.   VITAL SIGNS: BP 97/72 mmHg  Pulse 65  Temp(Src) 95 F (35 C) (Core (Comment))  Resp 21  Ht 5' 9" (1.753 m)  Wt 138 lb 0.1 oz (62.6 kg)  BMI 20.37 kg/m2  SpO2 100%  HEMODYNAMICS: CVP:  [5 mmHg-29 mmHg] 8 mmHg  VENTILATOR SETTINGS: Vent Mode:  [-] PRVC FiO2 (%):  [40 %-50 %] 40 % Set Rate:  [20 bmp] 20 bmp Vt Set:  [570 mL] 570 mL PEEP:  [5 cmH20] 5 cmH20 Pressure Support:  [5 cmH20-10 cmH20] 10 cmH20 Plateau Pressure:  [13 cmH20-19 cmH20] 19 cmH20  INTAKE / OUTPUT: I/O last 3 completed shifts: In: 3040.1 [I.V.:1451.8; Blood:58.3; NG/GT:730; IV [id number]:357] Out: 0177 [Urine:20; Emesis/NG output:150; LTJQZ:0092; Stool:325]  PHYSICAL EXAMINATION: General: No distress. Eyes open. No family at bedside.  Integument:  Warm & dry. No rash on exposed skin. Abdominal dressing clean and dry. HEENT:  Endotracheal tube in place. Pupils equal. No scleral injection.  Cardiovascular:  Regular rate. Normal S1 & S2. No JVD. No edema.  Pulmonary:  Coarse breath sounds bilaterally unchanged. Symmetric chest wall rise on ventilator.  Abdomen: Soft. hypoactive bowel sounds. Abdominal dressing in place. Neurological:  Spontaneously moving all 4 extremities. Doesn't follow commands. Withdraws to pain in extremities.  LABS:  BMET  Recent Labs Lab 11/17/15 0310 11/17/15 1621 11/18/15 0415  NA 137 136 135  K 4.4 4.1 4.0  CL 105 106 106  CO2 _0 BUN 20 21* 20  CREATININE 1.19 1.20  1.07  GLUCOSE 105* 116* 118*    Electrolytes  Recent Labs Lab 11/20/2015 0410  11/16/15 0357 11/16/15 1615 11/17/15 0310 11/17/15 0315 11/17/15 1621 11/18/15 0415  CALCIUM 7.4*  < > 7.6* 7.5* 7.5*  --  7.3* 7.4*  MG 2.4  --  2.5*  --   --  2.4  --   --   PHOS 2.7  < > 2.8 3.0  --   --  2.9 2.9  < > = values in this interval not displayed.  CBC  Recent Labs Lab 11/17/15 0315 11/17/15 1821 11/18/15 0415  WBC 5.9 8.2 7.1  HGB 9.2* 9.6* 9.5*  HCT 28.4* 29.8* 29.9*  PLT 23* 26* 35*    Coag's  Recent Labs Lab 11/12/15 1341 11/14/15 0324 11/17/15 1821  APTT  --   --  34  INR 2.65* 1.38 1.42    Sepsis Markers No results for input(s): LATICACIDVEN, PROCALCITON, O2SATVEN in the last 168 hours.  ABG  Recent Labs Lab 11/16/2015 0159 11/14/15 0305 11/16/2015 1205  PHART 7.445 7.444 7.453*  PCO2ART 37.0 35.7 32.0*  PO2ART 94.0 56.0* 63.3*    Liver Enzymes  Recent Labs Lab 11/14/15 0324  11/16/15 0357  11/17/15 0310 11/17/15 1621 11/18/15 0415  AST 500*  --  278*  --  151*  --   --   ALT 491*  --  471*  --  327*  --   --   ALKPHOS 87  --  92  --  96  --   --   BILITOT 12.7*  --  12.3*  --  13.1*  --   --   ALBUMIN 2.1*  < > 2.2*  2.1*  < > 2.1* 2.1* 2.1*  < > = values in this interval not displayed.  Cardiac Enzymes  Recent Labs Lab 11/17/2015 0915 11/08/2015 1530  TROPONINI 25.27* 27.18*    Glucose  Recent Labs Lab 11/17/15 0313 11/17/15 1100 11/17/15 1501 11/17/15 1926 11/17/15 2319 11/18/15 0325  GLUCAP 99 129* 109* 105* 115* 111*    Imaging No results found.   STUDIES:  5/17 CT abd/pelvis >> pneumatosis SB 5/18 Echo >> EF 55 to 26%, grade 1 diastolic CHF, PAS 47 mmHg 5/22 Port CXR:  Improving right basilar opacity. No new opacity. Lines unchanged in position. 5/22 Port CXR: Endotracheal tube in good position. No new focal opacity or effusion appreciated.   CULTURES: 5/17 Urine >> E coli 5/17 Blood >> MRSA  5/21 Blood  >>  ANTIBIOTICS: 5/17 Vancomycin >> 5/17 Zosyn >> 5/18 Diflucan >>  SIGNIFICANT EVENTS: 5/17 Admit, surgery consulted 5/19 ID consulted, renal consulted >> start CRRT 5/22 Abdominal Closure  LINES/TUBES: 5/17 ETT >>  5/18 Lt IJ CVL >> 5/18 Lt femoral a line >> 5/19 Rt IJ HD cath >> 5/19 Lt [place] CVL >>  OGT 5/17 >> Foley 5/17 >>  ASSESSMENT / PLAN:  PULMONARY A: Acute Hypoxic Respiratory Failure  P:   Continue weaning FiO2 for saturation greater than 94%  Goal net negative fluid balance  SBT trials as mental status progresses  CARDIOVASCULAR A:  NSTEMI Shock - Resolved. Septic. H/O Pulmonary HTN  P:  Vitals per unit protocol Continue monitoring on telemetry  RENAL A:   Acute Renal Failure  P:   Nephrology following Continuing CRRT Trending electrolytes per Nephrology  GASTROINTESTINAL A:   Ischemic Small Bowel - S/P Laparotomy. Transaminitis - Improving. Protein calorie malnutrition. Hep C with cirrhosis Rectal bleeding - likely mucosal irritation from rectal tube in setting of thrombocytopenia.  P:   NPO Protonix VT daily Post op care per [facility] Advancing Trickle Tube Feedings per surgery recs Trending LFTs intermittently  HEMATOLOGIC A:   GI Bleeding - Likely post op & some oozing from rectal tube irritation in setting of thrombocytopenia. Coagulopathy - Mild & fibrinogen >100. Anemia - Hgb Stable. Thrombocytopenia - Likely combination of sepsis & hepatic cirrhosis. S/P 1u Platelets 5/24.  P:  Trending Cell Counts daily w/ CBC Trending Coags daily SCDs  INFECTIOUS A:   Septic Shock MRSA bacteremia. E coli UTI Peritonitis H/O HIV & Hep C - CD4 120 on 11/06/2015.  P:   Infectious Diseases Following Continuing Vancomycin Day #9, Fluconazole Day #8, & Zosyn Day #9 duration per their recommendations.  ENDOCRINE A:   Hyperglycemia - Possibly secondary to infection & steroids. Controlled. Possible Adrenal Insufficiency  P:    Accu-Checks q4hr Low dose SSI per algorithm D/C Hydrocortisone  NEUROLOGIC A:   Acute Encephalopathy - Likely metabolic vs Hepatic Encephalopathy.  P:   RASS Goal: 0 to -1 Fentanyl gtt & IV prn Versed IV prn Thiamine & FA IV Holding on Lactulose (Ammonia 39)  FAMILY UPDATE:  Mother updated by Dr. Ashok Cordia 5/25 via phone.  TODAY'S SUMMARY:  60 yo male with h/o IVDA, HIV, Hep C with cirrhosis, Pulmonary HTN. Now with ischemic SB with peritonitis, septic shock NSTEMI, AKI, VDRF, MRSA bacteremia after binge of illicit substances. Shock has now resolved. Discontinuing steroids today. Mother updated at  length via phone. Planning for family meeting early next week if mental status does not improve over the next few days.  I have spent a total of 47 minutes of critical care time today caring for the patient and reviewing the patient's electronic medical record.  Sonia Baller Ashok Cordia, M.D. [facility] Pulmonary & Critical Care Pager:  [phone] After 3pm or if no response, call [phone]  11/18/2015, 6:33 AM

## 2015-11-18 NOTE — Progress Notes (Signed)
Patient ID: Casey Pennington, male   DOB: [birth year], 58 y.o.   MRN: [id number]  [facility] Progress Note   Assessment/ Plan:   1. AKI: Anuric and multifactorial secondary to rhabdomyolysis, septic shock in the setting of HCV cirrhosis (+/- HRS). I will continue CRRT at the current prescription given his anuric state/absence of renal recovery. Based on physical exam, relative hypotension and CVP-will now start keeping him even on CRRT rather than ultrafiltration.  2. Ischemic bowel: Status post exploratory surgery/bowel resection 5 days ago with ileocolic anastomosis and fascial closure 3 days ago-on trickle tube feeds with further escalation for surgery. On anaerobic coverage with Zosyn and fungal coverage with fluconazole. 3. Hypocalcemia: Calcium 7.4, albumin 2.0 (corrected calcium within acceptable range).  4. MRSA sepsis: On antimicrobial coverage with vancomycin-blood cultures from 5/20 one negative to date. 5. Cirrhosis/hepatitis C/thrombocytopenia: With historical report of hepatitis C treated while incarcerated- still manifesting features of hepatic dysfunction. Unclear whether thrombocytopenia is from splenic sequestration/portal hypertension versus sepsis.  Subjective:   Episodically hypotensive overnight. With watery bowel movements following initiation of trickle tube feeds to    Objective:   BP 87/64 mmHg  Pulse 71  Temp(Src) 94.8 F (34.9 C) (Core (Comment))  Resp 19  Ht _0  (1.753 m)  Wt 62.6 kg (138 lb 0.1 oz)  BMI 20.37 kg/m2  SpO2 100%  Intake/Output Summary (Last 24 hours) at 11/18/15 0801 Last data filed at 11/18/15 0800  Gross per 24 hour  Intake 2525.84 ml  Output   3895 ml  Net -1369.16 ml   Weight change: -1.1 kg (-2 lb 6.8 oz)  Physical Exam: CBS:[id number], awake, not responsive to verbal commands CVS: Pulse regular rhythm, normal rate, S1-S2 with ESM Resp: Breath sounds are clear bilaterally-no rales/rhonchi Abd: Soft, flat, Abdominal  wound intact/dry Ext: No lower extremity edema  Imaging: No results found.  Labs: BMET  Recent Labs Lab 11/14/15 1527 11/01/2015 0410 11/14/2015 1600 11/16/15 0357 11/16/15 1615 11/17/15 0310 11/17/15 1621 11/18/15 0415  NA 136 137 136 138 138 137 136 135  K 4.2 4.6 4.4 4.5 4.4 4.4 4.1 4.0  CL 105 105 106 106 106 105 106 106  CO2 _1 GLUCOSE 131* 102* 122* 103* 114* 105* 116* 118*  BUN 28* 24* 24* 21* 20 20 21* 20  CREATININE 1.77* 1.48* 1.43* 1.16 1.18 1.19 1.20 1.07  CALCIUM 7.4* 7.4* 7.2* 7.6* 7.5* 7.5* 7.3* 7.4*  PHOS 3.5 2.7 3.2 2.8 3.0  --  2.9 2.9   CBC  Recent Labs Lab 11/08/2015 0200  11/16/15 0357 11/17/15 0315 11/17/15 1821 11/18/15 0415  WBC 6.9  < > 7.0 5.9 8.2 7.1  NEUTROABS 6.1  --   --   --   --   --   HGB 10.1*  < > 10.4* 9.2* 9.6* 9.5*  HCT 31.6*  < > 32.7* 28.4* 29.8* 29.9*  MCV 99.4  < > 100.3* 99.6 98.3 101.0*  PLT 25*  < > 35* 23* 26* 35*  < > = values in this interval not displayed.  Medications:    . antiseptic oral rinse  7 mL Mouth Rinse QID  . chlorhexidine gluconate (SAGE KIT)  15 mL Mouth Rinse BID  . fluconazole (DIFLUCAN) IV  400 mg Intravenous Q0600  . folic acid  1 mg Per Tube Daily  . insulin aspart  0-9 Units Subcutaneous Q4H  . pantoprazole sodium  40 mg Per Tube Daily  .  piperacillin-tazobactam (ZOSYN)  IV  2.25 g Intravenous Q6H  . sodium chloride flush  10-40 mL Intravenous Q12H  . sodium chloride flush  10-40 mL Intracatheter Q12H  . thiamine  100 mg Per Tube Daily  . vancomycin  1,000 mg Intravenous Q24H   Elmarie Shiley, MD 11/18/2015, 8:01 AM

## 2015-11-19 ENCOUNTER — Encounter: Payer: Self-pay | Admitting: Pulmonary Disease

## 2015-11-19 ENCOUNTER — Inpatient Hospital Stay (HOSPITAL_COMMUNITY): Payer: Medicaid Other

## 2015-11-19 LAB — CULTURE, BLOOD (ROUTINE X 2)
CULTURE: NO GROWTH
CULTURE: NO GROWTH

## 2015-11-19 LAB — PROTIME-INR
INR: 1.38 (ref 0.00–1.49)
PROTHROMBIN TIME: 17.1 s — AB (ref 11.6–15.2)

## 2015-11-19 LAB — RENAL FUNCTION PANEL
ALBUMIN: 2 g/dL — AB (ref 3.5–5.0)
ANION GAP: 9 (ref 5–15)
Albumin: 1.9 g/dL — ABNORMAL LOW (ref 3.5–5.0)
Anion gap: 8 (ref 5–15)
BUN: 19 mg/dL (ref 6–20)
BUN: 19 mg/dL (ref 6–20)
CALCIUM: 7.2 mg/dL — AB (ref 8.9–10.3)
CHLORIDE: 104 mmol/L (ref 101–111)
CO2: 24 mmol/L (ref 22–32)
CO2: 22 mmol/L (ref 22–32)
CREATININE: 1.29 mg/dL — AB (ref 0.61–1.24)
CREATININE: 1.3 mg/dL — AB (ref 0.61–1.24)
Calcium: 7.3 mg/dL — ABNORMAL LOW (ref 8.9–10.3)
Chloride: 106 mmol/L (ref 101–111)
GFR calc Af Amer: >60 mL/min (ref >60)
GFR calc non Af Amer: 60 mL/min — ABNORMAL LOW (ref >60)
GFR calc non Af Amer: 59 mL/min — ABNORMAL LOW (ref >60)
GLUCOSE: 108 mg/dL — AB (ref 65–99)
GLUCOSE: 106 mg/dL — AB (ref 65–99)
PHOSPHORUS: 2.9 mg/dL (ref 2.5–4.6)
POTASSIUM: 3.8 mmol/L (ref 3.5–5.1)
Phosphorus: 2.8 mg/dL (ref 2.5–4.6)
Potassium: 4.1 mmol/L (ref 3.5–5.1)
SODIUM: 136 mmol/L (ref 135–145)
Sodium: 137 mmol/L (ref 135–145)

## 2015-11-19 LAB — CBC
HEMATOCRIT: 30.9 % — AB (ref 39.0–52.0)
HEMOGLOBIN: 9.8 g/dL — AB (ref 13.0–17.0)
MCH: 31.7 pg (ref 26.0–34.0)
MCHC: 31.7 g/dL (ref 30.0–36.0)
MCV: 100 fL (ref 78.0–100.0)
Platelets: 42 10*3/uL — ABNORMAL LOW (ref 150–400)
RBC: 3.09 MIL/uL — ABNORMAL LOW (ref 4.22–5.81)
RDW: 17.7 % — AB (ref 11.5–15.5)
WBC: 9 10*3/uL (ref 4.0–10.5)

## 2015-11-19 LAB — GLUCOSE, CAPILLARY
GLUCOSE-CAPILLARY: 110 mg/dL — AB (ref 65–99)
GLUCOSE-CAPILLARY: 116 mg/dL — AB (ref 65–99)
Glucose-Capillary: 101 mg/dL — ABNORMAL HIGH (ref 65–99)
Glucose-Capillary: 110 mg/dL — ABNORMAL HIGH (ref 65–99)
Glucose-Capillary: 111 mg/dL — ABNORMAL HIGH (ref 65–99)
Glucose-Capillary: 126 mg/dL — ABNORMAL HIGH (ref 65–99)
Glucose-Capillary: 90 mg/dL (ref 65–99)

## 2015-11-19 LAB — MAGNESIUM: MAGNESIUM: 2.4 mg/dL (ref 1.7–2.4)

## 2015-11-19 LAB — APTT: APTT: 37 s (ref 24–37)

## 2015-11-19 MED ORDER — LAMIVUDINE 10 MG/ML PO SOLN
150.0000 mg | Freq: Every day | ORAL | Status: DC
  Administered 2015-11-19 – 2015-11-21 (×3): 150 mg
  Filled 2015-11-19 (×3): qty 15

## 2015-11-19 MED ORDER — SODIUM CHLORIDE 0.9 % IV BOLUS (SEPSIS)
500.0000 mL | Freq: Once | INTRAVENOUS | Status: AC
Start: 2015-11-19 — End: 2015-11-19
  Administered 2015-11-19: 500 mL via INTRAVENOUS

## 2015-11-19 MED ORDER — ABACAVIR SULFATE 300 MG PO TABS
600.0000 mg | ORAL_TABLET | Freq: Every day | ORAL | Status: DC
  Administered 2015-11-19 – 2015-11-22 (×4): 600 mg
  Filled 2015-11-19 (×4): qty 2

## 2015-11-19 MED ORDER — DEXMEDETOMIDINE HCL IN NACL 200 MCG/50ML IV SOLN
0.4000 ug/kg/h | INTRAVENOUS | Status: DC
  Administered 2015-11-19: 0.4 ug/kg/h via INTRAVENOUS
  Administered 2015-11-19: 1.2 ug/kg/h via INTRAVENOUS
  Administered 2015-11-20: 0.2 ug/kg/h via INTRAVENOUS
  Filled 2015-11-19 (×4): qty 50

## 2015-11-19 MED ORDER — DOLUTEGRAVIR SODIUM 50 MG PO TABS
50.0000 mg | ORAL_TABLET | Freq: Every day | ORAL | Status: DC
  Administered 2015-11-19 – 2015-11-22 (×4): 50 mg via ORAL
  Filled 2015-11-19 (×4): qty 1

## 2015-11-19 NOTE — Progress Notes (Addendum)
Patient ID: Casey Pennington, male   DOB: 1955/07/24, 60 y.o.   MRN: [id number]  [facility] Progress Note   Assessment/ Plan:   1. AKI: Anuric and multifactorial secondary to rhabdomyolysis, septic shock in the setting of HCV cirrhosis. Continue CRRT at the current prescription given his anuric state/absence of renal recovery. Continue keeping him even on CRRT based on volume assessment. Electrolytes acceptable. Will attempt transition to IHD when possible.   2. Ischemic bowel: Status post exploratory surgery/bowel resection 6 days ago with ileocolic anastomosis and fascial closure 4 days ago-on trickle tube feeds with further escalation for surgery. On ABx coverage with Zosyn, Vanco and fluconazole. 3. Hypocalcemia: Calcium 7.3, albumin 2.0 (corrected calcium within acceptable range).  4. MRSA sepsis: On antimicrobial coverage with vancomycin-blood cultures from 5/20 one negative to date. 5. Cirrhosis/hepatitis C/thrombocytopenia: With historical report of hepatitis C treated while incarcerated--negative HCV-RNA. S/P platelet transfusion for suspected hematochezia and lower Hgb/plts--- thrombocytopenia likely from splenic sequestration/portal hypertension versus sepsis.  Subjective:   No acute events overnight-- no problems with CRRT. Continues to have maroon stools.    Objective:   BP 82/62 mmHg  Pulse 85  Temp(Src) 98.1 F (36.7 C) (Core (Comment))  Resp 20  Ht 5' 9" (1.753 m)  Wt 60.5 kg (133 lb 6.1 oz)  BMI 19.69 kg/m2  SpO2 97%  Intake/Output Summary (Last 24 hours) at 11/19/15 5789 Last data filed at 11/19/15 0800  Gross per 24 hour  Intake 2631.78 ml  Output   2573 ml  Net  58.78 ml   Weight change: -2.1 kg (-4 lb 10.1 oz)  Physical Exam: BOE:[id number], awake, not responsive to verbal commands CVS: Pulse regular rhythm, normal rate, S1-S2 with ESM Resp: Breath sounds are clear bilaterally-no rales/rhonchi Abd: Soft, flat, Abdominal wound dressing  intact/dry Ext: No lower extremity edema  Imaging: No results found.  Labs: BMET  Recent Labs Lab 11/17/2015 1600 11/16/15 0357 11/16/15 1615 11/17/15 0310 11/17/15 1621 11/18/15 0415 11/18/15 1655 11/19/15 0335  NA 136 138 138 137 136 135 135 137  K 4.4 4.5 4.4 4.4 4.1 4.0 4.2 3.8  CL 106 106 106 105 106 106 106 104  CO2 _0 GLUCOSE 122* 103* 114* 105* 116* 118* 115* 108*  BUN 24* 21* 20 20 21* _1 CREATININE 1.43* 1.16 1.18 1.19 1.20 1.07 1.22 1.29*  CALCIUM 7.2* 7.6* 7.5* 7.5* 7.3* 7.4* 7.3* 7.3*  PHOS 3.2 2.8 3.0  --  2.9 2.9 2.8 2.9   CBC  Recent Labs Lab 10/28/2015 0200  11/17/15 0315 11/17/15 1821 11/18/15 0415 11/19/15 0335  WBC 6.9  < > 5.9 8.2 7.1 9.0  NEUTROABS 6.1  --   --   --   --   --   HGB 10.1*  < > 9.2* 9.6* 9.5* 9.8*  HCT 31.6*  < > 28.4* 29.8* 29.9* 30.9*  MCV 99.4  < > 99.6 98.3 101.0* 100.0  PLT 25*  < > 23* 26* 35* 42*  < > = values in this interval not displayed.  Medications:    . antiseptic oral rinse  7 mL Mouth Rinse QID  . chlorhexidine gluconate (SAGE KIT)  15 mL Mouth Rinse BID  . fluconazole (DIFLUCAN) IV  400 mg Intravenous Q0600  . folic acid  1 mg Per Tube Daily  . insulin aspart  0-9 Units Subcutaneous Q4H  . pantoprazole sodium  40 mg Per Tube Daily  .  piperacillin-tazobactam (ZOSYN)  IV  2.25 g Intravenous Q6H  . sodium chloride flush  10-40 mL Intravenous Q12H  . sodium chloride flush  10-40 mL Intracatheter Q12H  . sulfamethoxazole-trimethoprim  20 mL Per Tube Daily  . thiamine  100 mg Per Tube Daily  . vancomycin  1,000 mg Intravenous Q24H   Elmarie Shiley, MD 11/19/2015, 8:08 AM

## 2015-11-19 NOTE — Progress Notes (Signed)
[place] Progress Note Patient Name: Casey Pennington DOB: 03/17/1956 MRN: [id number]   Date of Service  11/19/2015  HPI/Events of Note  Low BP while on sedation CVP 8  eICU Interventions  500cc NS     Intervention Category Evaluation Type: Other  Mercie Balsley 11/19/2015, 7:48 PM

## 2015-11-19 NOTE — Progress Notes (Addendum)
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 Dennis Bast 11/18/2015 laparotomy, small bowel resection, open abdomen vac---Dr. Kieth Brightly 11/21/2015 exploratory laparotomy, ileocoic anastomosis, closure of abdomen---Dr. Ninfa Linden  Physical Exam: Constitutional:  Filed Vitals:   11/19/15 0900 11/19/15 1000  BP: 134/74 113/74  Pulse: 99 98  Temp: 98.6 F (37 C) 99 F (37.2 C)  Resp: 33 20   patient appears in NAD, eyes occasionally opened to name, jaundiced Eyes: icterus HENT: ET in place; left IJ, left subclavian, right dialysis cath Respiratory: Normal respiratory effort; CTA B, anterior exam Cardiovascular:  RRR, no g/m/r GI: absent bowel sounds  Review of Systems: Unable to be assessed due to mental status  Lab Results  Component Value Date   WBC 9.0 11/19/2015   HGB 9.8* 11/19/2015   HCT 30.9* 11/19/2015   MCV 100.0 11/19/2015   PLT 42* 11/19/2015    Lab Results  Component Value Date   CREATININE 1.29* 11/19/2015   BUN 19 11/19/2015   NA 137 11/19/2015   K 3.8 11/19/2015   CL 104 11/19/2015   CO2 24 11/19/2015    Lab Results  Component Value Date   ALT 215* 11/18/2015   AST 73* 11/18/2015   ALKPHOS 105 11/18/2015     Microbiology: Recent Results (from the past 240 hour(s))  Urine culture     Status: Abnormal   Collection Time: 11/08/2015  5:20 PM  Result Value Ref Range Status   Specimen Description URINE, CATHETERIZED  Final   Special Requests NONE  Final   Culture >=100,000 COLONIES/mL ESCHERICHIA COLI (A)  Final   Report Status 11/12/2015 FINAL  Final   Organism ID, Bacteria ESCHERICHIA COLI (A)  Final      Susceptibility   Escherichia coli - MIC*    AMPICILLIN <=2 SENSITIVE Sensitive     CEFAZOLIN <=4 SENSITIVE Sensitive     CEFTRIAXONE <=1 SENSITIVE Sensitive     CIPROFLOXACIN <=0.25 SENSITIVE Sensitive     GENTAMICIN <=1 SENSITIVE Sensitive     IMIPENEM <=0.25 SENSITIVE Sensitive     NITROFURANTOIN <=16 SENSITIVE Sensitive     TRIMETH/SULFA <=20 SENSITIVE Sensitive     AMPICILLIN/SULBACTAM <=2 SENSITIVE Sensitive     PIP/TAZO <=4 SENSITIVE Sensitive     * >=100,000 COLONIES/mL ESCHERICHIA COLI  Blood culture (routine x 2)     Status: Abnormal   Collection Time: 10/28/2015  8:45 PM  Result Value Ref Range Status   Specimen Description BLOOD RIGHT ARM  Final   Special Requests BOTTLES DRAWN AEROBIC AND ANAEROBIC 6CC  Final   Culture  Setup Time   Final    GRAM POSITIVE COCCI IN CLUSTERS RECOVERED FROM BOTH BOTTLES. Gram Stain Report Called to,Read Back By and Verified With: HARVEY,T. [facility]) AT 1157 ON 11/03/2015 BY BAUGHAM,M. Performed at [facility]    Culture (A)  Final    STAPHYLOCOCCUS AUREUS SUSCEPTIBILITIES PERFORMED ON PREVIOUS CULTURE WITHIN THE LAST 5 DAYS. Performed at [facility]    Report Status 11/06/2015 FINAL  Final  Blood culture (routine x 2)     Status: Abnormal   Collection Time: 11/14/2015  9:15 PM  Result Value Ref Range Status   Specimen Description BLOOD LEFT ARM  Final   Special Requests BOTTLES DRAWN AEROBIC AND ANAEROBIC 6CC  Final   Culture  Setup Time   Final    GRAM POSITIVE COCCI IN CLUSTERS RECOVERED [place] Gram Stain Report Called to,Read Back By and Verified With: HARVEY,T [facility]) AT 6712 ON 11/02/2015 BY BAUGHAM,M. Performed at [place] ID to follow CRITICAL RESULT CALLED TO, READ BACK BY AND VERIFIED WITH: R RUMBARGAR PHARMD 1800 10/27/2015 A BROWNING Performed at [place] (A)   Final   Report Status 11/03/2015 FINAL  Final   Organism ID, Bacteria METHICILLIN RESISTANT STAPHYLOCOCCUS AUREUS  Final      Susceptibility   Methicillin resistant staphylococcus aureus - MIC*    CIPROFLOXACIN >=8 RESISTANT Resistant     ERYTHROMYCIN >=8 RESISTANT Resistant     GENTAMICIN <=0.5 SENSITIVE Sensitive     OXACILLIN >=4 RESISTANT Resistant     TETRACYCLINE <=1 SENSITIVE Sensitive     VANCOMYCIN 1 SENSITIVE Sensitive     TRIMETH/SULFA <=10 SENSITIVE Sensitive     CLINDAMYCIN <=0.25 SENSITIVE Sensitive     RIFAMPIN <=0.5 SENSITIVE Sensitive     Inducible Clindamycin NEGATIVE Sensitive     * METHICILLIN RESISTANT STAPHYLOCOCCUS AUREUS  Blood Culture ID Panel (Reflexed)     Status: Abnormal   Collection Time: 10/27/2015  9:15 PM  Result Value Ref Range Status   Enterococcus species NOT DETECTED NOT DETECTED Final   Vancomycin resistance NOT DETECTED NOT DETECTED Final   Listeria monocytogenes NOT DETECTED NOT DETECTED Final   Staphylococcus species NOT DETECTED NOT DETECTED Final   Staphylococcus aureus DETECTED (A) NOT DETECTED Final    Comment: CRITICAL RESULT CALLED TO, READ BACK BY AND VERIFIED WITH: R RUMBARGAR PHARMD 1800 11/16/2015 A BROWNING    Methicillin resistance DETECTED (A) NOT DETECTED Final    Comment: CRITICAL RESULT CALLED TO, READ BACK BY AND VERIFIED WITH: R RUMBARGAR PHARMD 1800 11/08/2015 A BROWNING    Streptococcus species NOT DETECTED NOT DETECTED Final   Streptococcus agalactiae NOT DETECTED NOT DETECTED Final   Streptococcus pneumoniae NOT DETECTED NOT DETECTED Final   Streptococcus pyogenes NOT DETECTED NOT DETECTED Final   Acinetobacter baumannii NOT DETECTED NOT DETECTED Final   Enterobacteriaceae species NOT DETECTED NOT DETECTED Final   Enterobacter cloacae complex NOT DETECTED NOT DETECTED Final   Escherichia coli NOT DETECTED NOT DETECTED Final   Klebsiella oxytoca NOT DETECTED NOT DETECTED Final   Klebsiella pneumoniae NOT DETECTED NOT  DETECTED Final   Proteus species NOT DETECTED NOT DETECTED Final   Serratia marcescens NOT DETECTED NOT DETECTED Final   Carbapenem resistance NOT DETECTED NOT DETECTED Final   Haemophilus influenzae NOT DETECTED NOT DETECTED Final   Neisseria meningitidis NOT DETECTED NOT DETECTED Final   Pseudomonas aeruginosa NOT DETECTED NOT DETECTED Final   Candida albicans NOT DETECTED NOT DETECTED Final   Candida glabrata NOT DETECTED NOT DETECTED Final   Candida krusei NOT DETECTED NOT DETECTED Final   Candida parapsilosis NOT DETECTED NOT DETECTED Final   Candida tropicalis NOT DETECTED NOT DETECTED Final    Comment: Performed at [facility]  MRSA PCR Screening     Status: Abnormal   Collection Time: 11/18/2015  6:08 AM  Result Value Ref Range Status   MRSA by PCR POSITIVE (A) NEGATIVE Final    Comment:        The GeneXpert MRSA  Assay (FDA approved for NASAL specimens only), is one component of a comprehensive MRSA colonization surveillance program. It is not intended to diagnose MRSA infection nor to guide or monitor treatment for MRSA infections. RESULT CALLED TO, READ BACK BY AND VERIFIED WITH: Carma Leaven AT 8315 10/29/2015 BY K BARR   Culture, blood (routine x 2)     Status: None (Preliminary result)   Collection Time: 11/14/15  5:30 AM  Result Value Ref Range Status   Specimen Description BLOOD LEFT ANTECUBITAL  Final   Special Requests BOTTLES DRAWN AEROBIC ONLY 3CC  Final   Culture NO GROWTH 4 DAYS  Final   Report Status PENDING  Incomplete  Culture, blood (routine x 2)     Status: None (Preliminary result)   Collection Time: 11/14/15  6:35 AM  Result Value Ref Range Status   Specimen Description BLOOD RIGHT ARM  Final   Special Requests BOTTLES DRAWN AEROBIC ONLY 5CC  Final   Culture NO GROWTH 4 DAYS  Final   Report Status PENDING  Incomplete  Surgical pcr screen     Status: Abnormal   Collection Time: 11/14/15  8:06 PM  Result Value Ref Range Status   MRSA,  PCR POSITIVE (A) NEGATIVE Final    Comment: RESULT CALLED TO, READ BACK BY AND VERIFIED WITH: MSteffanie Dunn AT 0747 ON [id number] BY S. YARBROUGH    Staphylococcus aureus POSITIVE (A) NEGATIVE Final    Comment:        The Xpert SA Assay (FDA approved for NASAL specimens in patients over 58 years of age), is one component of a comprehensive surveillance program.  Test performance has been validated by [facility] for patients greater than or equal to 25 year old. It is not intended to diagnose infection nor to guide or monitor treatment.     Impression/Plan:  1. Sepsis - MRSA bacteremia - repeat blood cultures from 5/21 are no growth to date. Continue on vancomycin. Plan to treat miminum of 14 days using 5/21 as day 1 of mrsa tx  If stable, consider pulling triple lumen (placed in setting of bacateremia) and placing picc line  2.  Ischemic bowel with perforation = he has finished 7 d zosyn and fluconazole, since source control. will stop abtx and follow closely 3.  HIV - CD 4 count of 120 (in setting of acute illness)/VL 192,000. Has been off of treatment x 5-6 months. Will start back on tivicay, abacavir, lamivudine by peg-j. oi proph will give bactrim ds TIW 4. 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  Resultant thrombocytopenia = continue to monitor to see if ongoing spontaneous bleed 5. transaminitis =  continues to trend down.  6. Jaundice due to conjugated hyperbili (tbili 12) = will repeat hepatic panel to see if changing, hoping for improvement. Recommend to get RUQ 7. Hepatitis C - reportedly treated in jail. Viral load sent. Await for return 8. Thrombocytopenia - cirrhosis and acute sepsis, received 1 u PLT due to spontaneous bleeding. 9. aki = continue on CRRT  Dr Johnnye Sima to see over the weekend

## 2015-11-19 NOTE — Progress Notes (Signed)
4 Days Post-Op  Subjective: Tolerating TF.  Rectal tube, diarrhea.   Objective: Vital signs in last 24 hours: Temp:  [94.6 F (34.8 C)-98.6 F (37 C)] 97.2 F (36.2 C) (05/26 0700) Pulse Rate:  [66-143] 86 (05/26 0700) Resp:  [11-21] 20 (05/26 0700) BP: (79-133)/(56-82) 86/57 mmHg (05/26 0700) SpO2:  [94 %-100 %] 98 % (05/26 0700) Arterial Line BP: (82-134)/(44-71) 96/51 mmHg (05/26 0700) FiO2 (%):  [40 %] 40 % (05/26 0400) Weight:  [60.5 kg (133 lb 6.1 oz)] 60.5 kg (133 lb 6.1 oz) (05/26 0400) Last BM Date: 11/18/15  Intake/Output from previous day: 05/25 0701 - 05/26 0700 In: 2603.8 [I.V.:1013.8; NG/GT:990; IV Piggyback:600] Out: 2587 [Urine:6] Intake/Output this shift:    General appearance: NAD.  on vent.  GI: abdomen is soft, midline wound with intact fascia, clean, packing replaced.    Lab Results:   Recent Labs  11/18/15 0415 11/19/15 0335  WBC 7.1 9.0  HGB 9.5* 9.8*  HCT 29.9* 30.9*  PLT 35* 42*   BMET  Recent Labs  11/18/15 1655 11/19/15 0335  NA 135 137  K 4.2 3.8  CL 106 104  CO2 24 24  GLUCOSE 115* 108*  BUN 19 19  CREATININE 1.22 1.29*  CALCIUM 7.3* 7.3*   PT/INR  Recent Labs  11/17/15 1821 11/19/15 0335  LABPROT 17.4* 17.1*  INR 1.42 1.38   ABG No results for input(s): PHART, HCO3 in the last 72 hours.  Invalid input(s): PCO2, PO2  Studies/Results: No results found.  Anti-infectives: Anti-infectives    Start     Dose/Rate Route Frequency Ordered Stop   11/18/15 1430  sulfamethoxazole-trimethoprim (BACTRIM,SEPTRA) 200-40 MG/5ML suspension 20 mL     20 mL Per Tube Daily 11/18/15 1415     11/18/15 1000  vancomycin (VANCOCIN) IVPB 1000 mg/200 mL premix     1,000 mg 200 mL/hr over 60 Minutes Intravenous Every 24 hours 11/17/15 1239     11/18/15 0500  piperacillin-tazobactam (ZOSYN) IVPB 2.25 g     2.25 g 100 mL/hr over 30 Minutes Intravenous Every 6 hours 11/18/15 0453     11/14/2015 0600  fluconazole (DIFLUCAN) IVPB 400 mg      400 mg 100 mL/hr over 120 Minutes Intravenous Daily 11/14/15 0913     11/20/2015 1000  vancomycin (VANCOCIN) IVPB 750 mg/150 ml premix  Status:  Discontinued     750 mg 150 mL/hr over 60 Minutes Intravenous Every 24 hours 11/12/15 1343 11/17/15 1239   11/12/15 2100  vancomycin (VANCOCIN) IVPB 1000 mg/200 mL premix  Status:  Discontinued     1,000 mg 200 mL/hr over 60 Minutes Intravenous Every 48 hours 11/18/2015 0622 11/12/15 0940   11/12/15 1400  piperacillin-tazobactam (ZOSYN) IVPB 2.25 g  Status:  Discontinued     2.25 g 100 mL/hr over 30 Minutes Intravenous Every 6 hours 11/12/15 1343 11/18/15 0453   11/12/15 1030  vancomycin (VANCOCIN) 1,250 mg in sodium chloride 0.9 % 250 mL IVPB     1,250 mg 166.7 mL/hr over 90 Minutes Intravenous  Once 11/12/15 0940 11/12/15 1256   11/21/2015 2000  vancomycin (VANCOCIN) IVPB 750 mg/150 ml premix  Status:  Discontinued     750 mg 150 mL/hr over 60 Minutes Intravenous Every 24 hours 11/16/2015 2128 11/17/2015 0622   11/23/2015 1400  piperacillin-tazobactam (ZOSYN) IVPB 2.25 g  Status:  Discontinued     2.25 g 100 mL/hr over 30 Minutes Intravenous Every 8 hours 11/07/2015 0622 11/12/15 1343   11/03/2015 1000  abacavir-dolutegravir-lamiVUDine (TRIUMEQ) 600-50-300 MG per tablet 1 tablet  Status:  Discontinued     1 tablet Oral Daily 11/23/2015 2112 11/03/2015 0517   11/05/2015 0700  fluconazole (DIFLUCAN) IVPB 200 mg  Status:  Discontinued     200 mg 100 mL/hr over 60 Minutes Intravenous Daily 11/09/2015 0621 11/14/15 0913   11/09/2015 0600  piperacillin-tazobactam (ZOSYN) IVPB 3.375 g  Status:  Discontinued     3.375 g 12.5 mL/hr over 240 Minutes Intravenous Every 8 hours 10/27/2015 2128 11/01/2015 0622   11/03/2015 2130  vancomycin (VANCOCIN) IVPB 1000 mg/200 mL premix     1,000 mg 200 mL/hr over 60 Minutes Intravenous  Once 11/19/2015 2112 11/03/2015 2302   11/01/2015 2115  piperacillin-tazobactam (ZOSYN) IVPB 3.375 g     3.375 g 100 mL/hr over 30 Minutes Intravenous  Once  11/01/2015 2112 10/31/2015 2216   10/27/2015 2000  piperacillin-tazobactam (ZOSYN) IVPB 3.375 g  Status:  Discontinued    Comments:  Give as soon as cultures drawn   3.375 g 100 mL/hr over 30 Minutes Intravenous  Once 11/14/2015 1953 11/21/2015 2123   11/21/2015 2000  vancomycin (VANCOCIN) IVPB 1000 mg/200 mL premix  Status:  Discontinued    Comments:  Give as soon as cultures drawn   1,000 mg 200 mL/hr over 60 Minutes Intravenous  Once 10/25/2015 1953 11/24/2015 2123      Assessment/Plan: 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 Dennis Bast 11/18/2015 laparotomy, small bowel resection, open abdomen vac---Dr. Kieth Brightly 10/30/2015 exploratory laparotomy, ileocoic anastomosis, closure of abdomen---Dr. Ninfa Linden -TF at goal, continue BID wet to dry dressing changes ID-MRSA bacteremia, HIV, hep C, e coli UTI.  Infectious disease following resp-on vent Cirrhosis-hep C, EtOH Acute renal failure-CRRT, renal following CV-NSTEMI, stock   LOS: 9 days    Monterrio Gerst ANP-BC 11/19/2015 7:58 AM

## 2015-11-19 NOTE — Progress Notes (Signed)
PULMONARY / CRITICAL CARE MEDICINE   Name: Casey Pennington MRN: [id number] DOB: 04-01-1956    ADMISSION DATE:  11/12/2015  REFERRING MD:  ER  CHIEF COMPLAINT:  Altered mental status  SUBJECTIVE: Patient POD#4 post closure of abdomen. Patient had large emesis episode today after coughing episode.  REVIEW OF SYSTEMS: Unable to obtain as patient is intubated and sedated.   VITAL SIGNS: BP 98/81 mmHg  Pulse 86  Temp(Src) 96.1 F (35.6 C) (Core (Comment))  Resp 18  Ht _0  (1.753 m)  Wt 133 lb 6.1 oz (60.5 kg)  BMI 19.69 kg/m2  SpO2 100%  HEMODYNAMICS: CVP:  [5 mmHg-8 mmHg] 5 mmHg  VENTILATOR SETTINGS: Vent Mode:  [-] PRVC FiO2 (%):  [40 %] 40 % Set Rate:  [20 bmp] 20 bmp Vt Set:  [570 mL] 570 mL PEEP:  [5 cmH20] 5 cmH20 Pressure Support:  [5 cmH20] 5 cmH20 Plateau Pressure:  [15 cmH20-18 cmH20] 17 cmH20  INTAKE / OUTPUT: I/O last 3 completed shifts: In: 3720.3 [I.V.:1334.3; Blood:196; Other:50; NG/GT:1190; IV Piggyback:950] Out: 6004 [Urine:10; HTXHF:4142; Stool:900]  PHYSICAL EXAMINATION: General: No distress. Eyes open. No family at bedside.  Integument:  Warm & dry. No rash on exposed skin. Abdominal dressing clean and dry. HEENT:  Endotracheal tube in place. Pupils equal. No scleral injection.  Cardiovascular:  Regular rate. Normal S1 & S2. No JVD. No edema.  Pulmonary:  Coarse breath sounds bilaterally unchanged. Symmetric chest wall rise on ventilator.  Abdomen: Soft. hypoactive bowel sounds. Abdominal dressing in place. Neurological:  Spontaneously moving all 4 extremities. Doesn't follow commands. Withdraws to pain in extremities.  LABS:  BMET  Recent Labs Lab 11/18/15 0415 11/18/15 1655 11/19/15 0335  NA 135 135 137  K 4.0 4.2 3.8  CL 106 106 104  CO2 _1 BUN _2 CREATININE 1.07 1.22 1.29*  GLUCOSE 118* 115* 108*    Electrolytes  Recent Labs Lab 11/17/15 0315  11/18/15 0415 11/18/15 1655 11/19/15 0335  CALCIUM  --   < >  7.4* 7.3* 7.3*  MG 2.4  --  2.4  --  2.4  PHOS  --   < > 2.9 2.8 2.9  < > = values in this interval not displayed.  CBC  Recent Labs Lab 11/17/15 1821 11/18/15 0415 11/19/15 0335  WBC 8.2 7.1 9.0  HGB 9.6* 9.5* 9.8*  HCT 29.8* 29.9* 30.9*  PLT 26* 35* 42*    Coag's  Recent Labs Lab 11/14/15 0324 11/17/15 1821 11/19/15 0335  APTT  --  34 37  INR 1.38 1.42 1.38    Sepsis Markers No results for input(s): LATICACIDVEN, PROCALCITON, O2SATVEN in the last 168 hours.  ABG  Recent Labs Lab 11/09/2015 0159 11/14/15 0305 10/30/2015 1205  PHART 7.445 7.444 7.453*  PCO2ART 37.0 35.7 32.0*  PO2ART 94.0 56.0* 63.3*    Liver Enzymes  Recent Labs Lab 11/16/15 0357  11/17/15 0310  11/18/15 1430 11/18/15 1655 11/19/15 0335  AST 278*  --  151*  --  73*  --   --   ALT 471*  --  327*  --  215*  --   --   ALKPHOS 92  --  96  --  105  --   --   BILITOT 12.3*  --  13.1*  --  12.2*  --   --   ALBUMIN 2.2*  2.1*  < > 2.1*  < > 2.1* 1.9* 2.0*  < > = values in  this interval not displayed.  Cardiac Enzymes No results for input(s): TROPONINI, PROBNP in the last 168 hours.  Glucose  Recent Labs Lab 11/18/15 0759 11/18/15 1119 11/18/15 1529 11/18/15 1927 11/19/15 0001 11/19/15 0323  GLUCAP 108* 143* 106* 103* 110* 110*    Imaging No results found.   STUDIES:  5/17 CT abd/pelvis >> pneumatosis SB 5/18 Echo >> EF 55 to 29%, grade 1 diastolic CHF, PAS 47 mmHg 5/22 Port CXR:  Improving right basilar opacity. No new opacity. Lines unchanged in position. 5/22 Port CXR: Endotracheal tube in good position. No new focal opacity or effusion appreciated.   CULTURES: 5/17 Urine >> E coli 5/17 Blood >> MRSA  5/21 Blood >>  ANTIBIOTICS: 5/17 Vancomycin >> 5/17 Zosyn >> 5/18 Diflucan >> 5/25 Bactrim >>  SIGNIFICANT EVENTS: 5/17 Admit, surgery consulted 5/19 ID consulted, renal consulted >> start CRRT 5/22 Abdominal Closure  LINES/TUBES: 5/17 ETT >>  5/18 Lt IJ CVL  >> 5/18 Lt femoral a line >> 5/19 Rt IJ HD cath >> 5/19 Lt [place] CVL >>  OGT 5/17 >> Foley 5/17 >>  ASSESSMENT / PLAN:  PULMONARY A: Acute Hypoxic Respiratory Failure  P:   Continue weaning FiO2 for saturation greater than 94%  Goal net negative fluid balance  SBT trials as mental status progresses  CARDIOVASCULAR A:  NSTEMI Prolonged QTc - 572m 5/18. Shock - Resolved. Septic. H/O Pulmonary HTN  P:  Vitals per unit protocol Continue monitoring on telemetry Checking EKG  RENAL A:   Acute Renal Failure  P:   Nephrology following Continuing CRRT Trending electrolytes per Nephrology  GASTROINTESTINAL A:   Ischemic Small Bowel - S/P Laparotomy. Transaminitis - Improving. Protein calorie malnutrition. Hep C with cirrhosis Rectal bleeding - likely mucosal irritation from rectal tube in setting of thrombocytopenia. Emesis  P:   NPO Protonix VT daily Post op care per CCS Holding tube feedings Repeat LFTs in AM to trend Checking KUB  HEMATOLOGIC A:   GI Bleeding - Likely post op & some oozing from rectal tube irritation in setting of thrombocytopenia. Coagulopathy - Mild & fibrinogen >100. Anemia - Hgb Stable. Heme+ stool. Thrombocytopenia - Likely combination of sepsis & hepatic cirrhosis. S/P 1u Platelets 5/24. Improving.  P:  Trending Cell Counts daily w/ CBC Trending Coags daily SCDs  INFECTIOUS A:   Septic Shock MRSA bacteremia - Blood Ctx 5/21 negative. E coli UTI Peritonitis H/O HIV & Hep C - CD4 120 on 11/05/2015.  P:   Infectious Diseases Following Continuing Vancomycin Day #10 total & Day #6/14 per ID Fluconazole Day #9 & Zosyn Day #10 & plan to stop over the weekend per ID  ENDOCRINE A:   Hyperglycemia - Possibly secondary to infection & steroids. Controlled. Possible Adrenal Insufficiency  P:   Accu-Checks q4hr Low dose SSI per algorithm  NEUROLOGIC A:   Acute Encephalopathy - Metabolic vs Hepatic Encephalopathy vs Delirium. Not  improving.  P:   RASS Goal: 0 to -1 Fentanyl gtt & IV prn Versed IV prn Starting Precedex gtt to spare Fentanyl & Versed Thiamine IV FA VT Holding on Lactulose (Ammonia 39) Repeat Ammonia AM 5/27 Checking EKG in AM for possible Seroquel or Haldol  FAMILY UPDATE:  Mother & Father updated by Dr. NAshok Cordia5/26 at bedside.  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 Now with ischemic SB with peritonitis, septic shock NSTEMI, AKI, VDRF, MRSA bacteremia after binge of illicit substances. Shock has now  resolved. Starting Precedex in an effort to minimize potential delirium. Also checking EKG in the morning to see if QTC has normalized allowing the use of Seroquel and/or Haldol.  I have spent a total of 32 minutes of critical care time today caring for the patient and reviewing the patient's electronic medical record.  Sonia Baller Ashok Cordia, M.D. [facility] Pulmonary & Critical Care Pager:  [phone] After 3pm or if no response, call [phone]  11/19/2015, 6:52 AM

## 2015-11-20 LAB — RENAL FUNCTION PANEL
ALBUMIN: 1.7 g/dL — AB (ref 3.5–5.0)
ANION GAP: 7 (ref 5–15)
Albumin: 1.8 g/dL — ABNORMAL LOW (ref 3.5–5.0)
Anion gap: 5 (ref 5–15)
BUN: 15 mg/dL (ref 6–20)
BUN: 17 mg/dL (ref 6–20)
CO2: 25 mmol/L (ref 22–32)
CO2: 23 mmol/L (ref 22–32)
CREATININE: 1.13 mg/dL (ref 0.61–1.24)
Calcium: 7.5 mg/dL — ABNORMAL LOW (ref 8.9–10.3)
Calcium: 7.2 mg/dL — ABNORMAL LOW (ref 8.9–10.3)
Chloride: 105 mmol/L (ref 101–111)
Chloride: 106 mmol/L (ref 101–111)
Creatinine, Ser: 1.2 mg/dL (ref 0.61–1.24)
GFR calc Af Amer: >60 mL/min (ref >60)
GFR calc Af Amer: >60 mL/min (ref >60)
GFR calc non Af Amer: >60 mL/min (ref >60)
GLUCOSE: 127 mg/dL — AB (ref 65–99)
GLUCOSE: 106 mg/dL — AB (ref 65–99)
PHOSPHORUS: 2.9 mg/dL (ref 2.5–4.6)
PHOSPHORUS: 3 mg/dL (ref 2.5–4.6)
POTASSIUM: 4.5 mmol/L (ref 3.5–5.1)
POTASSIUM: 4.2 mmol/L (ref 3.5–5.1)
SODIUM: 134 mmol/L — AB (ref 135–145)
Sodium: 137 mmol/L (ref 135–145)

## 2015-11-20 LAB — PROTIME-INR
INR: 1.6 — ABNORMAL HIGH (ref 0.00–1.49)
PROTHROMBIN TIME: 19.1 s — AB (ref 11.6–15.2)

## 2015-11-20 LAB — HEPATIC FUNCTION PANEL
ALBUMIN: 1.8 g/dL — AB (ref 3.5–5.0)
ALK PHOS: 100 U/L (ref 38–126)
ALT: 128 U/L — ABNORMAL HIGH (ref 17–63)
AST: 48 U/L — ABNORMAL HIGH (ref 15–41)
Bilirubin, Direct: 8.3 mg/dL — ABNORMAL HIGH (ref 0.1–0.5)
Indirect Bilirubin: 5.2 mg/dL — ABNORMAL HIGH (ref 0.3–0.9)
TOTAL PROTEIN: 5.9 g/dL — AB (ref 6.5–8.1)
Total Bilirubin: 13.5 mg/dL — ABNORMAL HIGH (ref 0.3–1.2)

## 2015-11-20 LAB — CBC
HCT: 29.2 % — ABNORMAL LOW (ref 39.0–52.0)
HEMOGLOBIN: 9.3 g/dL — AB (ref 13.0–17.0)
MCH: 32 pg (ref 26.0–34.0)
MCHC: 31.8 g/dL (ref 30.0–36.0)
MCV: 100.3 fL — ABNORMAL HIGH (ref 78.0–100.0)
Platelets: 32 10*3/uL — ABNORMAL LOW (ref 150–400)
RBC: 2.91 MIL/uL — AB (ref 4.22–5.81)
RDW: 17.9 % — ABNORMAL HIGH (ref 11.5–15.5)
WBC: 11.3 10*3/uL — ABNORMAL HIGH (ref 4.0–10.5)

## 2015-11-20 LAB — GLUCOSE, CAPILLARY
GLUCOSE-CAPILLARY: 135 mg/dL — AB (ref 65–99)
GLUCOSE-CAPILLARY: 95 mg/dL (ref 65–99)
Glucose-Capillary: 108 mg/dL — ABNORMAL HIGH (ref 65–99)
Glucose-Capillary: 116 mg/dL — ABNORMAL HIGH (ref 65–99)
Glucose-Capillary: 97 mg/dL (ref 65–99)

## 2015-11-20 LAB — APTT: aPTT: 36 seconds (ref 24–37)

## 2015-11-20 LAB — AMMONIA: AMMONIA: 32 umol/L (ref 9–35)

## 2015-11-20 LAB — MAGNESIUM: Magnesium: 2.5 mg/dL — ABNORMAL HIGH (ref 1.7–2.4)

## 2015-11-20 MED ORDER — SODIUM CHLORIDE 0.9 % IV BOLUS (SEPSIS)
1000.0000 mL | Freq: Once | INTRAVENOUS | Status: AC
  Administered 2015-11-20: 1000 mL via INTRAVENOUS

## 2015-11-20 MED ORDER — SODIUM CHLORIDE 0.9 % IV SOLN: 1.0000 mg | Freq: Once | INTRAVENOUS | Status: DC

## 2015-11-20 MED ORDER — PANTOPRAZOLE SODIUM 40 MG PO PACK
40.0000 mg | PACK | Freq: Every day | ORAL | Status: DC
  Administered 2015-11-20 – 2015-11-22 (×3): 40 mg
  Filled 2015-11-20 (×3): qty 20

## 2015-11-20 MED ORDER — PANTOPRAZOLE SODIUM 40 MG IV SOLR: 40.0000 mg | Freq: Every day | INTRAVENOUS | Status: DC

## 2015-11-20 MED ORDER — FOLIC ACID 5 MG/ML IJ SOLN
1.0000 mg | Freq: Once | INTRAMUSCULAR | Status: AC
  Administered 2015-11-20: 1 mg via INTRAVENOUS
  Filled 2015-11-20: qty 0.2

## 2015-11-20 MED ORDER — THIAMINE HCL 100 MG/ML IJ SOLN
500.0000 mg | Freq: Three times a day (TID) | INTRAVENOUS | Status: DC
  Administered 2015-11-20 – 2015-11-22 (×7): 500 mg via INTRAVENOUS
  Filled 2015-11-20 (×9): qty 5

## 2015-11-20 NOTE — Progress Notes (Signed)
Pharmacy Antibiotic Note  Casey Pennington is a 60 y.o. male admitted on 10/29/2015 with MRSA bacteremia.  Patient continues on vancomycin. ART was restarted by ID.  Plan: Continue vancomycin 1g IV every 24 hours while pt on CRRT through 6/4 per ID Repeat VT Mon or when off CRRT  Height: _0  (175.3 cm) Weight: 136 lb 0.4 oz (61.7 kg) IBW/kg (Calculated) : 70.7  Temp (24hrs), Avg:97 F (36.1 C), Min:94.8 F (34.9 C), Max:99 F (37.2 C)   Recent Labs Lab 11/17/15 0315 11/17/15 0930  11/17/15 1821 11/18/15 0415 11/18/15 1655 11/19/15 0335 11/19/15 1545 11/20/15 0455  WBC 5.9  --   --  8.2 7.1  --  9.0  --  11.3*  CREATININE  --   --   < >  --  1.07 1.22 1.29* 1.30* 1.20  VANCOTROUGH  --  14  --   --   --   --   --   --   --   < > = values in this interval not displayed.  Estimated Creatinine Clearance: 58.6 mL/min (by C-G formula based on Cr of 1.2).    No Known Allergies  Antimicrobials this admission: 5/17 Vanc >> 5/17 Zosyn >>5/26 5/18 Diflucan >>5/26  Dose adjustments this admission: 5/19 Vanc random = 12 5/24 VT after 6 days CRRT = 14 (750 mg q24h)  Microbiology results: 5/17 BCx: 2/2 MRSA 5/17 UCx: ecoli (Pan-sensitive) 5/18 MRSA PCR: + 5/21 Blood x 2: ngx2d  Levester Fresh, PharmD, BCPS, [facility] Clinical Pharmacist Pager [phone] 11/20/2015 8:57 AM

## 2015-11-20 NOTE — Progress Notes (Signed)
PULMONARY / CRITICAL CARE MEDICINE   Name: Casey Pennington MRN: [id number] DOB: 11-07-1955    ADMISSION DATE:  11/12/2015  REFERRING MD:  ER  CHIEF COMPLAINT:  Altered mental status  SUBJECTIVE: Patient POD#5 post closure of abdomen. Patient had hypotension overnight treated with bolus IVF & withholding sedation. Patient had emesis yesterday & tube feedings were held because of ileus.  REVIEW OF SYSTEMS: Unable to obtain as patient is intubated and altered mentation.   VITAL SIGNS: BP 72/47 mmHg  Pulse 77  Temp(Src) 94.8 F (34.9 C) (Core (Comment))  Resp 22  Ht _0  (1.753 m)  Wt 136 lb 0.4 oz (61.7 kg)  BMI 20.08 kg/m2  SpO2 99%  HEMODYNAMICS: CVP:  [1 mmHg-3 mmHg] 3 mmHg  VENTILATOR SETTINGS: Vent Mode:  [-] PRVC FiO2 (%):  [40 %] 40 % Set Rate:  [20 bmp] 20 bmp Vt Set:  [570 mL] 570 mL PEEP:  [5 cmH20] 5 cmH20 Pressure Support:  [5 cmH20] 5 cmH20 Plateau Pressure:  [17 cmH20-21 cmH20] 17 cmH20  INTAKE / OUTPUT: I/O last 3 completed shifts: In: 3811.1 [I.V.:1506.4; NG/GT:1404.7; IV Piggyback:900] Out: 4417 [Urine:13; LWHKN:1836; Stool:400]  PHYSICAL EXAMINATION: General: No distress. No family present. Integument:  Warm & dry. No rash on exposed skin. Abdominal dressing clean and dry. HEENT:  Endotracheal tube in place. Pupils equal. No scleral injection.  Cardiovascular:  Regular rate. Normal S1 & S2. No JVD. No edema.  Pulmonary:  Coarse breath sounds bilaterally unchanged. Symmetric chest wall expansion on ventilator.  Abdomen: Soft. hypoactive bowel sounds. Abdominal dressing in place. Neurological:  Spontaneously moving all 4 extremities. Withdraws to pain. Not following commands. Seems to attend to voice.  LABS:  BMET  Recent Labs Lab 11/19/15 0335 11/19/15 1545 11/20/15 0455  NA 137 136 137  K 3.8 4.1 4.5  CL 104 106 105  CO2 _1 BUN _2 CREATININE 1.29* 1.30* 1.20  GLUCOSE 108* 106* 127*    Electrolytes  Recent Labs Lab  11/18/15 0415  11/19/15 0335 11/19/15 1545 11/20/15 0455  CALCIUM 7.4*  < > 7.3* 7.2* 7.5*  MG 2.4  --  2.4  --  2.5*  PHOS 2.9  < > 2.9 2.8 2.9  < > = values in this interval not displayed.  CBC  Recent Labs Lab 11/18/15 0415 11/19/15 0335 11/20/15 0455  WBC 7.1 9.0 11.3*  HGB 9.5* 9.8* 9.3*  HCT 29.9* 30.9* 29.2*  PLT 35* 42* 32*    Coag's  Recent Labs Lab 11/17/15 1821 11/19/15 0335 11/20/15 0455  APTT 34 37 36  INR 1.42 1.38 1.60*    Sepsis Markers No results for input(s): LATICACIDVEN, PROCALCITON, O2SATVEN in the last 168 hours.  ABG  Recent Labs Lab 11/14/15 0305 11/12/2015 1205  PHART 7.444 7.453*  PCO2ART 35.7 32.0*  PO2ART 56.0* 63.3*    Liver Enzymes  Recent Labs Lab 11/17/15 0310  11/18/15 1430  11/19/15 0335 11/19/15 1545 11/20/15 0455  AST 151*  --  73*  --   --   --  48*  ALT 327*  --  215*  --   --   --  128*  ALKPHOS 96  --  105  --   --   --  100  BILITOT 13.1*  --  12.2*  --   --   --  13.5*  ALBUMIN 2.1*  < > 2.1*  < > 2.0* 1.9* 1.8*  1.8*  < > = values  in this interval not displayed.  Cardiac Enzymes No results for input(s): TROPONINI, PROBNP in the last 168 hours.  Glucose  Recent Labs Lab 11/19/15 0001 11/19/15 0323 11/19/15 0742 11/19/15 1159 11/19/15 1923 11/19/15 2354  GLUCAP 110* 110* 101* 126* 90 116*    Imaging Dg Abd Portable 1v  11/19/2015  CLINICAL DATA:  Emesis EXAM: PORTABLE ABDOMEN - 1 VIEW COMPARISON:  10/31/2015 FINDINGS: Scattered large and small bowel gas is noted. Mild small bowel dilatation is seen. This is increased somewhat in the interval from the prior exam. This may represent a postoperative ileus. Correlation with the physical exam is recommended. A nasogastric catheter is seen within the stomach. No acute bony abnormality is noted. IMPRESSION: Small bowel dilatation likely representing postoperative ileus. Correlation with the physical exam is recommended. Electronically Signed   By: Inez Catalina M.D.   On: 11/19/2015 14:04     STUDIES:  5/17 CT abd/pelvis >> pneumatosis SB 5/18 Echo >> EF 55 to 78%, grade 1 diastolic CHF, PAS 47 mmHg 5/22 Port CXR:  Improving right basilar opacity. No new opacity. Lines unchanged in position. 5/22 Port CXR: Endotracheal tube in good position. No new focal opacity or effusion appreciated. 5/26 Port KUB: bowel gas patter with some dilation in small bowel consistent with ileus.   CULTURES: 5/17 Urine >> E coli 5/17 Blood >> MRSA  5/21 Blood >>  ANTIBIOTICS: 5/17 Vancomycin >> 5/17 Zosyn >> 5/18 Diflucan >> 5/25 Bactrim >>  SIGNIFICANT EVENTS: 5/17 Admit, surgery consulted 5/19 ID consulted, renal consulted >> start CRRT 5/22 Abdominal Closure  LINES/TUBES: 5/17 ETT >>  5/18 Lt IJ CVL >> 5/18 Lt femoral a line >> 5/19 Rt IJ HD cath >> 5/19 Lt [place] CVL >>  OGT 5/17 >> Foley 5/17 >>  ASSESSMENT / PLAN:  PULMONARY A: Acute Hypoxic Respiratory Failure  P:   Continue weaning FiO2 for saturation greater than 94%  Goal net negative fluid balance  SBT trials as mental status progresses  CARDIOVASCULAR A:  Intermittent Hypotension - Likely due to sedation. NSTEMI Prolonged QTc - 563m 5/18. 5280m5/26. Shock - Resolved. Septic. H/O Pulmonary HTN  P:  Vitals per unit protocol Continue monitoring on telemetry   RENAL A:   Acute Renal Failure  P:   Nephrology following Continuing CRRT Trending electrolytes per Nephrology  GASTROINTESTINAL A:   Ileus Ischemic Small Bowel - S/P Laparotomy. Transaminitis - Improving. Protein calorie malnutrition. Hep C with cirrhosis Rectal bleeding - Likely mucosal irritation from rectal tube in setting of thrombocytopenia. Resolved.  P:   NPO Protonix IV daily Post op care per CCS Holding tube feedings Trending LFTs daily  HEMATOLOGIC A:   GI Bleeding - Likely post op & some oozing from rectal tube irritation in setting of thrombocytopenia. Coagulopathy - Mild &  fibrinogen >100. Anemia - Hgb Stable. Heme+ stool. Thrombocytopenia - Likely combination of sepsis & hepatic cirrhosis. S/P 1u Platelets 5/24. Improving.  P:  Trending Cell Counts daily w/ CBC Trending Coags daily SCDs  INFECTIOUS A:   Septic Shock MRSA bacteremia - Blood Ctx 5/21 negative. E coli UTI Peritonitis H/O HIV & Hep C - CD4 120 on 11/23/2015.  P:   Infectious Diseases Following Continuing Vancomycin Day #10 total & Day #6/14 per ID Fluconazole Day #9 & Zosyn Day #10 & plan to stop over the weekend per ID  ENDOCRINE A:   Hyperglycemia - Possibly secondary to infection & steroids. Controlled. Possible Adrenal Insufficiency  P:   Accu-Checks  q4hr Low dose SSI per algorithm  NEUROLOGIC A:   Acute Encephalopathy - Metabolic vs Hepatic Encephalopathy vs Delirium. Not improving.  P:   RASS Goal: 0 to -1 Fentanyl gtt & IV prn Versed IV prn Stopping Precedex because it caused hypotension Thiamine 526m q8hr IV x3 days FA IV daily Holding on Lactulose (Ammonia 32) Repeat Ammonia AM 5/27 Unable to use Haldol/Seroquel/Zyprexa for QTc prolonged  FAMILY UPDATE:  Mother & Father updated by Dr. NAshok Cordia5/26 at bedside.  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 Now with ischemic SB with peritonitis, septic shock NSTEMI, AKI, VDRF, MRSA bacteremia after binge of illicit substances. Patient had hypotension after Precedex infusion. Precedex was discontinued today. Unable to use antipsychotics given prolonged QTc. Continuing high-dose IV thiamine. Plan for family discussion with son's on Monday. Likely will need to proceed with comfort care measures based on patient's previous wishes and previous discussions with his parents if he does not have significant improvement in his mental status.  I have spent a total of 36 minutes of critical care time today caring for the patient and reviewing the patient's electronic medical  record.  JSonia BallerNAshok Cordia M.D. [facility] Care[facility]ulmonary & Critical Care Pager:  [phone] 3pm or if no response, call [phone] 11/20/2015, 6:38 AM

## 2015-11-20 NOTE — Progress Notes (Signed)
Patient ID: Casey Pennington, male   DOB: 06-16-1956, 60 y.o.   MRN: [id number]  [facility] Progress Note   Assessment/ Plan:   1. AKI: Anuric and multifactorial secondary to rhabdomyolysis and septic shock. Continue CRRT at the current prescription given his anuric state/absence of renal recovery. Continue keeping him even on CRRT based on volume assessment. Electrolytes acceptable--we'll decide on transition to IHD as permitted by blood pressures/urine output. 2. Ischemic bowel: Status post exploratory surgery/bowel resection 6 days ago with ileocolic anastomosis and fascial closure 4 days ago-on trickle tube feeds with further escalation for surgery. On ABx coverage with Zosyn, Vanco and fluconazole. 3. Hypocalcemia: Calcium 7.5, albumin 2.0 (corrected calcium within acceptable range).  4. MRSA sepsis: On antimicrobial coverage with vancomycin-blood cultures from 5/21 negative to date. 5. Cirrhosis/hepatitis C/thrombocytopenia: With historical report of hepatitis C treated while incarcerated--negative HCV-RNA. S/P platelet transfusion for suspected hematochezia and lower Hgb/plts--- thrombocytopenia. 6. HIV infection: Management per infectious diseases team  Subjective:   Potential noted over night/early this morning requiring fluid boluses.    Objective:   BP 72/47 mmHg  Pulse 77  Temp(Src) 94.8 F (34.9 C) (Core (Comment))  Resp 22  Ht _0  (1.753 m)  Wt 61.7 kg (136 lb 0.4 oz)  BMI 20.08 kg/m2  SpO2 99%  Intake/Output Summary (Last 24 hours) at 11/20/15 0726 Last data filed at 11/20/15 0700  Gross per 24 hour  Intake 3149.19 ml  Output   2451 ml  Net 698.19 ml   Weight change: 1.2 kg (2 lb 10.3 oz)  Physical Exam: HEN:IDPOEUMPN, awake, not responsive to verbal commands CVS: Pulse regular rhythm, normal rate, S1-S2 with ESM Resp: Breath sounds are clear bilaterally-no rales/rhonchi Abd: Soft, flat, Abdominal wound dressing intact/dry Ext: No lower extremity  edema  Imaging: Dg Abd Portable 1v  11/19/2015  CLINICAL DATA:  Emesis EXAM: PORTABLE ABDOMEN - 1 VIEW COMPARISON:  10/29/2015 FINDINGS: Scattered large and small bowel gas is noted. Mild small bowel dilatation is seen. This is increased somewhat in the interval from the prior exam. This may represent a postoperative ileus. Correlation with the physical exam is recommended. A nasogastric catheter is seen within the stomach. No acute bony abnormality is noted. IMPRESSION: Small bowel dilatation likely representing postoperative ileus. Correlation with the physical exam is recommended. Electronically Signed   By: Inez Catalina M.D.   On: 11/19/2015 14:04    Labs: BMET  Recent Labs Lab 11/16/15 1615 11/17/15 0310 11/17/15 1621 11/18/15 0415 11/18/15 1655 11/19/15 0335 11/19/15 1545 11/20/15 0455  NA 138 137 136 135 135 137 136 137  K 4.4 4.4 4.1 4.0 4.2 3.8 4.1 4.5  CL 106 105 106 106 106 104 106 105  CO2 _1 GLUCOSE 114* 105* 116* 118* 115* 108* 106* 127*  BUN 20 20 21* _2 CREATININE 1.18 1.19 1.20 1.07 1.22 1.29* 1.30* 1.20  CALCIUM 7.5* 7.5* 7.3* 7.4* 7.3* 7.3* 7.2* 7.5*  PHOS 3.0  --  2.9 2.9 2.8 2.9 2.8 2.9   CBC  Recent Labs Lab 11/17/15 1821 11/18/15 0415 11/19/15 0335 11/20/15 0455  WBC 8.2 7.1 9.0 11.3*  HGB 9.6* 9.5* 9.8* 9.3*  HCT 29.8* 29.9* 30.9* 29.2*  MCV 98.3 101.0* 100.0 100.3*  PLT 26* 35* 42* 32*    Medications:    . abacavir  600 mg Per Tube Daily  . antiseptic oral rinse  7 mL Mouth Rinse QID  .  chlorhexidine gluconate (SAGE KIT)  15 mL Mouth Rinse BID  . dolutegravir  50 mg Oral Daily  . folic acid  1 mg Intravenous Once  . insulin aspart  0-9 Units Subcutaneous Q4H  . lamiVUDine  150 mg Per Tube Daily  . pantoprazole (PROTONIX) IV  40 mg Intravenous Daily  . sodium chloride flush  10-40 mL Intravenous Q12H  . sodium chloride flush  10-40 mL Intracatheter Q12H  . sulfamethoxazole-trimethoprim  20 mL Per Tube  Daily  . thiamine IV  500 mg Intravenous Q8H  . vancomycin  1,000 mg Intravenous Q24H   Elmarie Shiley, MD 11/20/2015, 7:26 AM

## 2015-11-20 NOTE — Progress Notes (Signed)
Patient ID: Casey Pennington, male   DOB: September 04, 1955, 60 y.o.   MRN: [id number]     [facility]      [address] [place], [place] [address]    Phone: [phone] FAX: [phone]     Subjective: Agitated.  WBC up, temps low.  On TF, recorded episode of emesis.   Having BMs.   Objective:  Vital signs:  Filed Vitals:   11/20/15 0328 11/20/15 0400 11/20/15 0500 11/20/15 0600  BP:  91/64 72/47   Pulse: 81 80 81 77  Temp:  95.7 F (35.4 C) 95.5 F (35.3 C) 94.8 F (34.9 C)  TempSrc:      Resp: 22     Height:      Weight:   61.7 kg (136 lb 0.4 oz)   SpO2: 100% 98% 96% 99%    Last BM Date: 11/19/15  Intake/Output   Yesterday:  05/26 0701 - 05/27 0700 In: 3149.2 [I.V.:904.5; NG/GT:444.7; IV Piggyback:1800] Out: 2472 [Urine:7; Stool:1200] This shift:    I/O last 3 completed shifts: In: 4553.5 [I.V.:1538.8; NG/GT:1014.7; IV Piggyback:2000] Out: 2458 [Urine:13; KDXIP:3825; Stool:1200]    Physical Exam: General: Pt agitated on the vent.   Abdomen: Soft.  Nondistended.  Dressing is c/d/i.   No evidence of peritonitis.  No incarcerated hernias.    Problem List:   Principal Problem:   Rhabdomyolysis Active Problems:   HIV disease ([place])   Pulmonary HTN ([place])   Chronic pain   BPH (benign prostatic hyperplasia)   Cirrhosis of liver without ascites ([facility])   Chronic hepatitis C ([place])   AKI (acute kidney injury) ([place])   Drug intoxication ([place])   Polysubstance abuse   Thrombocytopenia ([facility])   Relative polycythemia   Pneumatosis intestinalis   Septic shock (H[facility])   Acute respiratory failure ([place])    Results:   Labs: Results for orders placed or performed during the hospital encounter of 10/31/2015 (from the past 48 hour(s))  Glucose, capillary     Status: Abnormal   Collection Time: 11/18/15  7:59 AM  Result Value Ref Range   Glucose-Capillary 108 (H) 65 - 99 mg/dL  Occult blood card to lab, stool RN will collect      Status: Abnormal   Collection Time: 11/18/15 11:00 AM  Result Value Ref Range   Fecal Occult Bld POSITIVE (A) NEGATIVE  Glucose, capillary     Status: Abnormal   Collection Time: 11/18/15 11:19 AM  Result Value Ref Range   Glucose-Capillary 143 (H) 65 - 99 mg/dL  Hepatic function panel     Status: Abnormal   Collection Time: 11/18/15  2:30 PM  Result Value Ref Range   Total Protein 6.2 (L) 6.5 - 8.1 g/dL   Albumin 2.1 (L) 3.5 - 5.0 g/dL   AST 73 (H) 15 - 41 U/L   ALT 215 (H) 17 - 63 U/L   Alkaline Phosphatase 105 38 - 126 U/L   Total Bilirubin 12.2 (H) 0.3 - 1.2 mg/dL   Bilirubin, Direct 7.5 (H) 0.1 - 0.5 mg/dL   Indirect Bilirubin 4.7 (H) 0.3 - 0.9 mg/dL  Glucose, capillary     Status: Abnormal   Collection Time: 11/18/15  3:29 PM  Result Value Ref Range   Glucose-Capillary 106 (H) 65 - 99 mg/dL  Renal function panel (daily at 1600)     Status: Abnormal   Collection Time: 11/18/15  4:55 PM  Result Value Ref Range   Sodium 135 135 -  145 mmol/L   Potassium 4.2 3.5 - 5.1 mmol/L   Chloride 106 101 - 111 mmol/L   CO2 24 22 - 32 mmol/L   Glucose, Bld 115 (H) 65 - 99 mg/dL   BUN 19 6 - 20 mg/dL   Creatinine, Ser 1.22 0.61 - 1.24 mg/dL   Calcium 7.3 (L) 8.9 - 10.3 mg/dL   Phosphorus 2.8 2.5 - 4.6 mg/dL   Albumin 1.9 (L) 3.5 - 5.0 g/dL   GFR calc non Af Amer >60 >60 mL/min   GFR calc Af Amer >60 >60 mL/min    Comment: (NOTE) The eGFR has been calculated using the CKD EPI equation. This calculation has not been validated in all clinical situations. eGFR's persistently <60 mL/min signify possible Chronic Kidney Disease.    Anion gap 5 5 - 15  Glucose, capillary     Status: Abnormal   Collection Time: 11/18/15  7:27 PM  Result Value Ref Range   Glucose-Capillary 103 (H) 65 - 99 mg/dL  Glucose, capillary     Status: Abnormal   Collection Time: 11/19/15 12:01 AM  Result Value Ref Range   Glucose-Capillary 110 (H) 65 - 99 mg/dL  Glucose, capillary     Status: Abnormal    Collection Time: 11/19/15  3:23 AM  Result Value Ref Range   Glucose-Capillary 110 (H) 65 - 99 mg/dL  Renal function panel (daily at 0500)     Status: Abnormal   Collection Time: 11/19/15  3:35 AM  Result Value Ref Range   Sodium 137 135 - 145 mmol/L   Potassium 3.8 3.5 - 5.1 mmol/L   Chloride 104 101 - 111 mmol/L   CO2 24 22 - 32 mmol/L   Glucose, Bld 108 (H) 65 - 99 mg/dL   BUN 19 6 - 20 mg/dL   Creatinine, Ser 1.29 (H) 0.61 - 1.24 mg/dL   Calcium 7.3 (L) 8.9 - 10.3 mg/dL   Phosphorus 2.9 2.5 - 4.6 mg/dL   Albumin 2.0 (L) 3.5 - 5.0 g/dL   GFR calc non Af Amer 60 (L) >60 mL/min   GFR calc Af Amer >60 >60 mL/min    Comment: (NOTE) The eGFR has been calculated using the CKD EPI equation. This calculation has not been validated in all clinical situations. eGFR's persistently <60 mL/min signify possible Chronic Kidney Disease.    Anion gap 9 5 - 15  Magnesium     Status: None   Collection Time: 11/19/15  3:35 AM  Result Value Ref Range   Magnesium 2.4 1.7 - 2.4 mg/dL  CBC     Status: Abnormal   Collection Time: 11/19/15  3:35 AM  Result Value Ref Range   WBC 9.0 4.0 - 10.5 K/uL   RBC 3.09 (L) 4.22 - 5.81 MIL/uL   Hemoglobin 9.8 (L) 13.0 - 17.0 g/dL   HCT 30.9 (L) 39.0 - 52.0 %   MCV 100.0 78.0 - 100.0 fL   MCH 31.7 26.0 - 34.0 pg   MCHC 31.7 30.0 - 36.0 g/dL   RDW 17.7 (H) 11.5 - 15.5 %   Platelets 42 (L) 150 - 400 K/uL    Comment: CONSISTENT WITH PREVIOUS RESULT  Protime-INR     Status: Abnormal   Collection Time: 11/19/15  3:35 AM  Result Value Ref Range   Prothrombin Time 17.1 (H) 11.6 - 15.2 seconds   INR 1.38 0.00 - 1.49  APTT     Status: None   Collection Time: 11/19/15  3:35 AM  Result  Value Ref Range   aPTT 37 24 - 37 seconds    Comment:        IF BASELINE aPTT IS ELEVATED, SUGGEST PATIENT RISK ASSESSMENT BE USED TO DETERMINE APPROPRIATE ANTICOAGULANT THERAPY.   Glucose, capillary     Status: Abnormal   Collection Time: 11/19/15  7:42 AM  Result Value  Ref Range   Glucose-Capillary 101 (H) 65 - 99 mg/dL  Glucose, capillary     Status: Abnormal   Collection Time: 11/19/15 11:59 AM  Result Value Ref Range   Glucose-Capillary 126 (H) 65 - 99 mg/dL  Renal function panel (daily at 1600)     Status: Abnormal   Collection Time: 11/19/15  3:45 PM  Result Value Ref Range   Sodium 136 135 - 145 mmol/L   Potassium 4.1 3.5 - 5.1 mmol/L   Chloride 106 101 - 111 mmol/L   CO2 22 22 - 32 mmol/L   Glucose, Bld 106 (H) 65 - 99 mg/dL   BUN 19 6 - 20 mg/dL   Creatinine, Ser 1.30 (H) 0.61 - 1.24 mg/dL   Calcium 7.2 (L) 8.9 - 10.3 mg/dL   Phosphorus 2.8 2.5 - 4.6 mg/dL   Albumin 1.9 (L) 3.5 - 5.0 g/dL   GFR calc non Af Amer 59 (L) >60 mL/min   GFR calc Af Amer >60 >60 mL/min    Comment: (NOTE) The eGFR has been calculated using the CKD EPI equation. This calculation has not been validated in all clinical situations. eGFR's persistently <60 mL/min signify possible Chronic Kidney Disease.    Anion gap 8 5 - 15  Glucose, capillary     Status: None   Collection Time: 11/19/15  7:23 PM  Result Value Ref Range   Glucose-Capillary 90 65 - 99 mg/dL  Glucose, capillary     Status: Abnormal   Collection Time: 11/19/15 11:54 PM  Result Value Ref Range   Glucose-Capillary 116 (H) 65 - 99 mg/dL  Ammonia     Status: None   Collection Time: 11/20/15  4:54 AM  Result Value Ref Range   Ammonia 32 9 - 35 umol/L  Renal function panel (daily at 0500)     Status: Abnormal   Collection Time: 11/20/15  4:55 AM  Result Value Ref Range   Sodium 137 135 - 145 mmol/L   Potassium 4.5 3.5 - 5.1 mmol/L   Chloride 105 101 - 111 mmol/L   CO2 25 22 - 32 mmol/L   Glucose, Bld 127 (H) 65 - 99 mg/dL   BUN 15 6 - 20 mg/dL   Creatinine, Ser 1.20 0.61 - 1.24 mg/dL   Calcium 7.5 (L) 8.9 - 10.3 mg/dL   Phosphorus 2.9 2.5 - 4.6 mg/dL   Albumin 1.8 (L) 3.5 - 5.0 g/dL   GFR calc non Af Amer >60 >60 mL/min   GFR calc Af Amer >60 >60 mL/min    Comment: (NOTE) The eGFR has  been calculated using the CKD EPI equation. This calculation has not been validated in all clinical situations. eGFR's persistently <60 mL/min signify possible Chronic Kidney Disease.    Anion gap 7 5 - 15  Magnesium     Status: Abnormal   Collection Time: 11/20/15  4:55 AM  Result Value Ref Range   Magnesium 2.5 (H) 1.7 - 2.4 mg/dL  CBC     Status: Abnormal   Collection Time: 11/20/15  4:55 AM  Result Value Ref Range   WBC 11.3 (H) 4.0 - 10.5 K/uL  RBC 2.91 (L) 4.22 - 5.81 MIL/uL   Hemoglobin 9.3 (L) 13.0 - 17.0 g/dL   HCT 29.2 (L) 39.0 - 52.0 %   MCV 100.3 (H) 78.0 - 100.0 fL   MCH 32.0 26.0 - 34.0 pg   MCHC 31.8 30.0 - 36.0 g/dL   RDW 17.9 (H) 11.5 - 15.5 %   Platelets 32 (L) 150 - 400 K/uL    Comment: CONSISTENT WITH PREVIOUS RESULT  Protime-INR     Status: Abnormal   Collection Time: 11/20/15  4:55 AM  Result Value Ref Range   Prothrombin Time 19.1 (H) 11.6 - 15.2 seconds   INR 1.60 (H) 0.00 - 1.49  APTT     Status: None   Collection Time: 11/20/15  4:55 AM  Result Value Ref Range   aPTT 36 24 - 37 seconds  Hepatic function panel     Status: Abnormal   Collection Time: 11/20/15  4:55 AM  Result Value Ref Range   Total Protein 5.9 (L) 6.5 - 8.1 g/dL   Albumin 1.8 (L) 3.5 - 5.0 g/dL   AST 48 (H) 15 - 41 U/L   ALT 128 (H) 17 - 63 U/L   Alkaline Phosphatase 100 38 - 126 U/L   Total Bilirubin 13.5 (H) 0.3 - 1.2 mg/dL   Bilirubin, Direct 8.3 (H) 0.1 - 0.5 mg/dL   Indirect Bilirubin 5.2 (H) 0.3 - 0.9 mg/dL    Imaging / Studies: Dg Abd Portable 1v  11/19/2015  CLINICAL DATA:  Emesis EXAM: PORTABLE ABDOMEN - 1 VIEW COMPARISON:  11/02/2015 FINDINGS: Scattered large and small bowel gas is noted. Mild small bowel dilatation is seen. This is increased somewhat in the interval from the prior exam. This may represent a postoperative ileus. Correlation with the physical exam is recommended. A nasogastric catheter is seen within the stomach. No acute bony abnormality is noted.  IMPRESSION: Small bowel dilatation likely representing postoperative ileus. Correlation with the physical exam is recommended. Electronically Signed   By: Inez Catalina M.D.   On: 11/19/2015 14:04    Medications / Allergies:  Scheduled Meds: . abacavir  600 mg Per Tube Daily  . antiseptic oral rinse  7 mL Mouth Rinse QID  . chlorhexidine gluconate (SAGE KIT)  15 mL Mouth Rinse BID  . dolutegravir  50 mg Oral Daily  . folic acid  1 mg Intravenous Once  . insulin aspart  0-9 Units Subcutaneous Q4H  . lamiVUDine  150 mg Per Tube Daily  . pantoprazole (PROTONIX) IV  40 mg Intravenous Daily  . sodium chloride flush  10-40 mL Intravenous Q12H  . sodium chloride flush  10-40 mL Intracatheter Q12H  . sulfamethoxazole-trimethoprim  20 mL Per Tube Daily  . thiamine IV  500 mg Intravenous Q8H  . vancomycin  1,000 mg Intravenous Q24H   Continuous Infusions: . dexmedetomidine 0.3 mcg/kg/hr (11/20/15 0700)  . feeding supplement (VITAL AF 1.2 CAL) Stopped (11/19/15 1252)  . fentaNYL infusion INTRAVENOUS 75 mcg/hr (11/20/15 0700)  . midazolam (VERSED) infusion 1 mg/hr (11/20/15 0700)  . norepinephrine (LEVOPHED) Adult infusion Stopped (11/02/2015 1800)  . dialysis replacement fluid (prismasate) 1,000 mL/hr at 11/20/15 0510  . dialysis replacement fluid (prismasate) 400 mL/hr at 11/19/15 1911  . dialysate (PRISMASATE) 1,500 mL/hr at 11/20/15 0608   PRN Meds:.sodium chloride, fentaNYL, heparin, heparin, midazolam, [DISCONTINUED] ondansetron **OR** ondansetron (ZOFRAN) IV, sodium chloride, sodium chloride flush  Antibiotics: Anti-infectives    Start     Dose/Rate Route Frequency Ordered Stop  11/19/15 1200  lamiVUDine (EPIVIR) 10 MG/ML solution 150 mg     150 mg Per Tube Daily 11/19/15 1048     11/19/15 1200  abacavir (ZIAGEN) tablet 600 mg     600 mg Per Tube Daily 11/19/15 1048     11/19/15 1200  dolutegravir (TIVICAY) tablet 50 mg     50 mg Oral Daily 11/19/15 1048     11/18/15 1430   sulfamethoxazole-trimethoprim (BACTRIM,SEPTRA) 200-40 MG/5ML suspension 20 mL     20 mL Per Tube Daily 11/18/15 1415     11/18/15 1000  vancomycin (VANCOCIN) IVPB 1000 mg/200 mL premix     1,000 mg 200 mL/hr over 60 Minutes Intravenous Every 24 hours 11/17/15 1239     11/18/15 0500  piperacillin-tazobactam (ZOSYN) IVPB 2.25 g  Status:  Discontinued     2.25 g 100 mL/hr over 30 Minutes Intravenous Every 6 hours 11/18/15 0453 11/19/15 1035   11/05/2015 0600  fluconazole (DIFLUCAN) IVPB 400 mg  Status:  Discontinued     400 mg 100 mL/hr over 120 Minutes Intravenous Daily 11/14/15 0913 11/19/15 1035   10/31/2015 1000  vancomycin (VANCOCIN) IVPB 750 mg/150 ml premix  Status:  Discontinued     750 mg 150 mL/hr over 60 Minutes Intravenous Every 24 hours 11/12/15 1343 11/17/15 1239   11/12/15 2100  vancomycin (VANCOCIN) IVPB 1000 mg/200 mL premix  Status:  Discontinued     1,000 mg 200 mL/hr over 60 Minutes Intravenous Every 48 hours 11/23/2015 0622 11/12/15 0940   11/12/15 1400  piperacillin-tazobactam (ZOSYN) IVPB 2.25 g  Status:  Discontinued     2.25 g 100 mL/hr over 30 Minutes Intravenous Every 6 hours 11/12/15 1343 11/18/15 0453   11/12/15 1030  vancomycin (VANCOCIN) 1,250 mg in sodium chloride 0.9 % 250 mL IVPB     1,250 mg 166.7 mL/hr over 90 Minutes Intravenous  Once 11/12/15 0940 11/12/15 1256   11/24/2015 2000  vancomycin (VANCOCIN) IVPB 750 mg/150 ml premix  Status:  Discontinued     750 mg 150 mL/hr over 60 Minutes Intravenous Every 24 hours 10/30/2015 2128 11/02/2015 0622   11/18/2015 1400  piperacillin-tazobactam (ZOSYN) IVPB 2.25 g  Status:  Discontinued     2.25 g 100 mL/hr over 30 Minutes Intravenous Every 8 hours 11/24/2015 0622 11/12/15 1343   11/20/2015 1000  abacavir-dolutegravir-lamiVUDine (TRIUMEQ) 600-50-300 MG per tablet 1 tablet  Status:  Discontinued     1 tablet Oral Daily 10/31/2015 2112 11/08/2015 0517   11/02/2015 0700  fluconazole (DIFLUCAN) IVPB 200 mg  Status:  Discontinued     200  mg 100 mL/hr over 60 Minutes Intravenous Daily 10/27/2015 0621 11/14/15 0913   10/29/2015 0600  piperacillin-tazobactam (ZOSYN) IVPB 3.375 g  Status:  Discontinued     3.375 g 12.5 mL/hr over 240 Minutes Intravenous Every 8 hours 11/17/2015 2128 11/19/2015 0622   10/27/2015 2130  vancomycin (VANCOCIN) IVPB 1000 mg/200 mL premix     1,000 mg 200 mL/hr over 60 Minutes Intravenous  Once 11/09/2015 2112 10/28/2015 2302   11/21/2015 2115  piperacillin-tazobactam (ZOSYN) IVPB 3.375 g     3.375 g 100 mL/hr over 30 Minutes Intravenous  Once 11/12/2015 2112 11/02/2015 2216   10/28/2015 2000  piperacillin-tazobactam (ZOSYN) IVPB 3.375 g  Status:  Discontinued    Comments:  Give as soon as cultures drawn   3.375 g 100 mL/hr over 30 Minutes Intravenous  Once 11/05/2015 1953 10/31/2015 2123   11/14/2015 2000  vancomycin (VANCOCIN) IVPB 1000 mg/200 mL premix  Status:  Discontinued    Comments:  Give as soon as cultures drawn   1,000 mg 200 mL/hr over 60 Minutes Intravenous  Once 10/29/2015 1953 11/17/2015 2123        Assessment/Plan: 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 Dennis Bast 10/26/2015 laparotomy, small bowel resection, open abdomen vac---Dr. Kieth Brightly 10/28/2015 exploratory laparotomy, ileocoic anastomosis, closure of abdomen---Dr. Ninfa Linden -TF at goal, continue BID wet to dry dressing changes ID-MRSA bacteremia, HIV, hep C, e coli UTI. Infectious disease following resp-on vent Cirrhosis-hep C, EtOH Acute renal failure-CRRT, renal following CV-NSTEMI, stock  USAA, ANP-[facility]   11/20/2015 7:46 AM

## 2015-11-20 NOTE — Progress Notes (Signed)
[facility] Progress Note Patient Name: EMAD BRECHTEL DOB: Apr 12, 1956 MRN: [id number]   Date of Service  11/20/2015  HPI/Events of Note  Hypotension - likely d/t sedation.  eICU Interventions  Will order: 1. Hold sedation until BP is improved. 2. 0.9 NaCl 1 liter IV over 1 hour now.      Intervention Category Intermediate Interventions: Hypotension - evaluation and management  Sommer,Izyan Eugene 11/20/2015, 6:20 AM

## 2015-11-21 DIAGNOSIS — R7881 Bacteremia: Secondary | ICD-10-CM

## 2015-11-21 DIAGNOSIS — G9340 Encephalopathy, unspecified: Secondary | ICD-10-CM | POA: Insufficient documentation

## 2015-11-21 DIAGNOSIS — Z8679 Personal history of other diseases of the circulatory system: Secondary | ICD-10-CM

## 2015-11-21 DIAGNOSIS — Z8619 Personal history of other infectious and parasitic diseases: Secondary | ICD-10-CM

## 2015-11-21 DIAGNOSIS — T796XXD Traumatic ischemia of muscle, subsequent encounter: Secondary | ICD-10-CM

## 2015-11-21 DIAGNOSIS — B9562 Methicillin resistant Staphylococcus aureus infection as the cause of diseases classified elsewhere: Secondary | ICD-10-CM

## 2015-11-21 LAB — RENAL FUNCTION PANEL
ALBUMIN: 1.7 g/dL — AB (ref 3.5–5.0)
ANION GAP: 6 (ref 5–15)
Albumin: 1.7 g/dL — ABNORMAL LOW (ref 3.5–5.0)
Anion gap: 6 (ref 5–15)
BUN: 16 mg/dL (ref 6–20)
BUN: 14 mg/dL (ref 6–20)
CALCIUM: 7.4 mg/dL — AB (ref 8.9–10.3)
CHLORIDE: 105 mmol/L (ref 101–111)
CO2: 26 mmol/L (ref 22–32)
CO2: 24 mmol/L (ref 22–32)
Calcium: 7.5 mg/dL — ABNORMAL LOW (ref 8.9–10.3)
Chloride: 105 mmol/L (ref 101–111)
Creatinine, Ser: 1.03 mg/dL (ref 0.61–1.24)
Creatinine, Ser: 0.92 mg/dL (ref 0.61–1.24)
GFR calc Af Amer: >60 mL/min (ref >60)
GFR calc non Af Amer: >60 mL/min (ref >60)
Glucose, Bld: 82 mg/dL (ref 65–99)
Glucose, Bld: 94 mg/dL (ref 65–99)
PHOSPHORUS: 2.9 mg/dL (ref 2.5–4.6)
POTASSIUM: 4.3 mmol/L (ref 3.5–5.1)
Phosphorus: 2.5 mg/dL (ref 2.5–4.6)
Potassium: 4.4 mmol/L (ref 3.5–5.1)
SODIUM: 135 mmol/L (ref 135–145)
Sodium: 137 mmol/L (ref 135–145)

## 2015-11-21 LAB — PROTIME-INR
INR: 1.54 — AB (ref 0.00–1.49)
Prothrombin Time: 18.5 seconds — ABNORMAL HIGH (ref 11.6–15.2)

## 2015-11-21 LAB — GLUCOSE, CAPILLARY
GLUCOSE-CAPILLARY: 101 mg/dL — AB (ref 65–99)
GLUCOSE-CAPILLARY: 85 mg/dL (ref 65–99)
GLUCOSE-CAPILLARY: 87 mg/dL (ref 65–99)
Glucose-Capillary: 72 mg/dL (ref 65–99)
Glucose-Capillary: 76 mg/dL (ref 65–99)
Glucose-Capillary: 79 mg/dL (ref 65–99)
Glucose-Capillary: 87 mg/dL (ref 65–99)

## 2015-11-21 LAB — APTT: aPTT: 42 seconds — ABNORMAL HIGH (ref 24–37)

## 2015-11-21 LAB — CBC
HCT: 26.6 % — ABNORMAL LOW (ref 39.0–52.0)
Hemoglobin: 8.4 g/dL — ABNORMAL LOW (ref 13.0–17.0)
MCH: 32.2 pg (ref 26.0–34.0)
MCHC: 31.6 g/dL (ref 30.0–36.0)
MCV: 101.9 fL — ABNORMAL HIGH (ref 78.0–100.0)
Platelets: 30 K/uL — ABNORMAL LOW (ref 150–400)
RBC: 2.61 MIL/uL — ABNORMAL LOW (ref 4.22–5.81)
RDW: 18.1 % — ABNORMAL HIGH (ref 11.5–15.5)
WBC: 6.1 K/uL (ref 4.0–10.5)

## 2015-11-21 LAB — MAGNESIUM: Magnesium: 2.3 mg/dL (ref 1.7–2.4)

## 2015-11-21 MED ORDER — LAMIVUDINE 10 MG/ML PO SOLN
50.0000 mg | Freq: Every day | ORAL | Status: DC
  Administered 2015-11-22: 50 mg
  Filled 2015-11-21: qty 5

## 2015-11-21 MED ORDER — VITAL AF 1.2 CAL PO LIQD
1000.0000 mL | ORAL | Status: DC
  Administered 2015-11-21: 1000 mL

## 2015-11-21 NOTE — Progress Notes (Signed)
PULMONARY / CRITICAL CARE MEDICINE   Name: Casey Pennington MRN: [id number] DOB: 1956/03/04    ADMISSION DATE:  11/17/2015  REFERRING MD:  ER  CHIEF COMPLAINT:  Altered mental status  SUBJECTIVE: Patient POD#6 post closure of abdomen. No pressor requirement. Still not following commands with sedation vacation.   REVIEW OF SYSTEMS: Unable to obtain as patient is intubated and altered mentation.   VITAL SIGNS: BP 100/49 mmHg  Pulse 76  Temp(Src) 93.6 F (34.2 C) (Core (Comment))  Resp 19  Ht 5' 9" (1.753 m)  Wt 137 lb 9.1 oz (62.4 kg)  BMI 20.31 kg/m2  SpO2 100%  HEMODYNAMICS: CVP:  [11 mmHg-14 mmHg] 11 mmHg  VENTILATOR SETTINGS: Vent Mode:  [-] PRVC FiO2 (%):  [40 %] 40 % Set Rate:  [20 bmp] 20 bmp Vt Set:  [570 mL] 570 mL PEEP:  [5 cmH20] 5 cmH20 Pressure Support:  [8 cmH20] 8 cmH20 Plateau Pressure:  [15 cmH20-19 cmH20] 18 cmH20  INTAKE / OUTPUT: I/O last 3 completed shifts: In: 4115.8 [I.V.:1271.1; NG/GT:744.7; IV [id number]:7670] Out: 3127 [Urine:7; Other:1770; Stool:1350]  PHYSICAL EXAMINATION: General: No distress. No family present. Shaking head. Integument:  Warm & dry. No rash on exposed skin. Abdominal dressing clean and dry. HEENT:  Endotracheal tube in place. Pupils equal. No scleral injection.  Cardiovascular:  Regular rate. Normal S1 & S2. No JVD. No edema.  Pulmonary:  Coarse breath sounds bilaterally unchanged. Symmetric chest wall expansion on ventilator.  Abdomen: Soft. hypoactive bowel sounds. Abdominal dressing in place clean & dry. Neurological:  Spontaneously moving all 4 extremities. Not following commands. Doesn't attend to voice.  LABS:  BMET  Recent Labs Lab 11/20/15 0455 11/20/15 1534 11/21/15 0557  NA 137 134* 137  K 4.5 4.2 4.3  CL 105 106 105  CO2 _0 BUN _1 CREATININE 1.20 1.13 1.03  GLUCOSE 127* 106* 82    Electrolytes  Recent Labs Lab 11/19/15 0335  11/20/15 0455 11/20/15 1534 11/21/15 0557   CALCIUM 7.3*  < > 7.5* 7.2* 7.5*  MG 2.4  --  2.5*  --  2.3  PHOS 2.9  < > 2.9 3.0 2.5  < > = values in this interval not displayed.  CBC  Recent Labs Lab 11/19/15 0335 11/20/15 0455 11/21/15 0557  WBC 9.0 11.3* 6.1  HGB 9.8* 9.3* 8.4*  HCT 30.9* 29.2* 26.6*  PLT 42* 32* 30*    Coag's  Recent Labs Lab 11/19/15 0335 11/20/15 0455 11/21/15 0557  APTT 37 36 42*  INR 1.38 1.60* 1.54*    Sepsis Markers No results for input(s): LATICACIDVEN, PROCALCITON, O2SATVEN in the last 168 hours.  ABG  Recent Labs Lab 10/25/2015 1205  PHART 7.453*  PCO2ART 32.0*  PO2ART 63.3*    Liver Enzymes  Recent Labs Lab 11/17/15 0310  11/18/15 1430  11/20/15 0455 11/20/15 1534 11/21/15 0557  AST 151*  --  73*  --  48*  --   --   ALT 327*  --  215*  --  128*  --   --   ALKPHOS 96  --  105  --  100  --   --   BILITOT 13.1*  --  12.2*  --  13.5*  --   --   ALBUMIN 2.1*  < > 2.1*  < > 1.8*  1.8* 1.7* 1.7*  < > = values in this interval not displayed.  Cardiac Enzymes No results for input(s): TROPONINI, PROBNP in the  last 168 hours.  Glucose  Recent Labs Lab 11/20/15 0729 11/20/15 1138 11/20/15 1550 11/20/15 2042 11/21/15 0025 11/21/15 0439  GLUCAP 116* 135* 95 97 72 79    Imaging No results found.   STUDIES:  5/17 CT abd/pelvis >> pneumatosis SB 5/18 Echo >> EF 55 to 53%, grade 1 diastolic CHF, PAS 47 mmHg 5/22 Port CXR:  Improving right basilar opacity. No new opacity. Lines unchanged in position. 5/22 Port CXR: Endotracheal tube in good position. No new focal opacity or effusion appreciated. 5/26 Port KUB: bowel gas patter with some dilation in small bowel consistent with ileus.   CULTURES: 5/17 Urine >> E coli 5/17 Blood >> MRSA  5/21 Blood >>  ANTIBIOTICS: 5/17 Vancomycin >> Zosyn 5/17 - 5/26 Diflucan 5/18 - 5/26 5/25 Bactrim >>  SIGNIFICANT EVENTS: 5/17 Admit, surgery consulted 5/19 ID consulted, renal consulted >> start CRRT 5/22 Abdominal  Closure  LINES/TUBES: 5/17 ETT >>  5/18 Lt IJ CVL >> 5/18 Lt femoral a line >> 5/19 Rt IJ HD cath >> 5/19 Lt [place] CVL >>  OGT 5/17 >> Foley 5/17 >>  ASSESSMENT / PLAN:  PULMONARY A: Acute Hypoxic Respiratory Failure  P:   Continue weaning FiO2 for saturation greater than 94%  Goal net negative fluid balance  SBT trials as mental status progresses  CARDIOVASCULAR A:  Intermittent Hypotension - Likely due to sedation. NSTEMI Prolonged QTc - 559m 5/18. 5213m5/26. Shock - Resolved. Septic. H/O Pulmonary HTN  P:  Vitals per unit protocol Continue monitoring on telemetry   RENAL A:   Acute Renal Failure  P:   Nephrology following Transitioning off CVVHD today Trending electrolytes per Nephrology  GASTROINTESTINAL A:   Ileus Ischemic Small Bowel - S/P Laparotomy. Transaminitis - Improving. Protein calorie malnutrition. Hep C with cirrhosis Rectal bleeding - Likely mucosal irritation from rectal tube in setting of thrombocytopenia. Resolved.  P:   NPO Protonix IV daily Post op care per CCS Trickle tube feedings Trending LFTs daily  HEMATOLOGIC A:   GI Bleeding - Likely post op & some oozing from rectal tube irritation in setting of thrombocytopenia. Coagulopathy - Mild & fibrinogen >100. Anemia - Hgb slightly worse today. Heme+ stool. Thrombocytopenia - Likely combination of sepsis & hepatic cirrhosis. S/P 1u Platelets 5/24. Stable.  P:  Trending Cell Counts daily w/ CBC Trending Coags daily SCDs  INFECTIOUS A:   Septic Shock MRSA bacteremia - Blood Ctx 5/21 negative. E coli UTI Peritonitis H/O HIV & Hep C - CD4 120 on 10/29/2015.  P:   Infectious Diseases Following Continuing Vancomycin Day #11 total Consider TEE depending on family wishes  ENDOCRINE A:   Hyperglycemia - Possibly secondary to infection & steroids. Controlled.  P:   Accu-Checks q4hr Low dose SSI per algorithm  NEUROLOGIC A:   Acute Encephalopathy - Metabolic vs  Hepatic Encephalopathy vs Delirium. Not improving.  P:   RASS Goal: 0 to -1 Fentanyl gtt & IV prn Versed IV prn Thiamine 50036m8hr IV x3 days FA IV daily Holding on Lactulose (Ammonia 32) Unable to use Haldol/Seroquel/Zyprexa for QTc prolonged  FAMILY UPDATE:  Mother & Father updated by Dr. NesAshok Cordia26 at bedside & 5/28 via phone.  TODAY'S SUMMARY:  [age] male with h/o IVDA, HIV, Hep C with cirrhosis, Pulmonary HTN. Now with ischemic SB with peritonitis, septic shock NSTEMI, AKI, VDRF, MRSA bacteremia after binge of illicit substances. Patient had hypotension after Precedex infusion. Transitioning off CVVHD today. Plan for family meeting tomorrow to  discuss goals of care.   I have spent a total of 31 minutes of critical care time today caring for the patient, updating his mother, and reviewing the patient's electronic medical record.  Sonia Baller Ashok Cordia, M.D. [facility] Pager:  [phone] After 3pm or if no response, call [phone]  11/21/2015, 6:43 AM

## 2015-11-21 NOTE — Progress Notes (Signed)
INFECTIOUS DISEASE PROGRESS NOTE  ID: Casey Pennington is a 60 y.o. male with  Principal Problem:   Rhabdomyolysis Active Problems:   HIV disease ([place])   Pulmonary HTN ([place])   Chronic pain   BPH (benign prostatic hyperplasia)   Cirrhosis of liver without ascites ([facility])   Chronic hepatitis C ([place])   AKI (acute kidney injury) ([place])   Drug intoxication ([place])   Polysubstance abuse   Thrombocytopenia ([place])   Relative polycythemia   Pneumatosis intestinalis   Septic shock ([facility])   Acute respiratory failure ([facility])  Subjective: Hypothermic On fentanyl, midazolam.  On vent  Abtx:  Anti-infectives    Start     Dose/Rate Route Frequency Ordered Stop   11/19/15 1200  lamiVUDine (EPIVIR) 10 MG/ML solution 150 mg     150 mg Per Tube Daily 11/19/15 1048     11/19/15 1200  abacavir (ZIAGEN) tablet 600 mg     600 mg Per Tube Daily 11/19/15 1048     11/19/15 1200  dolutegravir (TIVICAY) tablet 50 mg     50 mg Oral Daily 11/19/15 1048     11/18/15 1430  sulfamethoxazole-trimethoprim (BACTRIM,SEPTRA) 200-40 MG/5ML suspension 20 mL     20 mL Per Tube Daily 11/18/15 1415     11/18/15 1000  vancomycin (VANCOCIN) IVPB 1000 mg/200 mL premix     1,000 mg 200 mL/hr over 60 Minutes Intravenous Every 24 hours 11/17/15 1239     11/18/15 0500  piperacillin-tazobactam (ZOSYN) IVPB 2.25 g  Status:  Discontinued     2.25 g 100 mL/hr over 30 Minutes Intravenous Every 6 hours 11/18/15 0453 11/19/15 1035   11/08/2015 0600  fluconazole (DIFLUCAN) IVPB 400 mg  Status:  Discontinued     400 mg 100 mL/hr over 120 Minutes Intravenous Daily 11/14/15 0913 11/19/15 1035   11/07/2015 1000  vancomycin (VANCOCIN) IVPB 750 mg/150 ml premix  Status:  Discontinued     750 mg 150 mL/hr over 60 Minutes Intravenous Every 24 hours 11/12/15 1343 11/17/15 1239   11/12/15 2100  vancomycin (VANCOCIN) IVPB 1000 mg/200 mL premix  Status:  Discontinued     1,000 mg 200 mL/hr over 60 Minutes Intravenous Every 48 hours 10/29/2015 0622  11/12/15 0940   11/12/15 1400  piperacillin-tazobactam (ZOSYN) IVPB 2.25 g  Status:  Discontinued     2.25 g 100 mL/hr over 30 Minutes Intravenous Every 6 hours 11/12/15 1343 11/18/15 0453   11/12/15 1030  vancomycin (VANCOCIN) 1,250 mg in sodium chloride 0.9 % 250 mL IVPB     1,250 mg 166.7 mL/hr over 90 Minutes Intravenous  Once 11/12/15 0940 11/12/15 1256   11/04/2015 2000  vancomycin (VANCOCIN) IVPB 750 mg/150 ml premix  Status:  Discontinued     750 mg 150 mL/hr over 60 Minutes Intravenous Every 24 hours 10/27/2015 2128 10/27/2015 0622   11/08/2015 1400  piperacillin-tazobactam (ZOSYN) IVPB 2.25 g  Status:  Discontinued     2.25 g 100 mL/hr over 30 Minutes Intravenous Every 8 hours 11/12/2015 0622 11/12/15 1343   11/21/2015 1000  abacavir-dolutegravir-lamiVUDine (TRIUMEQ) 600-50-300 MG per tablet 1 tablet  Status:  Discontinued     1 tablet Oral Daily 11/23/2015 2112 10/25/2015 0517   10/25/2015 0700  fluconazole (DIFLUCAN) IVPB 200 mg  Status:  Discontinued     200 mg 100 mL/hr over 60 Minutes Intravenous Daily 11/18/2015 0621 11/14/15 0913   10/26/2015 0600  piperacillin-tazobactam (ZOSYN) IVPB 3.375 g  Status:  Discontinued     3.375 g  12.5 mL/hr over 240 Minutes Intravenous Every 8 hours 11/17/2015 2128 11/16/2015 0622   11/18/2015 2130  vancomycin (VANCOCIN) IVPB 1000 mg/200 mL premix     1,000 mg 200 mL/hr over 60 Minutes Intravenous  Once 11/05/2015 2112 11/24/2015 2302   11/20/2015 2115  piperacillin-tazobactam (ZOSYN) IVPB 3.375 g     3.375 g 100 mL/hr over 30 Minutes Intravenous  Once 11/08/2015 2112 11/09/2015 2216   10/27/2015 2000  piperacillin-tazobactam (ZOSYN) IVPB 3.375 g  Status:  Discontinued    Comments:  Give as soon as cultures drawn   3.375 g 100 mL/hr over 30 Minutes Intravenous  Once 11/01/2015 1953 11/06/2015 2123   10/31/2015 2000  vancomycin (VANCOCIN) IVPB 1000 mg/200 mL premix  Status:  Discontinued    Comments:  Give as soon as cultures drawn   1,000 mg 200 mL/hr over 60 Minutes Intravenous   Once 10/31/2015 1953 11/17/2015 2123      Medications:  Scheduled: . abacavir  600 mg Per Tube Daily  . antiseptic oral rinse  7 mL Mouth Rinse QID  . chlorhexidine gluconate (SAGE KIT)  15 mL Mouth Rinse BID  . dolutegravir  50 mg Oral Daily  . feeding supplement (VITAL AF 1.2 CAL)  1,000 mL Per Tube Q24H  . insulin aspart  0-9 Units Subcutaneous Q4H  . lamiVUDine  150 mg Per Tube Daily  . pantoprazole sodium  40 mg Per Tube Daily  . sodium chloride flush  10-40 mL Intravenous Q12H  . sodium chloride flush  10-40 mL Intracatheter Q12H  . sulfamethoxazole-trimethoprim  20 mL Per Tube Daily  . thiamine IV  500 mg Intravenous Q8H  . vancomycin  1,000 mg Intravenous Q24H    Objective: Vital signs in last 24 hours: Temp:  [91.6 F (33.1 C)-94.6 F (34.8 C)] 94.5 F (34.7 C) (05/28 1130) Pulse Rate:  [63-88] 78 (05/28 1130) Resp:  [14-30] 15 (05/28 1130) BP: (51-113)/(38-64) 113/53 mmHg (05/28 0717) SpO2:  [92 %-100 %] 99 % (05/28 1130) Arterial Line BP: (67-127)/(38-62) 99/47 mmHg (05/28 1130) FiO2 (%):  [40 %] 40 % (05/28 0800) Weight:  [62.4 kg (137 lb 9.1 oz)] 62.4 kg (137 lb 9.1 oz) (05/28 0500)   General appearance: does nto respond to voice Neck: R HD line Resp: clear to auscultation bilaterally Cardio: regular rate and rhythm GI: normal findings: bowel sounds normal and soft, non-tender  Lab Results  Recent Labs  11/20/15 0455 11/20/15 1534 11/21/15 0557  WBC 11.3*  --  6.1  HGB 9.3*  --  8.4*  HCT 29.2*  --  26.6*  NA 137 134* 137  K 4.5 4.2 4.3  CL 105 106 105  CO2 _0 BUN _1 CREATININE 1.20 1.13 1.03   Liver Panel  Recent Labs  11/18/15 1430  11/20/15 0455 11/20/15 1534 11/21/15 0557  PROT 6.2*  --  5.9*  --   --   ALBUMIN 2.1*  < > 1.8*  1.8* 1.7* 1.7*  AST 73*  --  48*  --   --   ALT 215*  --  128*  --   --   ALKPHOS 105  --  100  --   --   BILITOT 12.2*  --  13.5*  --   --   BILIDIR 7.5*  --  8.3*  --   --   IBILI 4.7*  --   5.2*  --   --   < > = values in this  interval not displayed. Sedimentation Rate No results for input(s): ESRSEDRATE in the last 72 hours. C-Reactive Protein No results for input(s): CRP in the last 72 hours.  Microbiology: Recent Results (from the past 240 hour(s))  Culture, blood (routine x 2)     Status: None   Collection Time: 11/14/15  5:30 AM  Result Value Ref Range Status   Specimen Description BLOOD LEFT ANTECUBITAL  Final   Special Requests BOTTLES DRAWN AEROBIC ONLY 3CC  Final   Culture NO GROWTH 5 DAYS  Final   Report Status 11/19/2015 FINAL  Final  Culture, blood (routine x 2)     Status: None   Collection Time: 11/14/15  6:35 AM  Result Value Ref Range Status   Specimen Description BLOOD RIGHT ARM  Final   Special Requests BOTTLES DRAWN AEROBIC ONLY 5CC  Final   Culture NO GROWTH 5 DAYS  Final   Report Status 11/19/2015 FINAL  Final  Surgical pcr screen     Status: Abnormal   Collection Time: 11/14/15  8:06 PM  Result Value Ref Range Status   MRSA, PCR POSITIVE (A) NEGATIVE Final    Comment: RESULT CALLED TO, READ BACK BY AND VERIFIED WITH: MSteffanie Dunn AT 0747 ON [id number] BY S. YARBROUGH    Staphylococcus aureus POSITIVE (A) NEGATIVE Final    Comment:        The Xpert SA Assay (FDA approved for NASAL specimens in patients over 67 years of age), is one component of a comprehensive surveillance program.  Test performance has been validated by [facility] for patients greater than or equal to 57 year old. It is not intended to diagnose infection nor to guide or monitor treatment.     Studies/Results: Dg Abd Portable 1v  11/19/2015  CLINICAL DATA:  Emesis EXAM: PORTABLE ABDOMEN - 1 VIEW COMPARISON:  11/17/2015 FINDINGS: Scattered large and small bowel gas is noted. Mild small bowel dilatation is seen. This is increased somewhat in the interval from the prior exam. This may represent a postoperative ileus. Correlation with the physical exam is recommended. A  nasogastric catheter is seen within the stomach. No acute bony abnormality is noted. IMPRESSION: Small bowel dilatation likely representing postoperative ileus. Correlation with the physical exam is recommended. Electronically Signed   By: Inez Catalina M.D.   On: 11/19/2015 14:04     Assessment/Plan: Ischemic bowel, resection MRSA bacteremia (5-17)  Repeat BCx 5-21 (-)  Would consider TEE to determine length of therapy.   TTE 5-17 no mention of vegetation.   With his recent hx of drug abuse, he is at risk for endocarditis.   HIV/AIDS   On ABC/DTGV/3TC  ARF  On CRRT  Hep C  Treated, cured (for now)  Polysubstance abuse  Available as needed Dr Baxter Flattery returns on 5-30  Total days of antibiotics: [facility] (pager) [phone] [email or web address] 11/21/2015, 12:15 PM  LOS: 11 days

## 2015-11-21 NOTE — Progress Notes (Signed)
Patient ID: Casey Pennington, male   DOB: 1956/03/30, 60 y.o.   MRN: [id number]  [facility] Progress Note   Assessment/ Plan:   1. AKI: Anuric and multifactorial secondary to rhabdomyolysis and septic shock. Continue CRRT at the current prescription given his anuric state/absence of renal recovery. Hypotension better-start ultrafiltration at 50 mL per hour-discontinue CRRT this evening and reassess ability to transition to IHD next week. 2. Ischemic bowel: Status post exploratory surgery/bowel resection 7 days ago with ileocolic anastomosis and fascial closure 5 days ago-on trickle tube feeds with further escalation for surgery. On ABx coverage with Vancomycin and Bactrim. 3. Hypocalcemia: Calcium 7.5, albumin 2.0 (corrected calcium within acceptable range).  4. MRSA sepsis: On antimicrobial coverage with vancomycin-blood cultures from 5/21 negative to date. 5. Cirrhosis/hepatitis C/thrombocytopenia: With historical report of hepatitis C treated while incarcerated--negative HCV-RNA. S/P platelet transfusion for suspected hematochezia and lower Hgb/plts--- thrombocytopenia. 6. HIV infection: On antiretroviral therapy per infectious diseases team  Subjective:   No acute events noted overnight.    Objective:   BP 100/49 mmHg  Pulse 85  Temp(Src) 94.1 F (34.5 C) (Core (Comment))  Resp 19  Ht _0  (1.753 m)  Wt 62.4 kg (137 lb 9.1 oz)  BMI 20.31 kg/m2  SpO2 99%  Intake/Output Summary (Last 24 hours) at 11/21/15 5188 Last data filed at 11/21/15 0700  Gross per 24 hour  Intake 1393.26 ml  Output    983 ml  Net 410.26 ml   Weight change: 0.7 kg (1 lb 8.7 oz)  Physical Exam: [id number], opens eyes, not responsive to verbal commands CVS: Pulse regular rhythm, normal rate, S1-S2 with ESM Resp: Breath sounds are clear bilaterally-no rales/rhonchi Abd: Soft, flat, Abdominal wound dressing intact/dry Ext: No lower extremity edema  Imaging: Dg Abd Portable  1v  11/19/2015  CLINICAL DATA:  Emesis EXAM: PORTABLE ABDOMEN - 1 VIEW COMPARISON:  11/06/2015 FINDINGS: Scattered large and small bowel gas is noted. Mild small bowel dilatation is seen. This is increased somewhat in the interval from the prior exam. This may represent a postoperative ileus. Correlation with the physical exam is recommended. A nasogastric catheter is seen within the stomach. No acute bony abnormality is noted. IMPRESSION: Small bowel dilatation likely representing postoperative ileus. Correlation with the physical exam is recommended. Electronically Signed   By: Inez Catalina M.D.   On: 11/19/2015 14:04    Labs: BMET  Recent Labs Lab 11/18/15 0415 11/18/15 1655 11/19/15 0335 11/19/15 1545 11/20/15 0455 11/20/15 1534 11/21/15 0557  NA 135 135 137 136 137 134* 137  K 4.0 4.2 3.8 4.1 4.5 4.2 4.3  CL 106 106 104 106 105 106 105  CO2 _1 GLUCOSE 118* 115* 108* 106* 127* 106* 82  BUN _2 CREATININE 1.07 1.22 1.29* 1.30* 1.20 1.13 1.03  CALCIUM 7.4* 7.3* 7.3* 7.2* 7.5* 7.2* 7.5*  PHOS 2.9 2.8 2.9 2.8 2.9 3.0 2.5   CBC  Recent Labs Lab 11/18/15 0415 11/19/15 0335 11/20/15 0455 11/21/15 0557  WBC 7.1 9.0 11.3* 6.1  HGB 9.5* 9.8* 9.3* 8.4*  HCT 29.9* 30.9* 29.2* 26.6*  MCV 101.0* 100.0 100.3* 101.9*  PLT 35* 42* 32* 30*    Medications:    . abacavir  600 mg Per Tube Daily  . antiseptic oral rinse  7 mL Mouth Rinse QID  . chlorhexidine gluconate (SAGE KIT)  15 mL Mouth Rinse BID  . dolutegravir  50  mg Oral Daily  . insulin aspart  0-9 Units Subcutaneous Q4H  . lamiVUDine  150 mg Per Tube Daily  . pantoprazole sodium  40 mg Per Tube Daily  . sodium chloride flush  10-40 mL Intravenous Q12H  . sodium chloride flush  10-40 mL Intracatheter Q12H  . sulfamethoxazole-trimethoprim  20 mL Per Tube Daily  . thiamine IV  500 mg Intravenous Q8H  . vancomycin  1,000 mg Intravenous Q24H   Elmarie Shiley, MD 11/21/2015, 7:52 AM

## 2015-11-21 NOTE — Progress Notes (Signed)
Patient ID: ESTLE HUGULEY, male   DOB: 08/25/1955, 60 y.o.   MRN: [id number]     [address] [place], [place] [address]    Phone: [phone] FAX: [phone]     Subjective: Wbc normal. Afebrile.  Rectal tube in place, having BMs.  ngt clamped. TF off.    Objective:  Vital signs:  Filed Vitals:   11/21/15 0700 11/21/15 0717 11/21/15 0730 11/21/15 0800  BP:  113/53    Pulse: 85  84 88  Temp: 94.1 F (34.5 C)  94.3 F (34.6 C) 94.3 F (34.6 C)  TempSrc:    Core (Comment)  Resp: _0 Height:      Weight:      SpO2: 99% 100% 98% 98%    Last BM Date: 11/21/15  Intake/Output   Yesterday:  05/27 0701 - 05/28 0700 In: 1393.3 [I.V.:663.3; NG/GT:330; IV Piggyback:400] Out: 470 [Stool:150] This shift:  Total I/O In: 28 [I.V.:28] Out: 27 [Other:27]   Physical Exam: General: Pt awake, intubated, does not follow commands.    Abdomen: Soft.  Nondistended. Tender.  Midline wound with fascia intact, beefy red, no purulent drainage.  No evidence of peritonitis.  No incarcerated hernias.    Problem List:   Principal Problem:   Rhabdomyolysis Active Problems:   HIV disease ([place])   Pulmonary HTN ([place])   Chronic pain   BPH (benign prostatic hyperplasia)   Cirrhosis of liver without ascites (H[facility])   Chronic hepatitis C ([place])   AKI (acute kidney injury) ([place])   Drug intoxication ([place])   Polysubstance abuse   Thrombocytopenia ([place])   Relative polycythemia   Pneumatosis intestinalis   Septic shock (H[facility])   Acute respiratory failure ([place])    Results:   Labs: Results for orders placed or performed during the hospital encounter of 10/25/2015 (from the past 48 hour(s))  Glucose, capillary     Status: Abnormal   Collection Time: 11/19/15 11:59 AM  Result Value Ref Range   Glucose-Capillary 126 (H) 65 - 99 mg/dL  Renal function panel (daily at 1600)     Status: Abnormal   Collection Time:  11/19/15  3:45 PM  Result Value Ref Range   Sodium 136 135 - 145 mmol/L   Potassium 4.1 3.5 - 5.1 mmol/L   Chloride 106 101 - 111 mmol/L   CO2 22 22 - 32 mmol/L   Glucose, Bld 106 (H) 65 - 99 mg/dL   BUN 19 6 - 20 mg/dL   Creatinine, Ser 1.30 (H) 0.61 - 1.24 mg/dL   Calcium 7.2 (L) 8.9 - 10.3 mg/dL   Phosphorus 2.8 2.5 - 4.6 mg/dL   Albumin 1.9 (L) 3.5 - 5.0 g/dL   GFR calc non Af Amer 59 (L) >60 mL/min   GFR calc Af Amer >60 >60 mL/min    Comment: (NOTE) The eGFR has been calculated using the CKD EPI equation. This calculation has not been validated in all clinical situations. eGFR's persistently <60 mL/min signify possible Chronic Kidney Disease.    Anion gap 8 5 - 15  Glucose, capillary     Status: Abnormal   Collection Time: 11/19/15  3:49 PM  Result Value Ref Range   Glucose-Capillary 101 (H) 65 - 99 mg/dL   Comment 1 Document in Chart   Glucose, capillary     Status: None   Collection Time: 11/19/15  7:23 PM  Result Value Ref  Range   Glucose-Capillary 90 65 - 99 mg/dL  Glucose, capillary     Status: Abnormal   Collection Time: 11/19/15 11:54 PM  Result Value Ref Range   Glucose-Capillary 116 (H) 65 - 99 mg/dL  Glucose, capillary     Status: Abnormal   Collection Time: 11/20/15  3:48 AM  Result Value Ref Range   Glucose-Capillary 108 (H) 65 - 99 mg/dL   Comment 1 Notify RN   Ammonia     Status: None   Collection Time: 11/20/15  4:54 AM  Result Value Ref Range   Ammonia 32 9 - 35 umol/L  Renal function panel (daily at 0500)     Status: Abnormal   Collection Time: 11/20/15  4:55 AM  Result Value Ref Range   Sodium 137 135 - 145 mmol/L   Potassium 4.5 3.5 - 5.1 mmol/L   Chloride 105 101 - 111 mmol/L   CO2 25 22 - 32 mmol/L   Glucose, Bld 127 (H) 65 - 99 mg/dL   BUN 15 6 - 20 mg/dL   Creatinine, Ser 1.20 0.61 - 1.24 mg/dL   Calcium 7.5 (L) 8.9 - 10.3 mg/dL   Phosphorus 2.9 2.5 - 4.6 mg/dL   Albumin 1.8 (L) 3.5 - 5.0 g/dL   GFR calc non Af Amer >60 >60 mL/min    GFR calc Af Amer >60 >60 mL/min    Comment: (NOTE) The eGFR has been calculated using the CKD EPI equation. This calculation has not been validated in all clinical situations. eGFR's persistently <60 mL/min signify possible Chronic Kidney Disease.    Anion gap 7 5 - 15  Magnesium     Status: Abnormal   Collection Time: 11/20/15  4:55 AM  Result Value Ref Range   Magnesium 2.5 (H) 1.7 - 2.4 mg/dL  CBC     Status: Abnormal   Collection Time: 11/20/15  4:55 AM  Result Value Ref Range   WBC 11.3 (H) 4.0 - 10.5 K/uL   RBC 2.91 (L) 4.22 - 5.81 MIL/uL   Hemoglobin 9.3 (L) 13.0 - 17.0 g/dL   HCT 29.2 (L) 39.0 - 52.0 %   MCV 100.3 (H) 78.0 - 100.0 fL   MCH 32.0 26.0 - 34.0 pg   MCHC 31.8 30.0 - 36.0 g/dL   RDW 17.9 (H) 11.5 - 15.5 %   Platelets 32 (L) 150 - 400 K/uL    Comment: CONSISTENT WITH PREVIOUS RESULT  Protime-INR     Status: Abnormal   Collection Time: 11/20/15  4:55 AM  Result Value Ref Range   Prothrombin Time 19.1 (H) 11.6 - 15.2 seconds   INR 1.60 (H) 0.00 - 1.49  APTT     Status: None   Collection Time: 11/20/15  4:55 AM  Result Value Ref Range   aPTT 36 24 - 37 seconds  Hepatic function panel     Status: Abnormal   Collection Time: 11/20/15  4:55 AM  Result Value Ref Range   Total Protein 5.9 (L) 6.5 - 8.1 g/dL   Albumin 1.8 (L) 3.5 - 5.0 g/dL   AST 48 (H) 15 - 41 U/L   ALT 128 (H) 17 - 63 U/L   Alkaline Phosphatase 100 38 - 126 U/L   Total Bilirubin 13.5 (H) 0.3 - 1.2 mg/dL   Bilirubin, Direct 8.3 (H) 0.1 - 0.5 mg/dL   Indirect Bilirubin 5.2 (H) 0.3 - 0.9 mg/dL  Glucose, capillary     Status: Abnormal   Collection Time: 11/20/15  7:29 AM  Result Value Ref Range   Glucose-Capillary 116 (H) 65 - 99 mg/dL  Glucose, capillary     Status: Abnormal   Collection Time: 11/20/15 11:38 AM  Result Value Ref Range   Glucose-Capillary 135 (H) 65 - 99 mg/dL  Renal function panel (daily at 1600)     Status: Abnormal   Collection Time: 11/20/15  3:34 PM  Result Value  Ref Range   Sodium 134 (L) 135 - 145 mmol/L   Potassium 4.2 3.5 - 5.1 mmol/L   Chloride 106 101 - 111 mmol/L   CO2 23 22 - 32 mmol/L   Glucose, Bld 106 (H) 65 - 99 mg/dL   BUN 17 6 - 20 mg/dL   Creatinine, Ser 1.13 0.61 - 1.24 mg/dL   Calcium 7.2 (L) 8.9 - 10.3 mg/dL   Phosphorus 3.0 2.5 - 4.6 mg/dL   Albumin 1.7 (L) 3.5 - 5.0 g/dL   GFR calc non Af Amer >60 >60 mL/min   GFR calc Af Amer >60 >60 mL/min    Comment: (NOTE) The eGFR has been calculated using the CKD EPI equation. This calculation has not been validated in all clinical situations. eGFR's persistently <60 mL/min signify possible Chronic Kidney Disease.    Anion gap 5 5 - 15  Glucose, capillary     Status: None   Collection Time: 11/20/15  3:50 PM  Result Value Ref Range   Glucose-Capillary 95 65 - 99 mg/dL  Glucose, capillary     Status: None   Collection Time: 11/20/15  8:42 PM  Result Value Ref Range   Glucose-Capillary 97 65 - 99 mg/dL  Glucose, capillary     Status: None   Collection Time: 11/21/15 12:25 AM  Result Value Ref Range   Glucose-Capillary 72 65 - 99 mg/dL  Glucose, capillary     Status: None   Collection Time: 11/21/15  4:39 AM  Result Value Ref Range   Glucose-Capillary 79 65 - 99 mg/dL  Magnesium     Status: None   Collection Time: 11/21/15  5:57 AM  Result Value Ref Range   Magnesium 2.3 1.7 - 2.4 mg/dL  CBC     Status: Abnormal   Collection Time: 11/21/15  5:57 AM  Result Value Ref Range   WBC 6.1 4.0 - 10.5 K/uL   RBC 2.61 (L) 4.22 - 5.81 MIL/uL   Hemoglobin 8.4 (L) 13.0 - 17.0 g/dL   HCT 26.6 (L) 39.0 - 52.0 %   MCV 101.9 (H) 78.0 - 100.0 fL   MCH 32.2 26.0 - 34.0 pg   MCHC 31.6 30.0 - 36.0 g/dL   RDW 18.1 (H) 11.5 - 15.5 %   Platelets 30 (L) 150 - 400 K/uL    Comment: CONSISTENT WITH PREVIOUS RESULT  Protime-INR     Status: Abnormal   Collection Time: 11/21/15  5:57 AM  Result Value Ref Range   Prothrombin Time 18.5 (H) 11.6 - 15.2 seconds   INR 1.54 (H) 0.00 - 1.49  APTT      Status: Abnormal   Collection Time: 11/21/15  5:57 AM  Result Value Ref Range   aPTT 42 (H) 24 - 37 seconds    Comment:        IF BASELINE aPTT IS ELEVATED, SUGGEST PATIENT RISK ASSESSMENT BE USED TO DETERMINE APPROPRIATE ANTICOAGULANT THERAPY.   Renal function panel     Status: Abnormal   Collection Time: 11/21/15  5:57 AM  Result Value Ref Range   Sodium  137 135 - 145 mmol/L   Potassium 4.3 3.5 - 5.1 mmol/L   Chloride 105 101 - 111 mmol/L   CO2 26 22 - 32 mmol/L   Glucose, Bld 82 65 - 99 mg/dL   BUN 16 6 - 20 mg/dL   Creatinine, Ser 1.03 0.61 - 1.24 mg/dL   Calcium 7.5 (L) 8.9 - 10.3 mg/dL   Phosphorus 2.5 2.5 - 4.6 mg/dL   Albumin 1.7 (L) 3.5 - 5.0 g/dL   GFR calc non Af Amer >60 >60 mL/min   GFR calc Af Amer >60 >60 mL/min    Comment: (NOTE) The eGFR has been calculated using the CKD EPI equation. This calculation has not been validated in all clinical situations. eGFR's persistently <60 mL/min signify possible Chronic Kidney Disease.    Anion gap 6 5 - 15    Imaging / Studies: Dg Abd Portable 1v  11/19/2015  CLINICAL DATA:  Emesis EXAM: PORTABLE ABDOMEN - 1 VIEW COMPARISON:  11/18/2015 FINDINGS: Scattered large and small bowel gas is noted. Mild small bowel dilatation is seen. This is increased somewhat in the interval from the prior exam. This may represent a postoperative ileus. Correlation with the physical exam is recommended. A nasogastric catheter is seen within the stomach. No acute bony abnormality is noted. IMPRESSION: Small bowel dilatation likely representing postoperative ileus. Correlation with the physical exam is recommended. Electronically Signed   By: Inez Catalina M.D.   On: 11/19/2015 14:04    Medications / Allergies:  Scheduled Meds: . abacavir  600 mg Per Tube Daily  . antiseptic oral rinse  7 mL Mouth Rinse QID  . chlorhexidine gluconate (SAGE KIT)  15 mL Mouth Rinse BID  . dolutegravir  50 mg Oral Daily  . insulin aspart  0-9 Units Subcutaneous  Q4H  . lamiVUDine  150 mg Per Tube Daily  . pantoprazole sodium  40 mg Per Tube Daily  . sodium chloride flush  10-40 mL Intravenous Q12H  . sodium chloride flush  10-40 mL Intracatheter Q12H  . sulfamethoxazole-trimethoprim  20 mL Per Tube Daily  . thiamine IV  500 mg Intravenous Q8H  . vancomycin  1,000 mg Intravenous Q24H   Continuous Infusions: . feeding supplement (VITAL AF 1.2 CAL) Stopped (11/19/15 1252)  . fentaNYL infusion INTRAVENOUS 150 mcg/hr (11/21/15 0700)  . midazolam (VERSED) infusion 3 mg/hr (11/21/15 0700)  . norepinephrine (LEVOPHED) Adult infusion Stopped (11/18/2015 1800)  . dialysis replacement fluid (prismasate) 1,000 mL/hr at 11/21/15 0759  . dialysis replacement fluid (prismasate) 400 mL/hr at 11/20/15 0752  . dialysate (PRISMASATE) 1,500 mL/hr at 11/21/15 0702   PRN Meds:.sodium chloride, fentaNYL, heparin, heparin, midazolam, [DISCONTINUED] ondansetron **OR** ondansetron (ZOFRAN) IV, sodium chloride, sodium chloride flush  Antibiotics: Anti-infectives    Start     Dose/Rate Route Frequency Ordered Stop   11/19/15 1200  lamiVUDine (EPIVIR) 10 MG/ML solution 150 mg     150 mg Per Tube Daily 11/19/15 1048     11/19/15 1200  abacavir (ZIAGEN) tablet 600 mg     600 mg Per Tube Daily 11/19/15 1048     11/19/15 1200  dolutegravir (TIVICAY) tablet 50 mg     50 mg Oral Daily 11/19/15 1048     11/18/15 1430  sulfamethoxazole-trimethoprim (BACTRIM,SEPTRA) 200-40 MG/5ML suspension 20 mL     20 mL Per Tube Daily 11/18/15 1415     11/18/15 1000  vancomycin (VANCOCIN) IVPB 1000 mg/200 mL premix     1,000 mg 200 mL/hr over 60 Minutes  Intravenous Every 24 hours 11/17/15 1239     11/18/15 0500  piperacillin-tazobactam (ZOSYN) IVPB 2.25 g  Status:  Discontinued     2.25 g 100 mL/hr over 30 Minutes Intravenous Every 6 hours 11/18/15 0453 11/19/15 1035   11/02/2015 0600  fluconazole (DIFLUCAN) IVPB 400 mg  Status:  Discontinued     400 mg 100 mL/hr over 120 Minutes  Intravenous Daily 11/14/15 0913 11/19/15 1035   11/24/2015 1000  vancomycin (VANCOCIN) IVPB 750 mg/150 ml premix  Status:  Discontinued     750 mg 150 mL/hr over 60 Minutes Intravenous Every 24 hours 11/12/15 1343 11/17/15 1239   11/12/15 2100  vancomycin (VANCOCIN) IVPB 1000 mg/200 mL premix  Status:  Discontinued     1,000 mg 200 mL/hr over 60 Minutes Intravenous Every 48 hours 11/12/2015 0622 11/12/15 0940   11/12/15 1400  piperacillin-tazobactam (ZOSYN) IVPB 2.25 g  Status:  Discontinued     2.25 g 100 mL/hr over 30 Minutes Intravenous Every 6 hours 11/12/15 1343 11/18/15 0453   11/12/15 1030  vancomycin (VANCOCIN) 1,250 mg in sodium chloride 0.9 % 250 mL IVPB     1,250 mg 166.7 mL/hr over 90 Minutes Intravenous  Once 11/12/15 0940 11/12/15 1256   11/01/2015 2000  vancomycin (VANCOCIN) IVPB 750 mg/150 ml premix  Status:  Discontinued     750 mg 150 mL/hr over 60 Minutes Intravenous Every 24 hours 11/18/2015 2128 10/30/2015 0622   11/09/2015 1400  piperacillin-tazobactam (ZOSYN) IVPB 2.25 g  Status:  Discontinued     2.25 g 100 mL/hr over 30 Minutes Intravenous Every 8 hours 11/08/2015 0622 11/12/15 1343   10/30/2015 1000  abacavir-dolutegravir-lamiVUDine (TRIUMEQ) 600-50-300 MG per tablet 1 tablet  Status:  Discontinued     1 tablet Oral Daily 11/21/2015 2112 11/05/2015 0517   11/06/2015 0700  fluconazole (DIFLUCAN) IVPB 200 mg  Status:  Discontinued     200 mg 100 mL/hr over 60 Minutes Intravenous Daily 11/21/2015 0621 11/14/15 0913   10/29/2015 0600  piperacillin-tazobactam (ZOSYN) IVPB 3.375 g  Status:  Discontinued     3.375 g 12.5 mL/hr over 240 Minutes Intravenous Every 8 hours 11/07/2015 2128 10/26/2015 0622   11/17/2015 2130  vancomycin (VANCOCIN) IVPB 1000 mg/200 mL premix     1,000 mg 200 mL/hr over 60 Minutes Intravenous  Once 11/23/2015 2112 10/26/2015 2302   11/09/2015 2115  piperacillin-tazobactam (ZOSYN) IVPB 3.375 g     3.375 g 100 mL/hr over 30 Minutes Intravenous  Once 11/21/2015 2112 10/27/2015 2216    11/09/2015 2000  piperacillin-tazobactam (ZOSYN) IVPB 3.375 g  Status:  Discontinued    Comments:  Give as soon as cultures drawn   3.375 g 100 mL/hr over 30 Minutes Intravenous  Once 10/26/2015 1953 11/07/2015 2123   11/09/2015 2000  vancomycin (VANCOCIN) IVPB 1000 mg/200 mL premix  Status:  Discontinued    Comments:  Give as soon as cultures drawn   1,000 mg 200 mL/hr over 60 Minutes Intravenous  Once 11/09/2015 1953 11/05/2015 2123        Assessment/Plan: 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 Dennis Bast 10/27/2015 laparotomy, small bowel resection, open abdomen vac---Dr. Kieth Brightly 11/07/2015 exploratory laparotomy, ileocoic anastomosis, closure of abdomen---Dr. Ninfa Linden -NGT clamped, no n/v, having diarrhea.  Try trickle TF -continue BID Wet to dry dressing changes  ID-MRSA bacteremia, HIV, hep C, e coli UTI. Infectious disease following resp-on vent Cirrhosis-hep C, EtOH Acute renal failure-CRRT, renal following CV-NSTEMI, stock   ,  ANP-BC [facility]   11/21/2015 8:30 AM

## 2015-11-22 ENCOUNTER — Inpatient Hospital Stay (HOSPITAL_COMMUNITY): Payer: Medicaid Other

## 2015-11-22 LAB — VANCOMYCIN, TROUGH: Vancomycin Tr: 34 ug/mL (ref 10.0–20.0)

## 2015-11-22 LAB — RENAL FUNCTION PANEL
ALBUMIN: 1.7 g/dL — AB (ref 3.5–5.0)
ANION GAP: 7 (ref 5–15)
BUN: 26 mg/dL — AB (ref 6–20)
CO2: 24 mmol/L (ref 22–32)
Calcium: 7.4 mg/dL — ABNORMAL LOW (ref 8.9–10.3)
Chloride: 106 mmol/L (ref 101–111)
Creatinine, Ser: 1.88 mg/dL — ABNORMAL HIGH (ref 0.61–1.24)
GFR calc Af Amer: 44 mL/min — ABNORMAL LOW (ref >60)
GFR calc non Af Amer: 38 mL/min — ABNORMAL LOW (ref >60)
GLUCOSE: 78 mg/dL (ref 65–99)
PHOSPHORUS: 3.4 mg/dL (ref 2.5–4.6)
POTASSIUM: 5.1 mmol/L (ref 3.5–5.1)
Sodium: 137 mmol/L (ref 135–145)

## 2015-11-22 LAB — CBC
HCT: 28.2 % — ABNORMAL LOW (ref 39.0–52.0)
HEMOGLOBIN: 8.8 g/dL — AB (ref 13.0–17.0)
MCH: 31.4 pg (ref 26.0–34.0)
MCHC: 31.2 g/dL (ref 30.0–36.0)
MCV: 100.7 fL — ABNORMAL HIGH (ref 78.0–100.0)
PLATELETS: 50 10*3/uL — AB (ref 150–400)
RBC: 2.8 MIL/uL — AB (ref 4.22–5.81)
RDW: 18.5 % — ABNORMAL HIGH (ref 11.5–15.5)
WBC: 7.5 10*3/uL (ref 4.0–10.5)

## 2015-11-22 LAB — GLUCOSE, CAPILLARY
GLUCOSE-CAPILLARY: 72 mg/dL (ref 65–99)
GLUCOSE-CAPILLARY: 83 mg/dL (ref 65–99)
Glucose-Capillary: 73 mg/dL (ref 65–99)
Glucose-Capillary: 91 mg/dL (ref 65–99)

## 2015-11-22 LAB — APTT: aPTT: 39 seconds — ABNORMAL HIGH (ref 24–37)

## 2015-11-22 LAB — PROTIME-INR
INR: 1.65 — AB (ref 0.00–1.49)
PROTHROMBIN TIME: 19.6 s — AB (ref 11.6–15.2)

## 2015-11-22 LAB — MAGNESIUM: MAGNESIUM: 2.3 mg/dL (ref 1.7–2.4)

## 2015-11-22 MED ORDER — FENTANYL CITRATE (PF) 2500 MCG/50ML IJ SOLN
100.0000 ug/h | INTRAMUSCULAR | Status: DC
  Filled 2015-11-22 (×2): qty 50

## 2015-11-22 MED ORDER — PHENYLEPHRINE HCL 10 MG/ML IJ SOLN: 0.0000 ug/min | INTRAVENOUS | Status: DC

## 2015-11-22 MED ORDER — SODIUM CHLORIDE 0.9 % IV SOLN
100.0000 ug/h | INTRAVENOUS | Status: DC
  Administered 2015-11-22: 400 ug/h via INTRAVENOUS

## 2015-11-22 MED ORDER — FENTANYL BOLUS VIA INFUSION
50.0000 ug | INTRAVENOUS | Status: DC | PRN
  Filled 2015-11-22: qty 200

## 2015-11-22 MED ORDER — SODIUM CHLORIDE 0.9 % IV SOLN
0.0000 mg/h | INTRAVENOUS | Status: DC
  Administered 2015-11-22: 8 mg/h via INTRAVENOUS
  Filled 2015-11-22: qty 10

## 2015-11-22 MED ORDER — VITAL AF 1.2 CAL PO LIQD: 1000.0000 mL | ORAL | Status: DC

## 2015-11-22 MED ORDER — PHENYLEPHRINE HCL 10 MG/ML IJ SOLN
0.0000 ug/min | INTRAMUSCULAR | Status: DC
  Filled 2015-11-22: qty 4

## 2015-11-23 ENCOUNTER — Telehealth: Payer: Self-pay

## 2015-11-23 NOTE — Telephone Encounter (Signed)
On 11/23/2015 I received a death certificate from [facility]. (original). The death certificate is for cremation. The patient is a patient of [facility]. The death certificate will be taken to [facility] (2300) tomorrow am for signature. On 12/12/2015 I received the death certificate back from [place]. I got the death certificate ready and faxed the death certificate over to the funeral home per their request.

## 2015-11-23 NOTE — Anesthesia Postprocedure Evaluation (Signed)
Anesthesia Post Note  Patient: Casey Pennington  Procedure(s) Performed: Procedure(s) (LRB): EXPLORATORY LAPAROTOMY with closure of abdomen (N/A)  Patient location during evaluation: SICU Anesthesia Type: General Level of consciousness: sedated Pain management: pain level controlled Vital Signs Assessment: post-procedure vital signs reviewed and stable Respiratory status: patient on ventilator - see flowsheet for VS and patient remains intubated per anesthesia plan Cardiovascular status: blood pressure returned to baseline and stable Postop Assessment: no signs of nausea or vomiting Anesthetic complications: no Comments: Remains intubated                  Zenaida Deed

## 2015-11-25 NOTE — Progress Notes (Signed)
PCCM Attending Family Discussion Note: Family meeting held with patient's mother, father, son, & ex-wife. Explained his critical illness with ongoing MSOF. Family all agree that he would not wish to continue to live as such and we should proceed with full comfort care. They have discussed this with his other children who concur. We will continue a fentanyl infusion & wean for terminal extubation. We will discontinue his arterial line & all but 1 central venous catheters. Hospital chaplain will be consulted.   Sonia Baller Ashok Cordia, M.D. [facility] Pager:  [phone] After 3pm or if no response, call [phone] 11:17 AM 12-22-15

## 2015-11-25 NOTE — Discharge Summary (Signed)
DEATH NOTE: For complete accounting of the patient's history and physical exam on presentation please refer to the H&P dictated on 11/02/2015. Patient was a 60 year old male with history of hepatitis C cirrhosis, pulmonary hypertension, and HIV as well as polysubstance abuse by history including injected medications. Patient last seen normal 2 days prior to admission by his mother. On 5/17 the friend contacted family describing the patient to be acting strangely and having difficulty breathing. Per report the patient had been on a 2-3 day binge of injecting and inhaling through his nose various amphetamines, opiates, gabapentin, and sildenafil. The patient was febrile and agitated unable to provide further history. The patient continued to clinically deteriorate with worsening septic shock and ischemic small bowel ultimately requiring laparotomy and eventually undergoing repeat abdominal surgery with closure of his abdomen. Patient's renal failure progressively worsened to the point of anuria and resulted in the need for continuous renal replacement therapy. The patient's encephalopathy persisted. He remained ventilator dependent despite attempts at weaning. After a family discussion on 2015/11/30 with multiple family members given his lack of clinical progress and previously expressed wishes the decision was made to transition to full comfort care. He was continued on a fentanyl infusion for relief of pain and dyspnea and terminal extubation was undertaken per protocol. Patient was pronounced dead at 1318 p.m. on 30-Nov-2015 with family at bedside. Patient had pupils that were fixed and dilated. There were no spontaneous respirations or apical heart tones. The patient was noted to be in asystole on telemetry monitoring. Hospital chaplain was available at bedside to provide spiritual supportive family.  DIAGNOSES AT DEATH: 1. Acute hypoxic respiratory failure 2. Acute renal failure 3. Peritonitis 4. Ischemic small  bowel 5. Acute liver injury 6. Sepsis secondary to MRSA bacteremia 7. Escherichia coli UTI 8. Lower gastrointestinal bleeding 9. Anemia 10. Coagulopathy 11. Thrombocytopenia 12. History of HIV infection 13. History of hepatitis C viral infection 14. History of liver cirrhosis with ascites 15. Ileus 16. History of pulmonary hypertension 17. Non-ST elevation myocardial infarction 18. Hyperglycemia 19. Acute encephalopathy 20. Polysubstance abuse

## 2015-11-25 NOTE — Progress Notes (Signed)
PULMONARY / CRITICAL CARE MEDICINE   Name: Casey Pennington MRN: [id number] DOB: 24-Jul-1955    ADMISSION DATE:  10/29/2015  REFERRING MD:  ER  CHIEF COMPLAINT:  Altered mental status  SUBJECTIVE: Patient s/p closure of abdomen. Febrile overnight & this morning.   REVIEW OF SYSTEMS: Unable to obtain as patient is intubated and altered mentation.   VITAL SIGNS: BP 105/48 mmHg  Pulse 118  Temp(Src) 101.7 F (38.7 C) (Core (Comment))  Resp 24  Ht _0  (1.753 m)  Wt 139 lb 5.3 oz (63.2 kg)  BMI 20.57 kg/m2  SpO2 93%  HEMODYNAMICS: CVP:  [8 mmHg-9 mmHg] 8 mmHg  VENTILATOR SETTINGS: Vent Mode:  [-] PRVC FiO2 (%):  [40 %] 40 % Set Rate:  [20 bmp] 20 bmp Vt Set:  [570 mL] 570 mL PEEP:  [5 cmH20] 5 cmH20 Pressure Support:  [5 cmH20] 5 cmH20 Plateau Pressure:  [15 cmH20-17 cmH20] 15 cmH20  INTAKE / OUTPUT: I/O last 3 completed shifts: In: 2314.3 [I.V.:999.3; NG/GT:665; IV Piggyback:650] Out: 2425 [Other:2275; Stool:150]  PHYSICAL EXAMINATION: General: No distress. Sedated. No family present.  Integument:  Warm & dry. No rash on exposed skin. Abdominal dressing is clean and dry. HEENT:  Endotracheal tube in place. Pupils equal. No scleral injection.  Cardiovascular:  Regular rate. Normal S1 & S2. No JVD. No edema.  Pulmonary:  Coarse breath sounds bilaterally unchanged. Symmetric chest wall expansion on ventilator.  Abdomen: Soft. hypoactive bowel sounds. Abdominal dressing in place clean & dry. Neurological:  No withdrawal to pain. Pupils pinpoint. Sedated with Versed & Fentanyl.  LABS:  BMET  Recent Labs Lab 11/21/15 0557 11/21/15 1528 December 22, 2015 0413  NA 137 135 137  K 4.3 4.4 5.1  CL 105 105 106  CO2 _1 BUN 16 14 26*  CREATININE 1.03 0.92 1.88*  GLUCOSE 82 94 78    Electrolytes  Recent Labs Lab 11/20/15 0455  11/21/15 0557 11/21/15 1528 2015/12/22 0413  CALCIUM 7.5*  < > 7.5* 7.4* 7.4*  MG 2.5*  --  2.3  --  2.3  PHOS 2.9  < > 2.5 2.9 3.4  <  > = values in this interval not displayed.  CBC  Recent Labs Lab 11/20/15 0455 11/21/15 0557 12/22/15 0413  WBC 11.3* 6.1 7.5  HGB 9.3* 8.4* 8.8*  HCT 29.2* 26.6* 28.2*  PLT 32* 30* 50*    Coag's  Recent Labs Lab 11/20/15 0455 11/21/15 0557 12/22/15 0413  APTT 36 42* 39*  INR 1.60* 1.54* 1.65*    Sepsis Markers No results for input(s): LATICACIDVEN, PROCALCITON, O2SATVEN in the last 168 hours.  ABG  Recent Labs Lab 11/06/2015 1205  PHART 7.453*  PCO2ART 32.0*  PO2ART 63.3*    Liver Enzymes  Recent Labs Lab 11/17/15 0310  11/18/15 1430  11/20/15 0455  11/21/15 0557 11/21/15 1528 Dec 22, 2015 0413  AST 151*  --  73*  --  48*  --   --   --   --   ALT 327*  --  215*  --  128*  --   --   --   --   ALKPHOS 96  --  105  --  100  --   --   --   --   BILITOT 13.1*  --  12.2*  --  13.5*  --   --   --   --   ALBUMIN 2.1*  < > 2.1*  < > 1.8*  1.8*  < >  1.7* 1.7* 1.7*  < > = values in this interval not displayed.  Cardiac Enzymes No results for input(s): TROPONINI, PROBNP in the last 168 hours.  Glucose  Recent Labs Lab 11/21/15 0737 11/21/15 1155 11/21/15 1522 11/21/15 2022 Dec 09, 2015 0026 12-09-2015 0343  GLUCAP 76 85 87 87 73 72    Imaging No results found.   STUDIES:  5/17 CT abd/pelvis >> pneumatosis SB 5/18 Echo >> EF 55 to 99%, grade 1 diastolic CHF, PAS 47 mmHg 5/22 Port CXR:  Improving right basilar opacity. No new opacity. Lines unchanged in position. 5/22 Port CXR: Endotracheal tube in good position. No new focal opacity or effusion appreciated. 5/26 Port KUB: bowel gas patter with some dilation in small bowel consistent with ileus.   CULTURES: 5/17 Urine >> E coli 5/17 Blood >> MRSA  5/21 Blood >> Negative  ANTIBIOTICS: 5/17 Vancomycin >> Zosyn 5/17 - 5/26 Diflucan 5/18 - 5/26 5/25 Bactrim >>  SIGNIFICANT EVENTS: 5/17 Admit, surgery consulted 5/19 ID consulted, renal consulted >> start CRRT 5/22 Abdominal  Closure  LINES/TUBES: 5/17 ETT >>  5/18 Lt IJ CVL >> 5/18 Lt femoral a line >> 5/19 Rt IJ HD cath >> 5/19 Lt [place] CVL >>  OGT 5/17 >> Foley 5/17 >>  ASSESSMENT / PLAN:  PULMONARY A: Acute Hypoxic Respiratory Failure  P:   Continue weaning FiO2 for saturation greater than 94%  Goal net negative fluid balance  SBT trials as mental status progresses  CARDIOVASCULAR A:  Intermittent Hypotension - Likely due to sedation. NSTEMI Prolonged QTc - 530m 5/18. 5270m5/26. Shock - Likely septic. H/O Pulmonary HTN  P:  Vitals per unit protocol Continue monitoring on telemetry Neosynephrine gtt to maintain MAP >65 & SBP >90  RENAL A:   Acute Renal Failure  P:   Nephrology following Holding on CRRT today Trending electrolytes per Nephrology  GASTROINTESTINAL A:   Ileus Ischemic Small Bowel - S/P Laparotomy. Transaminitis - Improving. Protein calorie malnutrition. Hep C with cirrhosis Rectal bleeding - Likely mucosal irritation from rectal tube in setting of thrombocytopenia. Resolved.  P:   NPO Protonix IV daily Post op care per CCS Trickle tube feedings Trending LFTs daily  HEMATOLOGIC A:   GI Bleeding - Likely post op & some oozing from rectal tube irritation in setting of thrombocytopenia. Coagulopathy - Mild & fibrinogen >100. Anemia - Hgb slightly worse today. Heme+ stool. Thrombocytopenia - Likely combination of sepsis & hepatic cirrhosis. S/P 1u Platelets 5/24. Stable.  P:  Trending Cell Counts daily w/ CBC Trending Coags daily SCDs  INFECTIOUS A:   FUO 5/29 Sepsis MRSA bacteremia - Blood Ctx 5/21 negative. E coli UTI Peritonitis H/O HIV & Hep C - CD4 120 on 11/21/2015.  P:   Infectious Diseases Following Continuing Vancomycin Day #12 total Consider TEE depending on family wishes Checking Port CXR, Blood Ctx, & Tracheal Asp Ctx given fever today  ENDOCRINE A:   Hyperglycemia - Possibly secondary to infection & steroids. Controlled.  P:    Accu-Checks q4hr Low dose SSI per algorithm  NEUROLOGIC A:   Acute Encephalopathy - Metabolic vs Hepatic Encephalopathy vs Delirium. Not improving.  P:   RASS Goal: 0 to -1 Fentanyl gtt & IV prn Versed IV prn Thiamine 50083m8hr IV x3 days FA IV daily Holding on Lactulose (Ammonia 32) Unable to use Haldol/Seroquel/Zyprexa for QTc prolonged  FAMILY UPDATE:  Mother & Father updated by Dr. NesAshok Cordia26 at bedside & 5/28 via phone. Planning for family meeting today.  TODAY'S SUMMARY:  60 yo male with h/o IVDA, HIV, Hep C with cirrhosis, Pulmonary HTN. Now with ischemic SB with peritonitis, septic shock NSTEMI, AKI, VDRF, MRSA bacteremia after binge of illicit substances. Patient had hypotension after Precedex infusion. Transitioning off CVVHD today. Plan for family meeting tomorrow to discuss goals of care.   I have spent a total of 34 minutes of critical care time today caring for the patient and reviewing the patient's electronic medical record.  Sonia Baller Ashok Cordia, M.D. [facility] Pager:  [phone] After 3pm or if no response, call [phone]  2015/12/13, 6:48 AM

## 2015-11-25 NOTE — Progress Notes (Signed)
Patient ID: Casey Pennington, male   DOB: 07-04-1955, 60 y.o.   MRN: [id number]     [place]      [address] [place], [place] [address]    Phone: [phone] FAX: [phone]     Subjective: t max 101.8.  Wbc normal.  Tolerated TF.   Objective:  Vital signs:  Filed Vitals:   12-02-15 0730 02-Dec-2015 0800 2015/12/02 0830 12/02/2015 0900  BP:      Pulse: 120 124 118 118  Temp: 101.8 F (38.8 C) 101.8 F (38.8 C) 101.7 F (38.7 C) 101.5 F (38.6 C)  TempSrc:      Resp: 31 32 26 30  Height:      Weight:      SpO2: 93% 93% 91% 91%    Last BM Date: Dec 02, 2015  Intake/Output   Yesterday:  05/28 0701 - 05/29 0700 In: 1621.1 [I.V.:856.1; NG/GT:465; IV Piggyback:300] Out: 6606 [Urine:10; Stool:200] This shift:  Total I/O In: 136 [I.V.:116; NG/GT:20] Out: 10 [Urine:10]  Physical Exam: General: Pt awake, intubated, does not follow commands.   Abdomen: Soft. Nondistended. Non tender. Midline wound-dressing is dry. No evidence of peritonitis. No incarcerated hernias.     Problem List:   Principal Problem:   Rhabdomyolysis Active Problems:   HIV disease ([place])   Pulmonary HTN ([place])   Chronic pain   BPH (benign prostatic hyperplasia)   Cirrhosis of liver without ascites (H[facility])   Chronic hepatitis C ([place])   AKI (acute kidney injury) ([place])   Drug intoxication ([place])   Polysubstance abuse   Thrombocytopenia ([place])   Relative polycythemia   Pneumatosis intestinalis   Septic shock ([facility])   Acute respiratory failure ([place])   Acute encephalopathy    Results:   Labs: Results for orders placed or performed during the hospital encounter of 11/18/2015 (from the past 48 hour(s))  Glucose, capillary     Status: Abnormal   Collection Time: 11/20/15 11:38 AM  Result Value Ref Range   Glucose-Capillary 135 (H) 65 - 99 mg/dL  Renal function panel (daily at 1600)     Status: Abnormal   Collection Time: 11/20/15  3:34 PM   Result Value Ref Range   Sodium 134 (L) 135 - 145 mmol/L   Potassium 4.2 3.5 - 5.1 mmol/L   Chloride 106 101 - 111 mmol/L   CO2 23 22 - 32 mmol/L   Glucose, Bld 106 (H) 65 - 99 mg/dL   BUN 17 6 - 20 mg/dL   Creatinine, Ser 1.13 0.61 - 1.24 mg/dL   Calcium 7.2 (L) 8.9 - 10.3 mg/dL   Phosphorus 3.0 2.5 - 4.6 mg/dL   Albumin 1.7 (L) 3.5 - 5.0 g/dL   GFR calc non Af Amer >60 >60 mL/min   GFR calc Af Amer >60 >60 mL/min    Comment: (NOTE) The eGFR has been calculated using the CKD EPI equation. This calculation has not been validated in all clinical situations. eGFR's persistently <60 mL/min signify possible Chronic Kidney Disease.    Anion gap 5 5 - 15  Glucose, capillary     Status: None   Collection Time: 11/20/15  3:50 PM  Result Value Ref Range   Glucose-Capillary 95 65 - 99 mg/dL  Glucose, capillary     Status: None   Collection Time: 11/20/15  8:42 PM  Result Value Ref Range   Glucose-Capillary 97 65 - 99 mg/dL  Glucose, capillary     Status: None  Collection Time: 11/21/15 12:25 AM  Result Value Ref Range   Glucose-Capillary 72 65 - 99 mg/dL  Glucose, capillary     Status: None   Collection Time: 11/21/15  4:39 AM  Result Value Ref Range   Glucose-Capillary 79 65 - 99 mg/dL  Magnesium     Status: None   Collection Time: 11/21/15  5:57 AM  Result Value Ref Range   Magnesium 2.3 1.7 - 2.4 mg/dL  CBC     Status: Abnormal   Collection Time: 11/21/15  5:57 AM  Result Value Ref Range   WBC 6.1 4.0 - 10.5 K/uL   RBC 2.61 (L) 4.22 - 5.81 MIL/uL   Hemoglobin 8.4 (L) 13.0 - 17.0 g/dL   HCT 26.6 (L) 39.0 - 52.0 %   MCV 101.9 (H) 78.0 - 100.0 fL   MCH 32.2 26.0 - 34.0 pg   MCHC 31.6 30.0 - 36.0 g/dL   RDW 18.1 (H) 11.5 - 15.5 %   Platelets 30 (L) 150 - 400 K/uL    Comment: CONSISTENT WITH PREVIOUS RESULT  Protime-INR     Status: Abnormal   Collection Time: 11/21/15  5:57 AM  Result Value Ref Range   Prothrombin Time 18.5 (H) 11.6 - 15.2 seconds   INR 1.54 (H) 0.00 -  1.49  APTT     Status: Abnormal   Collection Time: 11/21/15  5:57 AM  Result Value Ref Range   aPTT 42 (H) 24 - 37 seconds    Comment:        IF BASELINE aPTT IS ELEVATED, SUGGEST PATIENT RISK ASSESSMENT BE USED TO DETERMINE APPROPRIATE ANTICOAGULANT THERAPY.   Renal function panel     Status: Abnormal   Collection Time: 11/21/15  5:57 AM  Result Value Ref Range   Sodium 137 135 - 145 mmol/L   Potassium 4.3 3.5 - 5.1 mmol/L   Chloride 105 101 - 111 mmol/L   CO2 26 22 - 32 mmol/L   Glucose, Bld 82 65 - 99 mg/dL   BUN 16 6 - 20 mg/dL   Creatinine, Ser 1.03 0.61 - 1.24 mg/dL   Calcium 7.5 (L) 8.9 - 10.3 mg/dL   Phosphorus 2.5 2.5 - 4.6 mg/dL   Albumin 1.7 (L) 3.5 - 5.0 g/dL   GFR calc non Af Amer >60 >60 mL/min   GFR calc Af Amer >60 >60 mL/min    Comment: (NOTE) The eGFR has been calculated using the CKD EPI equation. This calculation has not been validated in all clinical situations. eGFR's persistently <60 mL/min signify possible Chronic Kidney Disease.    Anion gap 6 5 - 15  Glucose, capillary     Status: None   Collection Time: 11/21/15  7:37 AM  Result Value Ref Range   Glucose-Capillary 76 65 - 99 mg/dL  Glucose, capillary     Status: None   Collection Time: 11/21/15 11:55 AM  Result Value Ref Range   Glucose-Capillary 85 65 - 99 mg/dL  Glucose, capillary     Status: None   Collection Time: 11/21/15  3:22 PM  Result Value Ref Range   Glucose-Capillary 87 65 - 99 mg/dL  Renal function panel (daily at 1600)     Status: Abnormal   Collection Time: 11/21/15  3:28 PM  Result Value Ref Range   Sodium 135 135 - 145 mmol/L   Potassium 4.4 3.5 - 5.1 mmol/L   Chloride 105 101 - 111 mmol/L   CO2 24 22 - 32 mmol/L  Glucose, Bld 94 65 - 99 mg/dL   BUN 14 6 - 20 mg/dL   Creatinine, Ser 0.92 0.61 - 1.24 mg/dL   Calcium 7.4 (L) 8.9 - 10.3 mg/dL   Phosphorus 2.9 2.5 - 4.6 mg/dL   Albumin 1.7 (L) 3.5 - 5.0 g/dL   GFR calc non Af Amer >60 >60 mL/min   GFR calc Af Amer >60  >60 mL/min    Comment: (NOTE) The eGFR has been calculated using the CKD EPI equation. This calculation has not been validated in all clinical situations. eGFR's persistently <60 mL/min signify possible Chronic Kidney Disease.    Anion gap 6 5 - 15  Glucose, capillary     Status: None   Collection Time: 11/21/15  8:22 PM  Result Value Ref Range   Glucose-Capillary 87 65 - 99 mg/dL  Glucose, capillary     Status: None   Collection Time: 2015-11-24 12:26 AM  Result Value Ref Range   Glucose-Capillary 73 65 - 99 mg/dL  Glucose, capillary     Status: None   Collection Time: 24-Nov-2015  3:43 AM  Result Value Ref Range   Glucose-Capillary 72 65 - 99 mg/dL  Magnesium     Status: None   Collection Time: 11/24/15  4:13 AM  Result Value Ref Range   Magnesium 2.3 1.7 - 2.4 mg/dL  CBC     Status: Abnormal   Collection Time: 11-24-2015  4:13 AM  Result Value Ref Range   WBC 7.5 4.0 - 10.5 K/uL   RBC 2.80 (L) 4.22 - 5.81 MIL/uL   Hemoglobin 8.8 (L) 13.0 - 17.0 g/dL   HCT 28.2 (L) 39.0 - 52.0 %   MCV 100.7 (H) 78.0 - 100.0 fL   MCH 31.4 26.0 - 34.0 pg   MCHC 31.2 30.0 - 36.0 g/dL   RDW 18.5 (H) 11.5 - 15.5 %   Platelets 50 (L) 150 - 400 K/uL    Comment: CONSISTENT WITH PREVIOUS RESULT  Protime-INR     Status: Abnormal   Collection Time: Nov 24, 2015  4:13 AM  Result Value Ref Range   Prothrombin Time 19.6 (H) 11.6 - 15.2 seconds   INR 1.65 (H) 0.00 - 1.49  APTT     Status: Abnormal   Collection Time: 2015-11-24  4:13 AM  Result Value Ref Range   aPTT 39 (H) 24 - 37 seconds    Comment:        IF BASELINE aPTT IS ELEVATED, SUGGEST PATIENT RISK ASSESSMENT BE USED TO DETERMINE APPROPRIATE ANTICOAGULANT THERAPY.   Renal function panel     Status: Abnormal   Collection Time: 11-24-2015  4:13 AM  Result Value Ref Range   Sodium 137 135 - 145 mmol/L   Potassium 5.1 3.5 - 5.1 mmol/L   Chloride 106 101 - 111 mmol/L   CO2 24 22 - 32 mmol/L   Glucose, Bld 78 65 - 99 mg/dL   BUN 26 (H) 6 - 20 mg/dL    Creatinine, Ser 1.88 (H) 0.61 - 1.24 mg/dL   Calcium 7.4 (L) 8.9 - 10.3 mg/dL   Phosphorus 3.4 2.5 - 4.6 mg/dL   Albumin 1.7 (L) 3.5 - 5.0 g/dL   GFR calc non Af Amer 38 (L) >60 mL/min   GFR calc Af Amer 44 (L) >60 mL/min    Comment: (NOTE) The eGFR has been calculated using the CKD EPI equation. This calculation has not been validated in all clinical situations. eGFR's persistently <60 mL/min signify possible Chronic Kidney Disease.  Anion gap 7 5 - 15    Imaging / Studies: No results found.  Medications / Allergies:  Scheduled Meds: . abacavir  600 mg Per Tube Daily  . antiseptic oral rinse  7 mL Mouth Rinse QID  . chlorhexidine gluconate (SAGE KIT)  15 mL Mouth Rinse BID  . dolutegravir  50 mg Oral Daily  . [START ON 11/23/2015] feeding supplement (VITAL AF 1.2 CAL)  1,000 mL Per Tube Q24H  . insulin aspart  0-9 Units Subcutaneous Q4H  . lamiVUDine  50 mg Per Tube Daily  . pantoprazole sodium  40 mg Per Tube Daily  . sodium chloride flush  10-40 mL Intracatheter Q12H  . sulfamethoxazole-trimethoprim  20 mL Per Tube Daily  . thiamine IV  500 mg Intravenous Q8H   Continuous Infusions: . fentaNYL infusion INTRAVENOUS 400 mcg/hr (December 10, 2015 0700)  . midazolam (VERSED) infusion 8 mg/hr (2015-12-10 0700)  . norepinephrine (LEVOPHED) Adult infusion Stopped (11/23/2015 1800)   PRN Meds:.sodium chloride, fentaNYL, heparin, midazolam, [DISCONTINUED] ondansetron **OR** ondansetron (ZOFRAN) IV, sodium chloride flush  Antibiotics: Anti-infectives    Start     Dose/Rate Route Frequency Ordered Stop   Dec 10, 2015 1000  lamiVUDine (EPIVIR) 10 MG/ML solution 50 mg     50 mg Per Tube Daily 11/21/15 1236     11/19/15 1200  lamiVUDine (EPIVIR) 10 MG/ML solution 150 mg  Status:  Discontinued     150 mg Per Tube Daily 11/19/15 1048 11/21/15 1236   11/19/15 1200  abacavir (ZIAGEN) tablet 600 mg     600 mg Per Tube Daily 11/19/15 1048     11/19/15 1200  dolutegravir (TIVICAY) tablet 50 mg      50 mg Oral Daily 11/19/15 1048     11/18/15 1430  sulfamethoxazole-trimethoprim (BACTRIM,SEPTRA) 200-40 MG/5ML suspension 20 mL     20 mL Per Tube Daily 11/18/15 1415     11/18/15 1000  vancomycin (VANCOCIN) IVPB 1000 mg/200 mL premix  Status:  Discontinued     1,000 mg 200 mL/hr over 60 Minutes Intravenous Every 24 hours 11/17/15 1239 11/21/15 1232   11/18/15 0500  piperacillin-tazobactam (ZOSYN) IVPB 2.25 g  Status:  Discontinued     2.25 g 100 mL/hr over 30 Minutes Intravenous Every 6 hours 11/18/15 0453 11/19/15 1035   11/04/2015 0600  fluconazole (DIFLUCAN) IVPB 400 mg  Status:  Discontinued     400 mg 100 mL/hr over 120 Minutes Intravenous Daily 11/14/15 0913 11/19/15 1035   11/06/2015 1000  vancomycin (VANCOCIN) IVPB 750 mg/150 ml premix  Status:  Discontinued     750 mg 150 mL/hr over 60 Minutes Intravenous Every 24 hours 11/12/15 1343 11/17/15 1239   11/12/15 2100  vancomycin (VANCOCIN) IVPB 1000 mg/200 mL premix  Status:  Discontinued     1,000 mg 200 mL/hr over 60 Minutes Intravenous Every 48 hours 11/17/2015 0622 11/12/15 0940   11/12/15 1400  piperacillin-tazobactam (ZOSYN) IVPB 2.25 g  Status:  Discontinued     2.25 g 100 mL/hr over 30 Minutes Intravenous Every 6 hours 11/12/15 1343 11/18/15 0453   11/12/15 1030  vancomycin (VANCOCIN) 1,250 mg in sodium chloride 0.9 % 250 mL IVPB     1,250 mg 166.7 mL/hr over 90 Minutes Intravenous  Once 11/12/15 0940 11/12/15 1256   10/28/2015 2000  vancomycin (VANCOCIN) IVPB 750 mg/150 ml premix  Status:  Discontinued     750 mg 150 mL/hr over 60 Minutes Intravenous Every 24 hours 11/08/2015 2128 10/29/2015 0622   11/08/2015 1400  piperacillin-tazobactam (  ZOSYN) IVPB 2.25 g  Status:  Discontinued     2.25 g 100 mL/hr over 30 Minutes Intravenous Every 8 hours 11/01/2015 0622 11/12/15 1343   10/26/2015 1000  abacavir-dolutegravir-lamiVUDine (TRIUMEQ) 600-50-300 MG per tablet 1 tablet  Status:  Discontinued     1 tablet Oral Daily 11/21/2015 2112 11/09/2015  0517   10/28/2015 0700  fluconazole (DIFLUCAN) IVPB 200 mg  Status:  Discontinued     200 mg 100 mL/hr over 60 Minutes Intravenous Daily 11/04/2015 0621 11/14/15 0913   11/04/2015 0600  piperacillin-tazobactam (ZOSYN) IVPB 3.375 g  Status:  Discontinued     3.375 g 12.5 mL/hr over 240 Minutes Intravenous Every 8 hours 11/07/2015 2128 10/28/2015 0622   10/31/2015 2130  vancomycin (VANCOCIN) IVPB 1000 mg/200 mL premix     1,000 mg 200 mL/hr over 60 Minutes Intravenous  Once 10/30/2015 2112 11/18/2015 2302   11/04/2015 2115  piperacillin-tazobactam (ZOSYN) IVPB 3.375 g     3.375 g 100 mL/hr over 30 Minutes Intravenous  Once 11/14/2015 2112 11/06/2015 2216   11/05/2015 2000  piperacillin-tazobactam (ZOSYN) IVPB 3.375 g  Status:  Discontinued    Comments:  Give as soon as cultures drawn   3.375 g 100 mL/hr over 30 Minutes Intravenous  Once 11/04/2015 1953 10/25/2015 2123   11/21/2015 2000  vancomycin (VANCOCIN) IVPB 1000 mg/200 mL premix  Status:  Discontinued    Comments:  Give as soon as cultures drawn   1,000 mg 200 mL/hr over 60 Minutes Intravenous  Once 11/24/2015 1953 10/31/2015 2123       Assessment/Plan: 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 Dennis Bast 11/20/2015 laparotomy, small bowel resection, open abdomen vac---Dr. Kieth Brightly 10/30/2015 exploratory laparotomy, ileocoic anastomosis, closure of abdomen---Dr. Ninfa Linden -advance TF to 32m/hr.  Goal of 40. -with ongoing fevers, ?check CT of abdomen/pelvis -continue BID Wet to dry dressing changes  ID-MRSA bacteremia, HIV, hep C, e coli UTI. Infectious disease following resp-on vent Cirrhosis-hep C, EtOH Acute renal failure-CRRT, renal following CV-NSTEMI, stock  EUSAA [facility]   [id number] AM

## 2015-11-25 NOTE — Progress Notes (Signed)
Pt is DNR; family at bedside; pupils fixed and dilated; no spontaneous respirations; no apical heart tones; asystolic in EKG leads x 2; findings verified with Daron Offer, RN; Dr Ashok Cordia notified; pt pronounced at 1318.  Lenor Coffin, RN

## 2015-11-25 NOTE — Progress Notes (Signed)
Sputum sample obtained and sent down to main lab without complications.

## 2015-11-25 NOTE — Progress Notes (Signed)
2015/11/27 1300  Clinical Encounter Type  Visited With Family  Visit Type Patient actively dying;Initial;Follow-up;Spiritual support;Psychological support  Referral From Physician  Spiritual Encounters  Spiritual Needs Emotional;Grief support;Prayer  Ch requested to offer spiritual care and support for family in preparation for terminal extubation and comfort care; [place] offered grief, emotional and spiritual support as well as prayer for family; [place] available to offer additional support as needed. Gwynn Burly .nwo

## 2015-11-25 NOTE — Progress Notes (Signed)
Pt previously on Fentanyl and Versed drips; 225 ml of 250 ml bag of Fentanyl (10 mcg/ml) and 30 ml of 50 ml bag of Versed (1 mg/ml) wasted in sink; witnessed by Daron Offer, RN.  Lenor Coffin, RN

## 2015-11-25 NOTE — Progress Notes (Signed)
Patient was extubated for "withdrawal of life" protocol orders.  Patient tolerated well.  No complications.

## 2015-11-25 NOTE — Progress Notes (Signed)
Subjective: Interval History: on vent, not coop,   Objective: Vital signs in last 24 hours: Temp:  [93.6 F (34.2 C)-101.7 F (38.7 C)] 101.7 F (38.7 C) (05/29 0500) Pulse Rate:  [76-120] 118 (05/29 0500) Resp:  [14-29] 24 (05/29 0500) BP: (94-123)/(45-57) 105/48 mmHg (05/29 0323) SpO2:  [92 %-100 %] 93 % (05/29 0500) Arterial Line BP: (91-131)/(42-62) 97/46 mmHg (05/29 0500) FiO2 (%):  [40 %] 40 % (05/29 0323) Weight:  [63.2 kg (139 lb 5.3 oz)] 63.2 kg (139 lb 5.3 oz) (05/29 0500) Weight change: 0.8 kg (1 lb 12.2 oz)  Intake/Output from previous day: 05/28 0701 - 05/29 0700 In: 1330.1 [I.V.:745.1; NG/GT:335; IV Piggyback:250] Out: 7989 [Urine:10; Stool:200] Intake/Output this shift: Total I/O In: 409.1 [I.V.:409.1] Out: 210 [Urine:10; Stool:200]  General appearance: 0n vent, pale Resp: rales RLL and rhonchi anterior - right Cardio: S1, S2 normal and Rate 120s GI: soft, non-tender; bowel sounds normal; no masses,  no organomegaly and dressing midline, few bs, soft Extremities: 1+ edema, L fem HD cath  Lab Results:  Recent Labs  11/21/15 0557 11/23/2015 0413  WBC 6.1 7.5  HGB 8.4* 8.8*  HCT 26.6* 28.2*  PLT 30* 50*   BMET:  Recent Labs  11/21/15 1528 Nov 23, 2015 0413  NA 135 137  K 4.4 5.1  CL 105 106  CO2 24 24  GLUCOSE 94 78  BUN 14 26*  CREATININE 0.92 1.88*  CALCIUM 7.4* 7.4*   No results for input(s): PTH in the last 72 hours. Iron Studies: No results for input(s): IRON, TIBC, TRANSFERRIN, FERRITIN in the last 72 hours.  Studies/Results: No results found.  I have reviewed the patient's current medications.  Assessment/Plan: 1 AKI hemodynamic and rhabdo, oligoanuric. Acid/base/K solute ok.  K rising.  Hold off RRT today. With low bps suspect will return to CRRT. 2 Sepsis multiple AB. Now septra and Vanco , still febrile 3 Anemia 4 VDRF 5 AMS no change 6 HIV  Per ID 7 thrombocytopenia 8 Multiple abdm procedures per surgery P follow chem,  anticipate return to CRRT, limit fluids, cont Vent, AB, antivirals   LOS: 12 days   Reinaldo Helt L 2015/11/23,6:47 AM

## 2015-11-25 DEATH — deceased

## 2015-12-01 ENCOUNTER — Telehealth: Payer: Self-pay

## 2015-12-01 NOTE — Telephone Encounter (Signed)
On 12/01/2015 I received a death certificate from [facility]. (orginal). The death certificate is for cremation. The patient is a patient of [facility]. The death certificate will be taken to Pulmonary Unit @ [facility] for signature. On 12/12/2015 I received the death certificate back from [place]. I got the death certificate ready and called the funeral home to let them know I mailed the death certificate to the [facility] per the funeral home request.

## 2017-02-11 IMAGING — CR DG ABD PORTABLE 1V
1 series · 1 of 1 positions shown · non-contrast
Comparison: 11/10/2015

CLINICAL DATA: Emesis

EXAM:
PORTABLE ABDOMEN - 1 VIEW

[AP]
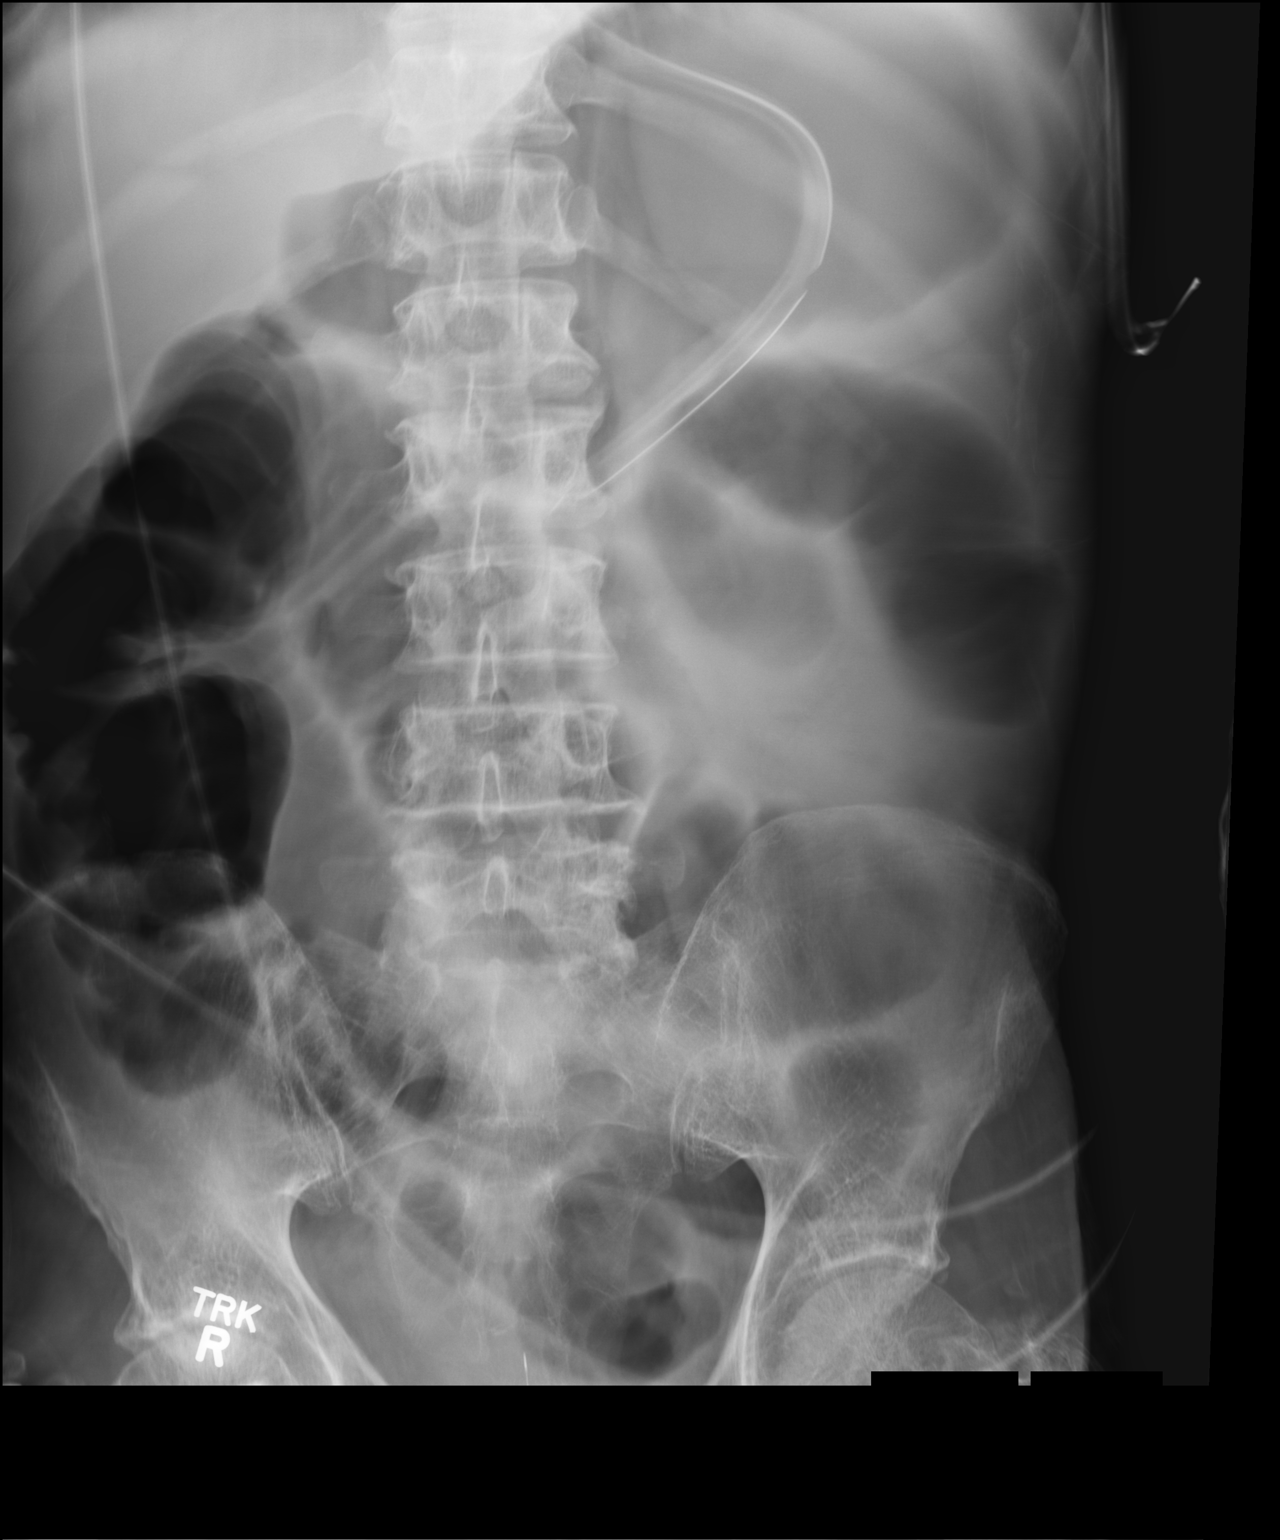

[1 of 1 positions shown; findings below may reference images not displayed]

FINDINGS: Scattered large and small bowel gas is noted. Mild small bowel
dilatation is seen. This is increased somewhat in the interval from
the prior exam. This may represent a postoperative ileus.
Correlation with the physical exam is recommended. A nasogastric
catheter is seen within the stomach. No acute bony abnormality is
noted.
IMPRESSION: Small bowel dilatation likely representing postoperative ileus.
Correlation with the physical exam is recommended.

## 2017-02-14 IMAGING — DX DG CHEST 1V PORT
1 series · 1 of 1 positions shown · non-contrast
Comparison: 11/15/2015

CLINICAL DATA: Fever of unknown origin

EXAM:
PORTABLE CHEST 1 VIEW

[chest ap]
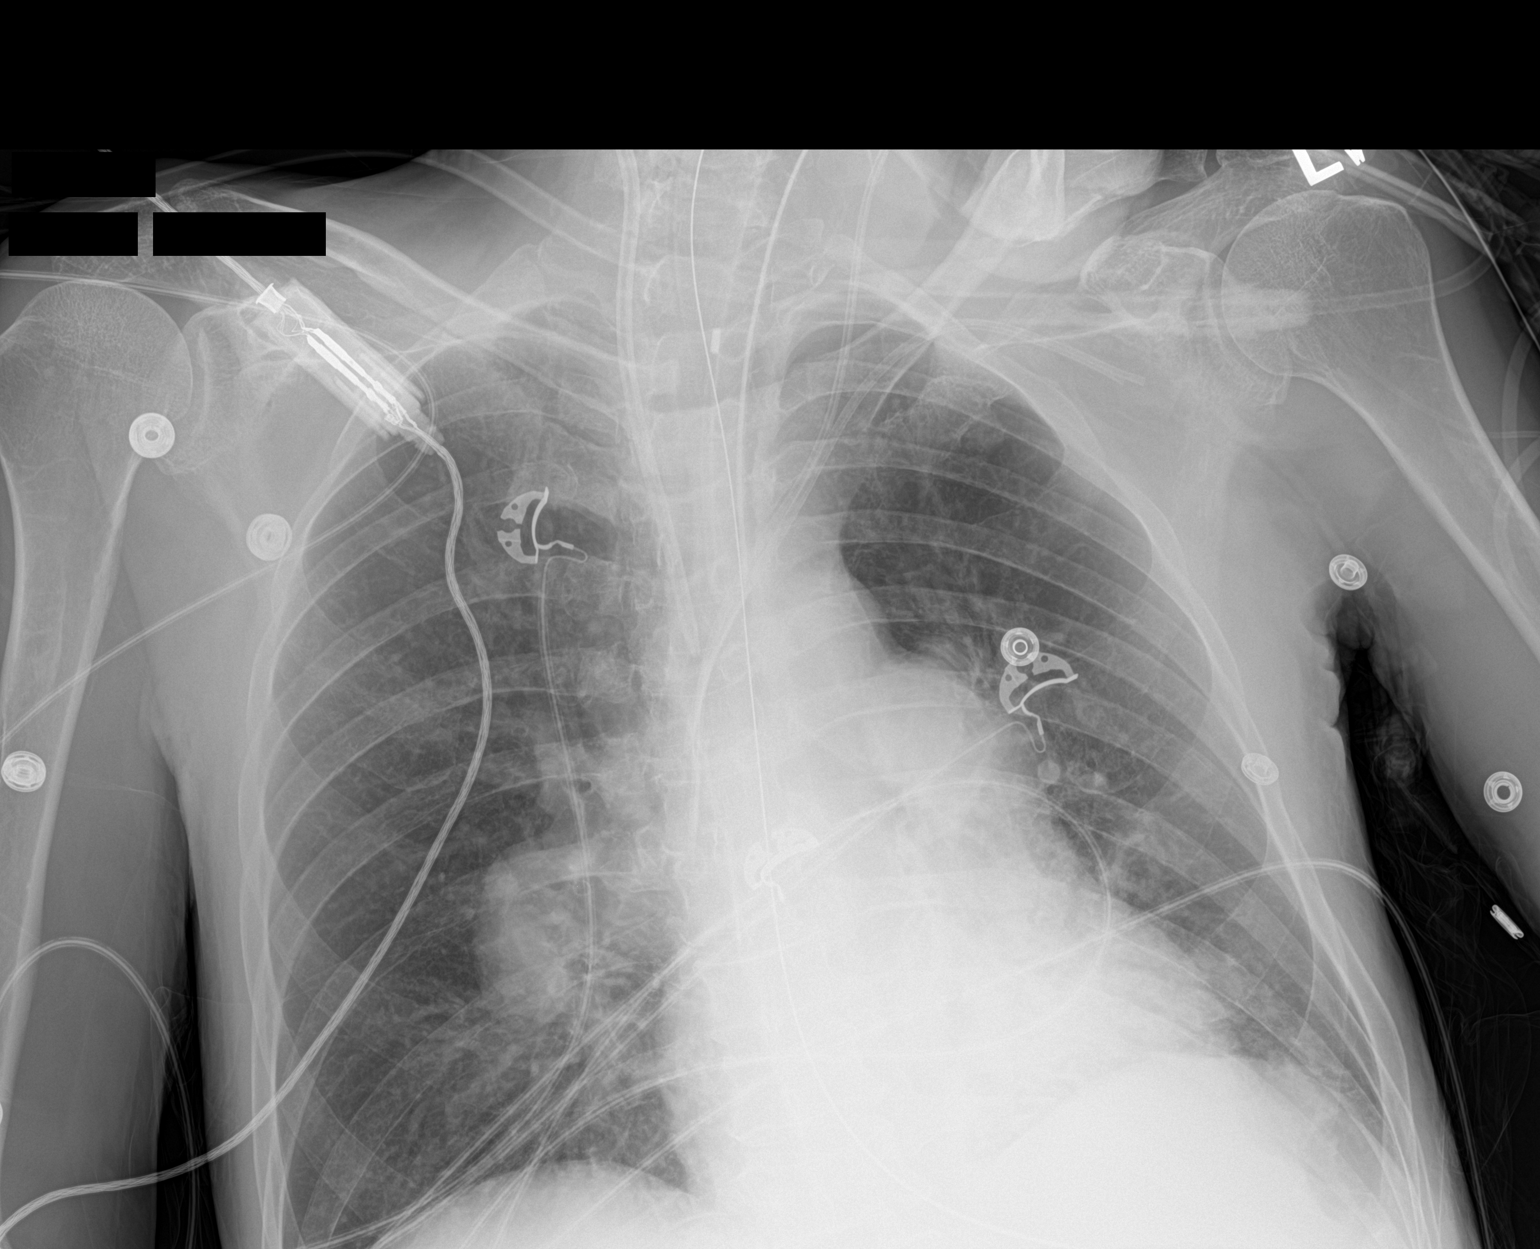

[1 of 1 positions shown; findings below may reference images not displayed]

FINDINGS: Endotracheal tube with the tip 5.2 cm above the carina. Left
subclavian central venous catheter with the tip projecting over the
SVC. Right jugular central venous sheath projecting over the upper
SVC.

Nasogastric tube coursing below the diaphragm.

Mild hazy left lower lobe retrocardiac airspace disease. No pleural
effusion or pneumothorax. Stable heart size. Enlarged right central
pulmonary vasculature.

The osseous structures are unremarkable.
IMPRESSION: 1. Support lines and tubing are in stable position.
2. Mild hazy left lower lobe retrocardiac airspace disease which may
reflect atelectasis versus pneumonia.
